# Patient Record
Sex: Male | Born: 1980
Health system: Southern US, Community
[De-identification: ages and names within clinical notes are randomized; demographics above are authoritative.]

## PROBLEM LIST (undated history)

## (undated) ENCOUNTER — Ambulatory Visit (HOSPITAL_COMMUNITY): Admission: EM | Disposition: A | Discharge status: Home / Self Care | Payer: Commercial Managed Care - PPO

## (undated) DIAGNOSIS — Z973 Presence of spectacles and contact lenses: Secondary | ICD-10-CM

## (undated) DIAGNOSIS — K8020 Calculus of gallbladder without cholecystitis without obstruction: Secondary | ICD-10-CM

## (undated) DIAGNOSIS — E079 Disorder of thyroid, unspecified: Secondary | ICD-10-CM

## (undated) DIAGNOSIS — I10 Essential (primary) hypertension: Secondary | ICD-10-CM

## (undated) DIAGNOSIS — G4733 Obstructive sleep apnea (adult) (pediatric): Secondary | ICD-10-CM

## (undated) DIAGNOSIS — T7840XA Allergy, unspecified, initial encounter: Secondary | ICD-10-CM

## (undated) DIAGNOSIS — L853 Xerosis cutis: Secondary | ICD-10-CM

## (undated) DIAGNOSIS — I509 Heart failure, unspecified: Secondary | ICD-10-CM

## (undated) HISTORY — PX: APPENDECTOMY: SHX54

## (undated) HISTORY — DX: Allergy, unspecified, initial encounter: T78.40XA

## (undated) HISTORY — DX: Disorder of thyroid, unspecified: E07.9

## (undated) HISTORY — DX: Presence of spectacles and contact lenses: Z97.3

## (undated) HISTORY — DX: Xerosis cutis: L85.3

## (undated) HISTORY — DX: Obstructive sleep apnea (adult) (pediatric): G47.33

---

## 2005-10-03 ENCOUNTER — Emergency Department (HOSPITAL_COMMUNITY): Admission: EM | Admit: 2005-10-03 | Discharge: 2005-10-03 | Payer: Self-pay | Admitting: *Deleted

## 2012-11-12 ENCOUNTER — Ambulatory Visit: Payer: Self-pay | Admitting: Family Medicine

## 2012-11-12 ENCOUNTER — Ambulatory Visit: Payer: Self-pay

## 2012-11-12 VITALS — BP 120/80 | HR 62 | Temp 98.2°F | Resp 16 | Ht 65.0 in | Wt 192.2 lb

## 2012-11-12 DIAGNOSIS — R110 Nausea: Secondary | ICD-10-CM

## 2012-11-12 DIAGNOSIS — R109 Unspecified abdominal pain: Secondary | ICD-10-CM

## 2012-11-12 DIAGNOSIS — R1011 Right upper quadrant pain: Secondary | ICD-10-CM

## 2012-11-12 DIAGNOSIS — K5900 Constipation, unspecified: Secondary | ICD-10-CM

## 2012-11-12 LAB — POCT CBC
Granulocyte percent: 86.8 %G — AB (ref 37–80)
HCT, POC: 48.8 % (ref 43.5–53.7)
Hemoglobin: 15.9 g/dL (ref 14.1–18.1)
MCHC: 32.6 g/dL (ref 31.8–35.4)
MCV: 95.2 fL (ref 80–97)
POC MID %: 4 %M (ref 0–12)
Platelet Count, POC: 262 10*3/uL (ref 142–424)
RBC: 5.13 M/uL (ref 4.69–6.13)

## 2012-11-12 LAB — POCT URINALYSIS DIPSTICK
Bilirubin, UA: NEGATIVE
Glucose, UA: NEGATIVE
Ketones, UA: NEGATIVE
Spec Grav, UA: 1.025
Urobilinogen, UA: 0.2
pH, UA: 8.5

## 2012-11-12 LAB — COMPREHENSIVE METABOLIC PANEL
AST: 74 U/L — ABNORMAL HIGH (ref 0–37)
Albumin: 5.1 g/dL (ref 3.5–5.2)
CO2: 30 mEq/L (ref 19–32)
Glucose, Bld: 140 mg/dL — ABNORMAL HIGH (ref 70–99)
Sodium: 141 mEq/L (ref 135–145)
Total Bilirubin: 0.5 mg/dL (ref 0.3–1.2)
Total Protein: 8.3 g/dL (ref 6.0–8.3)

## 2012-11-12 LAB — POCT UA - MICROSCOPIC ONLY
Crystals, Ur, HPF, POC: NEGATIVE
WBC, Ur, HPF, POC: NEGATIVE

## 2012-11-12 LAB — LIPASE: Lipase: 10 U/L (ref 0–75)

## 2012-11-12 MED ORDER — HYDROCODONE-ACETAMINOPHEN 5-325 MG PO TABS: 1.0000 | ORAL_TABLET | ORAL | Status: DC | PRN

## 2012-11-12 MED ORDER — PROMETHAZINE HCL 25 MG PO TABS: 25.0000 mg | ORAL_TABLET | Freq: Four times a day (QID) | ORAL | Status: DC | PRN

## 2012-11-12 NOTE — Patient Instructions (Addendum)
Take MiraLax one dose daily until stools are consistently loose, then take it on an as-needed basis  Drink plenty of fluids. Avoid alcohol, fried and fatty foods, and heavy meals for the next few days.  For the long term for your bowels try to add fruits and vegetables and especially leafy green vegetables to your diet. Cheese and bananas tend to constipate  Take the hydrocodone one every 4-6 hours if needed for bad pain. Remember that it can constipate you also, so use only when needed.  Take promethazine 25 mg one every 6 hours when needed for nausea and vomiting. Please be aware that it will make you drowsy.  In the event of more severe pain, fever, persistent vomiting, passing of blood in the stool or urine, or other major concerns return for recheck

## 2012-11-12 NOTE — Progress Notes (Signed)
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 He woke up in the night at about 4 AM with acute right flank pain. There is also some pain around in the right upper abdomen with nausea and vomiting. He took a laxative. He awakened with the pain couple times. He continues to hurt to the degree that he cried with it. He ran on Saturday. He did have about 3 drinks of wine last night. He said he has had a milder amount of this pain in the past, wonders whether it was associated with his drinking. His only abdominal surgery was a appendectomy.  Objective: Healthy-appearing young man in is some discomfort. He's got mild to moderate right CVA tenderness. Abdomen has occasional bowel sound present. It is soft, with mild to moderate tenderness in the right upper quadrant. Chest clear. Heart regular.  Assessment: Right flank and right epigastric and right upper quadrant pain with nausea and vomiting  Plan: Check CBC, urinalysis, and two-view abdomen. If not clear answer as to his problem we will also send off a C. met and amylase  Results for orders placed in visit on 11/12/12  POCT CBC      Result Value Range   WBC 10.4 (*) 4.6 - 10.2 K/uL   Lymph, poc 1.0  0.6 - 3.4   POC LYMPH PERCENT 9.2 (*) 10 - 50 %L   MID (cbc) 0.4  0 - 0.9   POC MID % 4.0  0 - 12 %M   POC Granulocyte 9.0 (*) 2 - 6.9   Granulocyte percent 86.8 (*) 37 - 80 %G   RBC 5.13  4.69 - 6.13 M/uL   Hemoglobin 15.9  14.1 - 18.1 g/dL   HCT, POC 65.7  84.6 - 53.7 %   MCV 95.2  80 - 97 fL   MCH, POC 31.0  27 - 31.2 pg   MCHC 32.6  31.8 - 35.4 g/dL   RDW, POC 96.2     Platelet Count, POC 262  142 - 424 K/uL   MPV 9.3  0 - 99.8 fL  POCT URINALYSIS DIPSTICK      Result Value Range   Color, UA yellow     Clarity, UA slightly yellow     Glucose, UA neg     Bilirubin, UA neg     Ketones, UA neg     Spec Grav, UA 1.025     Blood, UA trace     pH, UA 8.5     Protein, UA 100     Urobilinogen, UA 0.2     Nitrite,  UA neg     Leukocytes, UA Negative    POCT UA - MICROSCOPIC ONLY      Result Value Range   WBC, Ur, HPF, POC neg     RBC, urine, microscopic 0-3     Bacteria, U Microscopic neg     Mucus, UA positive     Epithelial cells, urine per micros 0-1     Crystals, Ur, HPF, POC neg     Casts, Ur, LPF, POC neg     Yeast, UA neg     [facility] reading (PRIMARY) by  Dr. Alwyn Ren Nonspecific ruq bowel gas.  Possible constipation

## 2014-12-09 ENCOUNTER — Encounter (HOSPITAL_COMMUNITY): Payer: Self-pay | Admitting: Emergency Medicine

## 2014-12-09 ENCOUNTER — Emergency Department (HOSPITAL_COMMUNITY): Payer: No Typology Code available for payment source

## 2014-12-09 ENCOUNTER — Emergency Department (HOSPITAL_COMMUNITY)
Admission: EM | Admit: 2014-12-09 | Discharge: 2014-12-09 | Disposition: A | Discharge status: Home / Self Care | Payer: No Typology Code available for payment source | Attending: Emergency Medicine | Admitting: Emergency Medicine

## 2014-12-09 DIAGNOSIS — R0981 Nasal congestion: Secondary | ICD-10-CM | POA: Insufficient documentation

## 2014-12-09 DIAGNOSIS — R0789 Other chest pain: Secondary | ICD-10-CM | POA: Insufficient documentation

## 2014-12-09 DIAGNOSIS — R61 Generalized hyperhidrosis: Secondary | ICD-10-CM | POA: Insufficient documentation

## 2014-12-09 DIAGNOSIS — R200 Anesthesia of skin: Secondary | ICD-10-CM | POA: Insufficient documentation

## 2014-12-09 DIAGNOSIS — R110 Nausea: Secondary | ICD-10-CM | POA: Insufficient documentation

## 2014-12-09 DIAGNOSIS — R0602 Shortness of breath: Secondary | ICD-10-CM | POA: Insufficient documentation

## 2014-12-09 DIAGNOSIS — R079 Chest pain, unspecified: Secondary | ICD-10-CM

## 2014-12-09 DIAGNOSIS — R42 Dizziness and giddiness: Secondary | ICD-10-CM | POA: Diagnosis not present

## 2014-12-09 DIAGNOSIS — H6691 Otitis media, unspecified, right ear: Secondary | ICD-10-CM | POA: Insufficient documentation

## 2014-12-09 LAB — I-STAT TROPONIN, ED: Troponin i, poc: 0.01 ng/mL (ref 0.00–0.08)

## 2014-12-09 MED ORDER — AMOXICILLIN 500 MG PO CAPS: 1000.0000 mg | ORAL_CAPSULE | Freq: Two times a day (BID) | ORAL | Status: DC

## 2014-12-09 MED ORDER — IBUPROFEN 800 MG PO TABS
800.0000 mg | ORAL_TABLET | Freq: Once | ORAL | Status: AC
  Administered 2014-12-09: 800 mg via ORAL
  Filled 2014-12-09: qty 1

## 2014-12-09 MED ORDER — MECLIZINE HCL 50 MG PO TABS: 50.0000 mg | ORAL_TABLET | Freq: Three times a day (TID) | ORAL | Status: DC | PRN

## 2014-12-09 MED ORDER — DIPHENHYDRAMINE HCL 25 MG PO CAPS
25.0000 mg | ORAL_CAPSULE | Freq: Once | ORAL | Status: AC
  Administered 2014-12-09: 25 mg via ORAL
  Filled 2014-12-09: qty 1

## 2014-12-09 MED ORDER — METOCLOPRAMIDE HCL 10 MG PO TABS
5.0000 mg | ORAL_TABLET | Freq: Once | ORAL | Status: AC
  Administered 2014-12-09: 5 mg via ORAL
  Filled 2014-12-09: qty 1

## 2014-12-09 MED ORDER — MECLIZINE HCL 25 MG PO TABS
25.0000 mg | ORAL_TABLET | Freq: Once | ORAL | Status: AC
  Administered 2014-12-09: 25 mg via ORAL
  Filled 2014-12-09: qty 1

## 2014-12-09 MED ORDER — ACETAMINOPHEN 500 MG PO TABS
1000.0000 mg | ORAL_TABLET | Freq: Once | ORAL | Status: AC
  Administered 2014-12-09: 1000 mg via ORAL
  Filled 2014-12-09: qty 2

## 2014-12-09 NOTE — ED Provider Notes (Signed)
CSN: [id number]   Arrival date & time 12/09/14 0243  History  By signing my name below, I, Bethel Born, attest that this documentation has been prepared under the direction and in the presence of Melene Plan, DO. Electronically Signed: Bethel Born, ED Scribe. 12/09/2014. 3:47 AM.  Chief Complaint  Patient presents with  . Chest Pain    HPI The history is provided by the patient. No language interpreter was used.   Bobby Kelly is a 34 y.o. male who presents to the Emergency Department complaining of intermittent left-sided chest pain with onset 24 hours ago. The pain is described as "like something is right there" and the episodes last for up to 1 minute before resolving spontaneously. The pain occurs with no known trigger or modifying factors. Associated symptoms include numbness in the first 2 fingers of the left hand, nausea, dizziness, and sweating. The pt looked up his symptoms online and has been unable to sleep since. He states that he is unsure if his SOB is real or occuring because he read that it was a symptom of a heart attack.  Pt denies cough, chest congestion, ear pain, fever, chills, neck pain, back pain, vomiting, and LE swelling. No history of tobacco use. He has no PCP or known medical history. No family history of MI. No recent long travel by car or plane, surgery or hospitalization, or bone fracture. No recent trauma or injury.     History reviewed. No pertinent past medical history.  Past Surgical History  Procedure Laterality Date  . Appendectomy      No family history on file.  Social History  Substance Use Topics  . Smoking status: Never Smoker   . Smokeless tobacco: None  . Alcohol Use: Yes     Review of Systems  Constitutional: Positive for diaphoresis. Negative for fever and chills.  HENT: Positive for congestion (nasal). Negative for facial swelling.   Eyes: Negative for discharge and visual disturbance.  Respiratory: Positive for shortness of  breath. Negative for cough.   Cardiovascular: Positive for chest pain. Negative for palpitations and leg swelling.  Gastrointestinal: Positive for nausea. Negative for vomiting, abdominal pain and diarrhea.  Musculoskeletal: Negative for myalgias and arthralgias.  Skin: Negative for color change and rash.  Neurological: Positive for dizziness and numbness. Negative for tremors, syncope and headaches.  Psychiatric/Behavioral: Negative for confusion and dysphoric mood.   Home Medications   Prior to Admission medications   Medication Sig Start Date End Date Taking? Authorizing Provider  amoxicillin (AMOXIL) 500 MG capsule Take 2 capsules (1,000 mg total) by mouth 2 (two) times daily. 12/09/14   Melene Plan, DO  meclizine (ANTIVERT) 50 MG tablet Take 1 tablet (50 mg total) by mouth 3 (three) times daily as needed. 12/09/14   Melene Plan, DO    Allergies  Review of patient's allergies indicates no known allergies.  Triage Vitals: BP 165/109 mmHg  Pulse 96  Temp(Src) 97.7 F (36.5 C) (Oral)  Resp 16  Ht 5\' 5"  (1.651 m)  Wt 217 lb (98.431 kg)  BMI 36.11 kg/m2  SpO2 97%  Physical Exam  Constitutional: He is oriented to person, place, and time. He appears well-developed and well-nourished.  HENT:  Head: Normocephalic and atraumatic.  Purulent effusion to lower and post aspect of the right TM. No oropharyngeal erythema  Swollen turbinates  Eyes: EOM are normal. Pupils are equal, round, and reactive to light.  Neck: Normal range of motion. Neck supple. No JVD present.  Cardiovascular: Normal  rate and regular rhythm.  Exam reveals no gallop and no friction rub.   No murmur heard. Pulmonary/Chest: No respiratory distress. He has no wheezes.  Reproducible tenderness at the left chest wall just under the left pectoral muscle CTAB  Abdominal: Soft. He exhibits no distension. There is no tenderness. There is no rebound and no guarding.  Musculoskeletal: Normal range of motion.  Neurological: He  is alert and oriented to person, place, and time.  Skin: No rash noted. No pallor.  Psychiatric: He has a normal mood and affect. His behavior is normal.  Nursing note and vitals reviewed.   ED Course  Procedures   DIAGNOSTIC STUDIES: Oxygen Saturation is 97% on RA, normal by my interpretation.    COORDINATION OF CARE: 3:05 AM Discussed treatment plan which includes CXR, EKG, Tylenol, and ibuprofen with pt at bedside and pt agreed to plan.  Labs Reviewed  Rosezena Sensor, ED    Imaging Review Dg Chest 2 View  12/09/2014  CLINICAL DATA:  Left-sided chest pain and dyspnea, onset tonight EXAM: CHEST  2 VIEW COMPARISON:  None. FINDINGS: There is a shallow inspiration with mild crowding of the basilar markings. No confluent airspace opacity. No effusions. Upper normal heart size. Hilar and mediastinal contours are unremarkable. IMPRESSION: Shallow inspiration.  No acute cardiopulmonary abnormality. Electronically Signed   By: Ellery Plunk M.D.   On: 12/09/2014 04:14    I personally reviewed and evaluated these images and lab results as a part of my medical decision-making.   EKG Interpretation  Date/Time:  Tuesday December 09 2014 02:49:07 EDT Ventricular Rate:  99 PR Interval:  178 QRS Duration: 80 QT Interval:  386 QTC Calculation: 495 R Axis:   80 Text Interpretation:  Normal sinus rhythm Prolonged QT Abnormal ECG No old tracing to compare Confirmed by Shalin Linders MD, DANIEL [phone]) on 12/09/2014 2:52:42 AM    MDM   Final diagnoses:  Chest pain, unspecified chest pain type  Acute right otitis media, recurrence not specified, unspecified otitis media type  Vertigo    Patient is a 34 y.o. male who presents with chest pain, vertigo congestion.  This started a couple days ago and worsening today. Chest pain is intermittent is sharp left-sided last about a minute and time. Patient is unsure what makes it better or worse. Also having some left hand tingling from time to time  unrelated to his chest pain. Also having vertigo worse when he lies back flat.   On exam patient with right otitis. Chest x-ray and EKG unremarkable. Patient with no risk factors for cardiac disease. Hypertensive on the ED suggested that he follow-up with PCP in one week for recheck. Will give prescription for amoxicillin and have him fill if he becomes febrile or has worsening symptoms. Symptoms completely resolved in the ED with Reglan and meclizine.  5:14 AM:  I have discussed the diagnosis/risks/treatment options with the patient and family and believe the pt to be eligible for discharge home to follow-up with PCP. We also discussed returning to the ED immediately if new or worsening sx occur. We discussed the sx which are most concerning (e.g., sudden worsening pain, fever, inability to tolerate by mouth) that necessitate immediate return. Medications administered to the patient during their visit and any new prescriptions provided to the patient are listed below.  Medications given during this visit Medications  ibuprofen (ADVIL,MOTRIN) tablet 800 mg (800 mg Oral Given 12/09/14 0317)  acetaminophen (TYLENOL) tablet 1,000 mg (1,000 mg Oral Given 12/09/14 0316)  meclizine (ANTIVERT) tablet 25 mg (25 mg Oral Given 12/09/14 0317)  metoCLOPramide (REGLAN) tablet 5 mg (5 mg Oral Given 12/09/14 0353)  diphenhydrAMINE (BENADRYL) capsule 25 mg (25 mg Oral Given 12/09/14 0353)    Discharge Medication List as of 12/09/2014  4:20 AM    START taking these medications   Details  amoxicillin (AMOXIL) 500 MG capsule Take 2 capsules (1,000 mg total) by mouth 2 (two) times daily., Starting 12/09/2014, Until Discontinued, Print    meclizine (ANTIVERT) 50 MG tablet Take 1 tablet (50 mg total) by mouth 3 (three) times daily as needed., Starting 12/09/2014, Until Discontinued, Print        The patient appears reasonably screen and/or stabilized for discharge and I doubt any other medical condition or other [facility]  requiring further screening, evaluation, or treatment in the ED at this time prior to discharge.      I personally performed the services described in this documentation, which was scribed in my presence. The recorded information has been reviewed and is accurate.    Melene Plan, DO 12/09/14 [phone]

## 2014-12-09 NOTE — Discharge Instructions (Signed)
Follow up with a Family doctor.  Take the antibiotic in two days if continued symptoms or if you get a fever or ear pain.  Dizziness Dizziness is a common problem. It makes you feel unsteady or lightheaded. You may feel like you are about to pass out (faint). Dizziness can lead to injury if you stumble or fall. Anyone can get dizzy, but dizziness is more common in older adults. This condition can be caused by a number of things, including:  Medicines.  Dehydration.  Illness. HOME CARE Following these instructions may help with your condition: Eating and Drinking  Drink enough fluid to keep your pee (urine) clear or pale yellow. This helps to keep you from getting dehydrated. Try to drink more clear fluids, such as water.  Do not drink alcohol.  Limit how much caffeine you drink or eat if told by your doctor.  Limit how much salt you drink or eat if told by your doctor. Activity  Avoid making quick movements.  When you stand up from sitting in a chair, steady yourself until you feel okay.  In the morning, first sit up on the side of the bed. When you feel okay, stand slowly while you hold onto something. Do this until you know that your balance is fine.  Move your legs often if you need to stand in one place for a long time. Tighten and relax your muscles in your legs while you are standing.  Do not drive or use heavy machinery if you feel dizzy.  Avoid bending down if you feel dizzy. Place items in your home so that they are easy for you to reach without leaning over. Lifestyle  Do not use any tobacco products, including cigarettes, chewing tobacco, or electronic cigarettes. If you need help quitting, ask your doctor.  Try to lower your stress level, such as with yoga or meditation. Talk with your doctor if you need help. General Instructions  Watch your dizziness for any changes.  Take medicines only as told by your doctor. Talk with your doctor if you think that your  dizziness is caused by a medicine that you are taking.  Tell a friend or a family member that you are feeling dizzy. If he or she notices any changes in your behavior, have this person call your doctor.  Keep all follow-up visits as told by your doctor. This is important. GET HELP IF:  Your dizziness does not go away.  Your dizziness or light-headedness gets worse.  You feel sick to your stomach (nauseous).  You have trouble hearing.  You have new symptoms.  You are unsteady on your feet or you feel like the room is spinning. GET HELP RIGHT AWAY IF:  You throw up (vomit) or have diarrhea and are unable to eat or drink anything.  You have trouble:  Talking.  Walking.  Swallowing.  Using your arms, hands, or legs.  You feel generally weak.  You are not thinking clearly or you have trouble forming sentences. It may take a friend or family member to notice this.  You have:  Chest pain.  Pain in your belly (abdomen).  Shortness of breath.  Sweating.  Your vision changes.  You are bleeding.  You have a headache.  You have neck pain or a stiff neck.  You have a fever.   This information is not intended to replace advice given to you by your health care provider. Make sure you discuss any questions you have with your  health care provider.   Document Released: 01/13/2011 Document Revised: 06/10/2014 Document Reviewed: 01/20/2014 Elsevier Interactive Patient Education [employer]2016 Elsevier Inc.

## 2014-12-09 NOTE — ED Notes (Signed)
Pt reports intermittent cp all day today lasting just a couple mins each time. Pt also reports tingling L hand, diaphoresis and dizziness.

## 2014-12-09 NOTE — ED Notes (Signed)
Reviewed discharge instructions and prescription medications with patient. Patient verbalized understanding, VSS.

## 2016-02-08 DIAGNOSIS — K8020 Calculus of gallbladder without cholecystitis without obstruction: Secondary | ICD-10-CM

## 2016-02-08 DIAGNOSIS — I509 Heart failure, unspecified: Secondary | ICD-10-CM

## 2016-02-08 HISTORY — DX: Heart failure, unspecified: I50.9

## 2016-02-08 HISTORY — DX: Calculus of gallbladder without cholecystitis without obstruction: K80.20

## 2016-04-28 ENCOUNTER — Ambulatory Visit (INDEPENDENT_AMBULATORY_CARE_PROVIDER_SITE_OTHER): Payer: No Typology Code available for payment source | Admitting: Medical

## 2016-04-28 ENCOUNTER — Encounter: Payer: Self-pay | Admitting: Medical

## 2016-04-28 VITALS — BP 138/82 | HR 82 | Ht 64.0 in | Wt 224.4 lb

## 2016-04-28 DIAGNOSIS — R109 Unspecified abdominal pain: Secondary | ICD-10-CM

## 2016-04-28 DIAGNOSIS — K5900 Constipation, unspecified: Secondary | ICD-10-CM | POA: Diagnosis not present

## 2016-04-28 DIAGNOSIS — R142 Eructation: Secondary | ICD-10-CM

## 2016-04-28 DIAGNOSIS — Z8639 Personal history of other endocrine, nutritional and metabolic disease: Secondary | ICD-10-CM | POA: Diagnosis not present

## 2016-04-28 DIAGNOSIS — R202 Paresthesia of skin: Secondary | ICD-10-CM

## 2016-04-28 DIAGNOSIS — R140 Abdominal distension (gaseous): Secondary | ICD-10-CM | POA: Diagnosis not present

## 2016-04-28 LAB — POCT URINALYSIS DIPSTICK
Bilirubin, UA: NEGATIVE
Blood, UA: NEGATIVE
Glucose, UA: NEGATIVE
Ketones, UA: NEGATIVE
LEUKOCYTES UA: NEGATIVE
NITRITE UA: NEGATIVE
PH UA: 7 (ref 5.0–8.0)
PROTEIN UA: POSITIVE
Spec Grav, UA: 1.02 (ref 1.030–1.035)
UROBILINOGEN UA: NEGATIVE (ref ?–2.0)

## 2016-04-28 LAB — CBC
HEMATOCRIT: 50.3 % — AB (ref 38.5–50.0)
Hemoglobin: 17.5 g/dL — ABNORMAL HIGH (ref 13.2–17.1)
MCH: 31.5 pg (ref 27.0–33.0)
MCHC: 34.8 g/dL (ref 32.0–36.0)
MCV: 90.5 fL (ref 80.0–100.0)
MPV: 9.8 fL (ref 7.5–12.5)
PLATELETS: 277 10*3/uL (ref 140–400)
RBC: 5.56 MIL/uL (ref 4.20–5.80)
RDW: 14.1 % (ref 11.0–15.0)
WBC: 10.3 10*3/uL (ref 4.0–10.5)

## 2016-04-28 LAB — COMPREHENSIVE METABOLIC PANEL
ALK PHOS: 83 U/L (ref 40–115)
ALT: 38 U/L (ref 9–46)
AST: 28 U/L (ref 10–40)
Albumin: 4.5 g/dL (ref 3.6–5.1)
BILIRUBIN TOTAL: 0.5 mg/dL (ref 0.2–1.2)
BUN: 14 mg/dL (ref 7–25)
CO2: 26 mmol/L (ref 20–31)
Calcium: 9.1 mg/dL (ref 8.6–10.3)
Chloride: 102 mmol/L (ref 98–110)
Creat: 0.83 mg/dL (ref 0.60–1.35)
GLUCOSE: 92 mg/dL (ref 65–99)
Potassium: 3.7 mmol/L (ref 3.5–5.3)
SODIUM: 138 mmol/L (ref 135–146)
Total Protein: 7.6 g/dL (ref 6.1–8.1)

## 2016-04-28 LAB — TSH: TSH: 1.58 mIU/L (ref 0.40–4.50)

## 2016-04-28 LAB — T4, FREE: Free T4: 1.4 ng/dL (ref 0.8–1.8)

## 2016-04-28 NOTE — Progress Notes (Addendum)
Subjective: Chief Complaint  Patient presents with  . New Patient (Initial Visit)    stomach pain, numbness in  in index finger   Here to establish care.   Was seeing urgent care prior.     He notes several concerns.  Since last Wednesday has been constipation, having pains and cramps in stomach, back pain attributed to the abdominal pain.  Has tried some miralax OTC.  Prior to last week would have several BMs daily that he would describe as normal.   Drinks 1-2 water bottles daily, drinks some soda, maybe 1 daily, drinks sweet tea some.  Doesn't get a lot of fiber in diet, but typically bread with burger or roll with lunch.  No blood in stool.   No stool urgency prior this past week.  Tried miralax daily the last few days.   This helped some.  Last BM was this morning.  Has felt gassy, bloated, burping a lot.   Eats a lot of spicy foods.    Last few days has had some numbness in index and thumbs and forearms, intermittent.  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  No neck pain.   No prior similar.  Is a draftsman, on the computer all day.  No recent injury, trauma.  No joint swelling or joint pain.   No other aggravating or relieving factors. No other complaint.   Past Medical History:  Diagnosis Date  . Allergy   . Dry skin    nasal folds  . Thyroid disease    hyperactive in high school, had oral therapy.    . Wears contact lenses     Past Surgical History:  Procedure Laterality Date  . APPENDECTOMY      Social History   Social History  . Marital status: Married    Spouse name: N/A  . Number of children: N/A  . Years of education: N/A   Occupational History  . Not on file.   Social History Main Topics  . Smoking status: Never Smoker  . Smokeless tobacco: Never Used  . Alcohol use 3.6 oz/week    2 Glasses of wine, 2 Cans of beer, 2 Shots of liquor per week     Comment: occ  . Drug use: No  . Sexual activity: Not on file   Other Topics Concern  . Not on file    Social History Narrative  . No narrative on file    History reviewed. No pertinent family history.  No current outpatient prescriptions on file.  No Known Allergies  ROS as in subjective  Objective: BP 138/82   Pulse 82   Ht 5\' 4"  (1.626 m)   Wt 224 lb 6.4 oz (101.8 kg)   SpO2 98%   BMI 38.52 kg/m   General appearance: alert, no distress, WD/WN HEENT: normocephalic, sclerae anicteric, TMs pearly, nares patent, no discharge or erythema, pharynx normal Oral cavity: MMM, no lesions Neck: supple, no lymphadenopathy, no thyromegaly, no masses Heart: RRR, normal S1, S2, no murmurs Lungs: CTA bilaterally, no wheezes, rhonchi, or rales Abdomen: +bs, soft, RLQ surgical scar, non tender, non distended, no masses, no hepatomegaly, no splenomegaly Pulses: 2+ symmetric, upper and lower extremities, normal cap refill MSK: arms, wrists, fingers nontender, normal ROM, no swelling or deformity Neuro: +phalens on left, but otherwise DTRs, strength, sensation intact UE and LE    Assessment: Encounter Diagnoses  Name Primary?  . Stomach pain Yes  . Constipation, unspecified constipation type   .  History of thyroid disorder   . Bloating   . Belching     Plan: Given constipation symptoms, could be transient but he does have hx/o thyroid disease, not checked in years.  Labs today.  Begin the following recommendations  Recommendations  We are checking labs today and will call with results within a few days  For constipation, use Miralax, 1 cap full powder in a glass of water or other beverage HOURLY until you have relief or moderate bowel movement  Once you get some relief, switch to 1 cap full daily for a week or two  If you want to try a different regimen, try Milk of Magnesia over the counter  Try and get 25g or more of fiber daily in the diet  Increase your water intake  Avoid lots of meat and cheese  paresthesias, possible mild early CTS:  Regarding the hands, try  using night time carpal tunnel splints from the drug store.  Use this nightly for a few weeks and see if it calms down the symptoms  Nareg was seen today for new patient (initial visit).  Diagnoses and all orders for this visit:  Stomach pain -     Urinalysis Dipstick -     TSH -     Comprehensive metabolic panel -     T4, free -     CBC  Constipation, unspecified constipation type -     TSH -     Comprehensive metabolic panel -     T4, free -     CBC  History of thyroid disorder -     TSH -     Comprehensive metabolic panel -     T4, free -     CBC  Bloating -     TSH -     Comprehensive metabolic panel -     T4, free -     CBC  Belching -     TSH -     Comprehensive metabolic panel -     T4, free -     CBC  Paresthesia  f/u pending labs

## 2016-04-28 NOTE — Patient Instructions (Addendum)
Recommendations  We are checking labs today and will call with results within a few days  For constipation, use Miralax, 1 cap full powder in a glass of water or other beverage HOURLY until you have relief or moderate bowel movement  Once you get some relief, switch to 1 cap full daily for a week or two  If you want to try a different regimen, try Milk of Magnesia over the counter  Try and get 25g or more of fiber daily in the diet  Increase your water intake  Avoid lots of meat and cheese  Regarding the hands, try using night time carpal tunnel splints from the drug store.  Use this nightly for a few weeks and see if it calms down the symptoms   Constipation, Adult Constipation is when a person has fewer bowel movements in a week than normal, has difficulty having a bowel movement, or has stools that are dry, hard, or larger than normal. Constipation may be caused by an underlying condition. It may become worse with age if a person takes certain medicines and does not take in enough fluids. Follow these instructions at home: Eating and drinking    Eat foods that have a lot of fiber, such as fresh fruits and vegetables, whole grains, and beans.  Limit foods that are high in fat, low in fiber, or overly processed, such as french fries, hamburgers, cookies, candies, and soda.  Drink enough fluid to keep your urine clear or pale yellow. General instructions   Exercise regularly or as told by your health care provider.  Go to the restroom when you have the urge to go. Do not hold it in.  Take over-the-counter and prescription medicines only as told by your health care provider. These include any fiber supplements.  Practice pelvic floor retraining exercises, such as deep breathing while relaxing the lower abdomen and pelvic floor relaxation during bowel movements.  Watch your condition for any changes.  Keep all follow-up visits as told by your health care provider. This is  important. Contact a health care provider if:  You have pain that gets worse.  You have a fever.  You do not have a bowel movement after 4 days.  You vomit.  You are not hungry.  You lose weight.  You are bleeding from the anus.  You have thin, pencil-like stools. Get help right away if:  You have a fever and your symptoms suddenly get worse.  You leak stool or have blood in your stool.  Your abdomen is bloated.  You have severe pain in your abdomen.  You feel dizzy or you faint. This information is not intended to replace advice given to you by your health care provider. Make sure you discuss any questions you have with your health care provider. Document Released: 10/23/2003 Document Revised: 08/14/2015 Document Reviewed: 07/15/2015 Elsevier Interactive Patient Education  2017 Elsevier Inc.   Carpal Tunnel Syndrome Carpal tunnel syndrome is a condition that causes pain in your hand and arm. The carpal tunnel is a narrow area that is on the palm side of your wrist. Repeated wrist motion or certain diseases may cause swelling in the tunnel. This swelling can pinch the main nerve in the wrist (median nerve). Follow these instructions at home: If you have a splint:  Wear it as told by your doctor. Remove it only as told by your doctor. Loosen the splint if your fingers: Become numb and tingle. Turn blue and cold. Keep the splint  clean and dry. General instructions  Take over-the-counter and prescription medicines only as told by your doctor. Rest your wrist from any activity that may be causing your pain. If needed, talk to your employer about changes that can be made in your work, such as getting a wrist pad to use while typing. If directed, apply ice to the painful area: Put ice in a plastic bag. Place a towel between your skin and the bag. Leave the ice on for 20 minutes, 2-3 times per day. Keep all follow-up visits as told by your doctor. This is important. Do  any exercises as told by your doctor, physical therapist, or occupational therapist. Contact a doctor if: You have new symptoms. Medicine does not help your pain. Your symptoms get worse. This information is not intended to replace advice given to you by your health care provider. Make sure you discuss any questions you have with your health care provider. Document Released: 01/13/2011 Document Revised: 07/02/2015 Document Reviewed: 06/11/2014 Elsevier Interactive Patient Education  2017 [employer].

## 2016-08-16 ENCOUNTER — Encounter: Payer: Self-pay | Admitting: Family Medicine

## 2016-08-16 ENCOUNTER — Ambulatory Visit (INDEPENDENT_AMBULATORY_CARE_PROVIDER_SITE_OTHER): Payer: No Typology Code available for payment source | Admitting: Family Medicine

## 2016-08-16 VITALS — BP 160/100 | HR 99 | Wt 229.0 lb

## 2016-08-16 DIAGNOSIS — R000 Tachycardia, unspecified: Secondary | ICD-10-CM

## 2016-08-16 NOTE — Progress Notes (Signed)
   Subjective:    Patient ID: Bobby Kelly, male    DOB: August 07, 1980, 36 y.o.   MRN: [id number]  HPI He complains of having the onset of rapid heart rate on Sunday when he woke up. He did check this with his fit bit and it was in the 100-108 range. He also complained of chest pain that lasted the whole day. No associated shortness of breath, diaphoresis, change with motion or with breathing. Earlier today he noted a heart rate of the 130 with minimal physical activity. He again does complains of some chest discomfort and occasional dizziness. He is not on a decongestant and does drink only 3 cups of coffee usually in the morning. Otherwise he is on no medications.   Review of Systems     Objective:   Physical Exam Alert and in no distress. Cardiac exam does show a rate resting of around 100. Lungs are clear to auscultation. Reflexes are normal. EKG does show a rate of around 95 and prolonged QT. Recent blood work was reviewed.      Assessment & Plan:  Tachycardia - Plan: EKG 12-Lead, Ambulatory referral to Cardiology No good reason why he should have a resting heart rate in the 100 range and because of this, I will refer him to cardiology.

## 2016-08-17 ENCOUNTER — Telehealth: Payer: Self-pay | Admitting: Family Medicine

## 2016-08-17 NOTE — Telephone Encounter (Signed)
Pt states it comes and go and next time is comes on, he will go to ER.

## 2016-08-17 NOTE — Telephone Encounter (Signed)
The only thing we can offer his for him to go to the ER and get checked out

## 2016-08-17 NOTE — Telephone Encounter (Signed)
Pt called and stated he is not feeling any better. Pt saw JCL yesterday. Pt states that he feels shaky, lightheaded and feels like he is going to faint. Please advise pt. He can be reached at [phone].

## 2016-08-28 NOTE — Progress Notes (Signed)
Electrophysiology Office Note   Date:  08/29/2016   ID:  Bobby Kelly, DOB Aug 05, 1980, MRN [id number]  PCP:  Bobby Canavan, PA-C  Cardiologist:  none Primary Electrophysiologist:  Bobby Kwasny Bobby Loa, MD    Chief Complaint  Patient presents with  . New Patient (Initial Visit)    Tachycardia  . Shortness of Breath  . Chest Pain     History of Present Illness: Bobby Kelly is a 36 y.o. male who is being seen today for the evaluation of palpitations at the request of Bobby Nian, MD. Presenting today for electrophysiology evaluation. Had rapid heart rate when she awoke eariler this month. HR on fit bit 100-108. Complained of chset pain that lasted the whole day. HR was in the 130s that day with minimal activity. His heart rate is remained elevated since that time. He is recently put on hydrochlorothiazide for blood pressure at his primary physician's office. He also endorses chest pain. The pain is in the center of his chest. The pain occurs at times when he exerts himself and is at times improved with rest. The pain also occurs when he is simply resting, such as waking up from sleep. He did say that he was treated for hyperthyroidism in the past. This has not been checked since he was in high school.  Today, he denies symptoms of palpitations, chest pain, shortness of breath, orthopnea, PND, lower extremity edema, claudication, dizziness, presyncope, syncope, bleeding, or neurologic sequela. The patient is tolerating medications without difficulties.    Past Medical History:  Diagnosis Date  . Allergy   . Dry skin    nasal folds  . Thyroid disease    hyperactive in high school, had oral therapy.    . Wears contact lenses    Past Surgical History:  Procedure Laterality Date  . APPENDECTOMY       Current Outpatient Prescriptions  Medication Sig Dispense Refill  . amLODipine (NORVASC) 5 MG tablet Take 1 tablet (5 mg total) by mouth daily. 90 tablet 3  .  hydrochlorothiazide (HYDRODIURIL) 25 MG tablet Take 1 tablet (25 mg total) by mouth daily. 90 tablet 3   No current facility-administered medications for this visit.     Allergies:   Patient has no known allergies.   Social History:  The patient  reports that he has never smoked. He has never used smokeless tobacco. He reports that he drinks about 3.6 oz of alcohol per week . He reports that he does not use drugs.   Family History:  The patient's family history includes Hyperlipidemia in his father; Hypertension in his mother; Pancreatic cancer in his maternal grandmother.    ROS:  Please see the history of present illness.   Otherwise, review of systems is positive for Sweats, chest pain, shortness of breath, palpitations, anxiety, dizziness.   All other systems are reviewed and negative.    PHYSICAL EXAM: VS:  BP (!) 144/120 (BP Location: Right Arm)   Pulse 97   Ht 5\' 5"  (1.651 m)   Wt 221 lb 6.4 oz (100.4 kg)   SpO2 96%   BMI 36.84 kg/m  , BMI Body mass index is 36.84 kg/m. GEN: Well nourished, well developed, in no acute distress  HEENT: normal  Neck: no JVD, carotid bruits, or masses Cardiac: RRR; no murmurs, rubs, or gallops,no edema  Respiratory:  clear to auscultation bilaterally, normal work of breathing GI: soft, nontender, nondistended, + BS MS: no deformity or atrophy  Skin: warm and  dry Neuro:  Strength and sensation are intact Psych: euthymic mood, full affect  EKG:  EKG is not ordered today. Personal review of the ekg ordered shows sinus rhythm, rate 95, QTc 517  Recent Labs: 04/28/2016: ALT 38; BUN 14; Creat 0.83; Hemoglobin 17.5; Platelets 277; Potassium 3.7; Sodium 138; TSH 1.58    Lipid Panel  No results found for: CHOL, TRIG, HDL, CHOLHDL, VLDL, LDLCALC, LDLDIRECT   Wt Readings from Last 3 Encounters:  08/29/16 221 lb 6.4 oz (100.4 kg)  08/16/16 229 lb (103.9 kg)  04/28/16 224 lb 6.4 oz (101.8 kg)      Other studies Reviewed: Additional  studies/ records that were reviewed today include: PCP notes   ASSESSMENT AND PLAN:  1.  Palpitations: He is in sinus tachycardia at this point. It is unclear as to the cause of his sinus tachycardia, or if he has any further rhythm abnormalities. We'll plan to fit him with a 48 hour monitor. He also endorses being treated for hyperthyroidism in the past, but has not had this checked since he was in high school. We Bobby Kelly recheck a TSH today.  2. Chest pain: Pain has both typical and atypical features. He does say that his pain is improved with rest and worsened with exertion. We'll order a rest stress Myoview.  3. Hypertension: Diastolic blood pressure is quite elevated today, and has had been elevated in the past. He does have headaches at times. Checking a TSH today, as hyperthyroid can contribute. We'll increase his HCTZ to 25 mg, and had 5 mg of Norvasc.  Current medicines are reviewed at length with the patient today.   The patient does not have concerns regarding his medicines.  The following changes were made today:  Start Norvasc 5 mg, increase HCTZ to 25 mg  Labs/ tests ordered today include:  Orders Placed This Encounter  Procedures  . TSH  . Myocardial Perfusion Imaging  . Holter monitor - 48 hour     Disposition:   FU with Bobby Kelly pending myoview, holter  Signed, Bobby Kelly Bobby Loa, MD  08/29/2016 9:38 AM     [facility] [address] [place] [address] [phone] (office) [phone] (fax)

## 2016-08-29 ENCOUNTER — Encounter: Payer: Self-pay | Admitting: Cardiology

## 2016-08-29 ENCOUNTER — Ambulatory Visit (INDEPENDENT_AMBULATORY_CARE_PROVIDER_SITE_OTHER): Payer: No Typology Code available for payment source | Admitting: Cardiology

## 2016-08-29 ENCOUNTER — Encounter (INDEPENDENT_AMBULATORY_CARE_PROVIDER_SITE_OTHER): Payer: Self-pay

## 2016-08-29 VITALS — BP 144/120 | HR 97 | Ht 65.0 in | Wt 221.4 lb

## 2016-08-29 DIAGNOSIS — I10 Essential (primary) hypertension: Secondary | ICD-10-CM

## 2016-08-29 DIAGNOSIS — R079 Chest pain, unspecified: Secondary | ICD-10-CM

## 2016-08-29 DIAGNOSIS — R000 Tachycardia, unspecified: Secondary | ICD-10-CM

## 2016-08-29 LAB — TSH: TSH: 1.13 u[IU]/mL (ref 0.450–4.500)

## 2016-08-29 MED ORDER — HYDROCHLOROTHIAZIDE 25 MG PO TABS: 25.0000 mg | ORAL_TABLET | Freq: Every day | ORAL | 3 refills | Status: DC

## 2016-08-29 MED ORDER — AMLODIPINE BESYLATE 5 MG PO TABS: 5.0000 mg | ORAL_TABLET | Freq: Every day | ORAL | 3 refills | Status: DC

## 2016-08-29 NOTE — Patient Instructions (Addendum)
Medication Instructions:   Your physician has recommended you make the following change in your medication:  1. INCREASE Hydrochlorothiazide to 25 mg daily 2. START Amlodipine 5 mg daily  - If you need a refill on your cardiac medications before your next appointment, please call your pharmacy.   Labwork:  TSH today  Testing/Procedures: Your physician has requested that you have a lexiscan myoview. For further information please visit [email or web address] Please follow instruction sheet, as given.  Your physician has recommended that you wear a 48 hour holter monitor. Holter monitors are medical devices that record the heart's electrical activity. Doctors most often use these monitors to diagnose arrhythmias. Arrhythmias are problems with the speed or rhythm of the heartbeat. The monitor is a small, portable device. You can wear one while you do your normal daily activities. This is usually used to diagnose what is causing palpitations/syncope (passing out).  Follow-Up:  Follow up to be determined based on the above testing.  The office will call you with results.  Thank you for choosing [facility]!!   Dory Horn, RN [phone]  Any Other Special Instructions Will Be Listed Below (If Applicable).  Please monitor your blood pressure at home. Please call in a week if it still remains elevated.   Amlodipine tablets What is this medicine? AMLODIPINE (am LOE di peen) is a calcium-channel blocker. It affects the amount of calcium found in your heart and muscle cells. This relaxes your blood vessels, which can reduce the amount of work the heart has to do. This medicine is used to lower high blood pressure. It is also used to prevent chest pain. This medicine may be used for other purposes; ask your health care provider or pharmacist if you have questions. COMMON BRAND NAME(S): Norvasc What should I tell my health care provider before I take this medicine? They need to know  if you have any of these conditions: -heart problems like heart failure or aortic stenosis -liver disease -an unusual or allergic reaction to amlodipine, other medicines, foods, dyes, or preservatives -pregnant or trying to get pregnant -breast-feeding How should I use this medicine? Take this medicine by mouth with a glass of water. Follow the directions on the prescription label. Take your medicine at regular intervals. Do not take more medicine than directed. Talk to your pediatrician regarding the use of this medicine in children. Special care may be needed. This medicine has been used in children as young as 6. Persons over 29 years old may have a stronger reaction to this medicine and need smaller doses. Overdosage: If you think you have taken too much of this medicine contact a poison control center or emergency room at once. NOTE: This medicine is only for you. Do not share this medicine with others. What if I miss a dose? If you miss a dose, take it as soon as you can. If it is almost time for your next dose, take only that dose. Do not take double or extra doses. What may interact with this medicine? -herbal or dietary supplements -local or general anesthetics -medicines for high blood pressure -medicines for prostate problems -rifampin This list may not describe all possible interactions. Give your health care provider a list of all the medicines, herbs, non-prescription drugs, or dietary supplements you use. Also tell them if you smoke, drink alcohol, or use illegal drugs. Some items may interact with your medicine. What should I watch for while using this medicine? Visit your doctor or health care professional  for regular check ups. Check your blood pressure and pulse rate regularly. Ask your health care professional what your blood pressure and pulse rate should be, and when you should contact him or her. This medicine may make you feel confused, dizzy or lightheaded. Do not  drive, use machinery, or do anything that needs mental alertness until you know how this medicine affects you. To reduce the risk of dizzy or fainting spells, do not sit or stand up quickly, especially if you are an older patient. Avoid alcoholic drinks; they can make you more dizzy. Do not suddenly stop taking amlodipine. Ask your doctor or health care professional how you can gradually reduce the dose. What side effects may I notice from receiving this medicine? Side effects that you should report to your doctor or health care professional as soon as possible: -allergic reactions like skin rash, itching or hives, swelling of the face, lips, or tongue -breathing problems -changes in vision or hearing -chest pain -fast, irregular heartbeat -swelling of legs or ankles Side effects that usually do not require medical attention (report to your doctor or health care professional if they continue or are bothersome): -dry mouth -facial flushing -nausea, vomiting -stomach gas, pain -tired, weak -trouble sleeping This list may not describe all possible side effects. Call your doctor for medical advice about side effects. You may report side effects to FDA at 1-800-FDA-1088. Where should I keep my medicine? Keep out of the reach of children. Store at room temperature between 59 and 86 degrees F (15 and 30 degrees C). Protect from light. Keep container tightly closed. Throw away any unused medicine after the expiration date. NOTE: This sheet is a summary. It may not cover all possible information. If you have questions about this medicine, talk to your doctor, pharmacist, or health care provider.  2018 Elsevier/Gold Standard (2011-12-23 11:40:58)

## 2016-09-07 ENCOUNTER — Telehealth (HOSPITAL_COMMUNITY): Payer: Self-pay | Admitting: *Deleted

## 2016-09-07 NOTE — Telephone Encounter (Signed)
Left message on voicemail per DPR in reference to upcoming appointment scheduled on 09/12/16 with detailed instructions given per Myocardial Perfusion Study Information Sheet for the test. LM to arrive 15 minutes early, and that it is imperative to arrive on time for appointment to keep from having the test rescheduled. If you need to cancel or reschedule your appointment, please call the office within 24 hours of your appointment. Failure to do so may result in a cancellation of your appointment, and a $50 no show fee. Phone number given for call back for any questions. Kalis Friese Jacqueline    

## 2016-09-12 ENCOUNTER — Ambulatory Visit (INDEPENDENT_AMBULATORY_CARE_PROVIDER_SITE_OTHER): Payer: No Typology Code available for payment source

## 2016-09-12 ENCOUNTER — Ambulatory Visit (HOSPITAL_COMMUNITY): Payer: No Typology Code available for payment source | Attending: Internal Medicine

## 2016-09-12 DIAGNOSIS — R079 Chest pain, unspecified: Secondary | ICD-10-CM

## 2016-09-12 DIAGNOSIS — I2510 Atherosclerotic heart disease of native coronary artery without angina pectoris: Secondary | ICD-10-CM | POA: Diagnosis not present

## 2016-09-12 DIAGNOSIS — R000 Tachycardia, unspecified: Secondary | ICD-10-CM | POA: Diagnosis not present

## 2016-09-12 DIAGNOSIS — R9439 Abnormal result of other cardiovascular function study: Secondary | ICD-10-CM | POA: Insufficient documentation

## 2016-09-12 DIAGNOSIS — R002 Palpitations: Secondary | ICD-10-CM | POA: Insufficient documentation

## 2016-09-12 DIAGNOSIS — I119 Hypertensive heart disease without heart failure: Secondary | ICD-10-CM | POA: Diagnosis not present

## 2016-09-12 LAB — MYOCARDIAL PERFUSION IMAGING
CHL CUP NUCLEAR SDS: 0
CHL CUP NUCLEAR SRS: 4
CHL CUP NUCLEAR SSS: 4
LHR: 0.27
LV dias vol: 143 mL (ref 62–150)
LVSYSVOL: 80 mL
Peak HR: 107 {beats}/min
Rest HR: 80 {beats}/min
TID: 0.97

## 2016-09-12 MED ORDER — TECHNETIUM TC 99M TETROFOSMIN IV KIT
33.0000 | PACK | Freq: Once | INTRAVENOUS | Status: AC | PRN
  Administered 2016-09-12: 33 via INTRAVENOUS
  Filled 2016-09-12: qty 33

## 2016-09-12 MED ORDER — REGADENOSON 0.4 MG/5ML IV SOLN
0.4000 mg | Freq: Once | INTRAVENOUS | Status: AC
  Administered 2016-09-12: 0.4 mg via INTRAVENOUS

## 2016-09-12 MED ORDER — TECHNETIUM TC 99M TETROFOSMIN IV KIT
10.7000 | PACK | Freq: Once | INTRAVENOUS | Status: AC | PRN
Start: 2016-09-12 — End: 2016-09-12
  Administered 2016-09-12: 10.7 via INTRAVENOUS
  Filled 2016-09-12: qty 11

## 2016-09-15 ENCOUNTER — Other Ambulatory Visit: Payer: Self-pay | Admitting: *Deleted

## 2016-09-15 DIAGNOSIS — R931 Abnormal findings on diagnostic imaging of heart and coronary circulation: Secondary | ICD-10-CM

## 2016-09-19 ENCOUNTER — Other Ambulatory Visit: Payer: Self-pay

## 2016-09-19 ENCOUNTER — Ambulatory Visit (HOSPITAL_COMMUNITY): Payer: No Typology Code available for payment source | Attending: Cardiovascular Disease

## 2016-09-19 ENCOUNTER — Encounter (INDEPENDENT_AMBULATORY_CARE_PROVIDER_SITE_OTHER): Payer: Self-pay

## 2016-09-19 ENCOUNTER — Encounter (HOSPITAL_COMMUNITY): Payer: Self-pay | Admitting: *Deleted

## 2016-09-19 DIAGNOSIS — I5030 Unspecified diastolic (congestive) heart failure: Secondary | ICD-10-CM | POA: Diagnosis not present

## 2016-09-19 DIAGNOSIS — R931 Abnormal findings on diagnostic imaging of heart and coronary circulation: Secondary | ICD-10-CM

## 2016-09-19 MED ORDER — PERFLUTREN LIPID MICROSPHERE
1.0000 mL | INTRAVENOUS | Status: AC | PRN
  Administered 2016-09-19: 2 mL via INTRAVENOUS
  Administered 2016-09-19: 1 mL via INTRAVENOUS

## 2016-09-19 NOTE — Progress Notes (Unsigned)
Definity was administered during the Echocardiogram to enhance images.  Upon first injection, the patient complained of the IV site (Right Anticubital) hurting and burning.  Administration was stopped immediately and the IV was removed.  There is no evidence of infiltration at site, it is clean, dry and without bruising.  Pressure was held and a bandage was placed.  A second IV was placed in the Right Hand (22 G, by Aida Raider) which functioned well for administering the definity for imaging.    Farrel Conners, RDCS

## 2016-09-23 ENCOUNTER — Telehealth: Payer: Self-pay | Admitting: Cardiology

## 2016-09-23 NOTE — Telephone Encounter (Signed)
New message ° ° ° °Pt is calling about results. Please call.  °

## 2016-09-23 NOTE — Telephone Encounter (Signed)
Left detailed message (DPR on file) informing pt that echo result had not been read by [name] yet, as he is out on vacation this week. Explained that I would call him next week with result once read by physician. Advised to call back with further questions/concerns.

## 2016-09-30 ENCOUNTER — Telehealth: Payer: Self-pay | Admitting: Cardiology

## 2016-09-30 NOTE — Telephone Encounter (Signed)
New Message ° °Pt call requesting to speak with RN about test results. Please call back to discuss  °

## 2016-09-30 NOTE — Telephone Encounter (Signed)
Informed of results.  

## 2016-10-04 ENCOUNTER — Encounter: Payer: No Typology Code available for payment source | Admitting: Medical

## 2016-10-04 ENCOUNTER — Ambulatory Visit: Payer: No Typology Code available for payment source | Admitting: Cardiology

## 2016-10-17 ENCOUNTER — Encounter: Payer: Self-pay | Admitting: Cardiology

## 2016-10-17 ENCOUNTER — Ambulatory Visit (INDEPENDENT_AMBULATORY_CARE_PROVIDER_SITE_OTHER): Payer: No Typology Code available for payment source | Admitting: Cardiology

## 2016-10-17 VITALS — BP 126/98 | HR 78 | Ht 65.0 in | Wt 213.4 lb

## 2016-10-17 DIAGNOSIS — I10 Essential (primary) hypertension: Secondary | ICD-10-CM

## 2016-10-17 DIAGNOSIS — I5020 Unspecified systolic (congestive) heart failure: Secondary | ICD-10-CM

## 2016-10-17 MED ORDER — METOPROLOL SUCCINATE ER 50 MG PO TB24: 50.0000 mg | ORAL_TABLET | Freq: Every day | ORAL | 3 refills | Status: DC

## 2016-10-17 MED ORDER — LISINOPRIL 20 MG PO TABS: 20.0000 mg | ORAL_TABLET | Freq: Every day | ORAL | 3 refills | Status: DC

## 2016-10-17 NOTE — Addendum Note (Signed)
Addended by: Lendon Ka on: 10/17/2016 02:12 PM   Modules accepted: Orders

## 2016-10-17 NOTE — Patient Instructions (Addendum)
Your physician has recommended you make the following change in your medication:  1.) stop hctz 2.) stop amlodipine 3.) start Toprol XL (metoprolol succinate) 25 mg once a day 4.) start lisinopril 20 mg once a day  Your physician recommends that you return for lab work in: 2 weeks (BMET)   Your physician has requested that you have an echocardiogram. Echocardiography is a painless test that uses sound waves to create images of your heart. It provides your doctor with information about the size and shape of your heart and how well your heart's chambers and valves are working. This procedure takes approximately one hour. There are no restrictions for this procedure.  PLEASE SCHEDULE THIS JUST PRIOR TO YOUR NEXT APPOINTMENT (IN 6 MONTHS) WITH [name].  Your physician wants you to follow-up in: 6 MONTHS WITH [name]. You will receive a reminder letter in the mail two months in advance. If you don't receive a letter, please call our office to schedule the follow-up appointment.

## 2016-10-17 NOTE — Progress Notes (Signed)
Electrophysiology Office Note   Date:  10/17/2016   ID:  Bobby Kelly, DOB 27-Aug-1980, MRN [id number]  PCP:  Bobby Canavan, PA-C  Cardiologist:  none Primary Electrophysiologist:  Bobby Kelly Bobby Loa, MD    Chief Complaint  Patient presents with  . Follow-up    Palpitations/Echo results     History of Present Illness: Bobby Kelly is a 36 y.o. male who is being seen today for the evaluation of palpitations at the request of [name]. Presenting today for electrophysiology evaluation. Had rapid heart rate when she awoke eariler this month. HR on fit bit 100-108. Complained of chset pain that lasted the whole day. HR was in the 130s that day with minimal activity. His heart rate is remained elevated since that time. He is recently put on hydrochlorothiazide for blood pressure at his primary physician's office. He also endorses chest pain. The pain is in the center of his chest. The pain occurs at times when he exerts himself and is at times improved with rest. The pain also occurs when he is simply resting, such as waking up from sleep. He did say that he was treated for hyperthyroidism in the past. This has not been checked since he was in high school.  Today, he denies symptoms of palpitations, chest pain, shortness of breath, PND, lower extremity edema, claudication, dizziness, presyncope, syncope, bleeding, or neurologic sequela. The patient is tolerating medications without difficulties. He has been feeling better since he was last seen. He has had no further episodes of chest pain, or palpitations. He does have minor episodes of shortness of breath that mainly occur when he is lying down. He had an echocardiogram that showed an EF of 35-40%.   Past Medical History:  Diagnosis Date  . Allergy   . Dry skin    nasal folds  . Thyroid disease    hyperactive in high school, had oral therapy.    . Wears contact lenses    Past Surgical History:  Procedure Laterality  Date  . APPENDECTOMY       Current Outpatient Prescriptions  Medication Sig Dispense Refill  . lisinopril (PRINIVIL,ZESTRIL) 20 MG tablet Take 1 tablet (20 mg total) by mouth daily. 90 tablet 3  . metoprolol succinate (TOPROL-XL) 50 MG 24 hr tablet Take 1 tablet (50 mg total) by mouth daily. Take with or immediately following a meal. 90 tablet 3   No current facility-administered medications for this visit.     Allergies:   Patient has no known allergies.   Social History:  The patient  reports that he has never smoked. He has never used smokeless tobacco. He reports that he drinks about 3.6 oz of alcohol per week . He reports that he does not use drugs.   Family History:  The patient's family history includes Hyperlipidemia in his father; Hypertension in his mother; Pancreatic cancer in his maternal grandmother.    ROS:  Please see the history of present illness.   Otherwise, review of systems is positive for Shortness of breath at night.   All other systems are reviewed and negative.   PHYSICAL EXAM: VS:  BP (!) 126/98   Pulse 78   Ht 5\' 5"  (1.651 m)   Wt 213 lb 6.4 oz (96.8 kg)   BMI 35.51 kg/m  , BMI Body mass index is 35.51 kg/m. GEN: Well nourished, well developed, in no acute distress  HEENT: normal  Neck: no JVD, carotid bruits, or masses Cardiac: RRR;  no murmurs, rubs, or gallops,no edema  Respiratory:  clear to auscultation bilaterally, normal work of breathing GI: soft, nontender, nondistended, + BS MS: no deformity or atrophy  Skin: warm and dry Neuro:  Strength and sensation are intact Psych: euthymic mood, full affect  EKG:  EKG is not ordered today. Personal review of the ekg ordered 08/17/06 shows sinus rhythm, rate 95, prolonged QTc   Recent Labs: 04/28/2016: ALT 38; BUN 14; Creat 0.83; Hemoglobin 17.5; Platelets 277; Potassium 3.7; Sodium 138 08/29/2016: TSH 1.130    Lipid Panel  No results found for: CHOL, TRIG, HDL, CHOLHDL, VLDL, LDLCALC,  LDLDIRECT   Wt Readings from Last 3 Encounters:  10/17/16 213 lb 6.4 oz (96.8 kg)  08/29/16 221 lb 6.4 oz (100.4 kg)  08/16/16 229 lb (103.9 kg)      Other studies Reviewed: Additional studies/ records that were reviewed today include:  Myoview 09/12/16  Nuclear stress EF: 44%.  No significant change in nonspecific ST abnormality during infusion.  There is a small defect of mild severity present in the basal inferior and mid inferior location. The defect is partially reversible and consistent with infarct with peri infarct ischemia vs. Variations in diaphragmatic attenuation artifact.  The left ventricular ejection fraction is moderately decreased (30-44%).  This is an intermediate risk study.  Recommend 2D echo for further assessment of LVF and inferior wall motion.  Holter 09/15/16 - personally reviewed Minimum HR: 55 BPM at 12:43:14 AM(2) Maximum HR: 136 BPM at 8:23:05 PM Average HR: 91 BPM Sinus rhythm with sinus tachycardia No arrhythmia seen. <1% APCs  TTE 09/19/16 - Left ventricle: The cavity size was normal. Wall thickness was   normal. Systolic function was moderately reduced. The estimated   ejection fraction was in the range of 35% to 40%. Features are   consistent with a pseudonormal left ventricular filling pattern,   with concomitant abnormal relaxation and increased filling   pressure (grade 2 diastolic dysfunction). - Right atrium: The atrium was mildly dilated.   ASSESSMENT AND PLAN:  1.  Palpitations: No longer having palpitations. Starting on Toprol-XL 50 mg for systolic heart failure.  2. Systolic heart failure, unknown chronicity: Currently feeling well without signs of volume overload. We'll plan to stop hydrochlorothiazide and amlodipine and start lisinopril and Toprol-XL. It is unclear as to the cause of his cardiomyopathy, though unlikely due to ischemia as per recent stress test. We'll see him back in 6 months with an echo at that time to  reevaluate.  3. Hypertension: Blood pressure better controlled on current medications. Due to systolic heart failure, we'll stop amlodipine and HCTZ and start lisinopril and Toprol-XL.  Current medicines are reviewed at length with the patient today.   The patient does not have concerns regarding his medicines.  The following changes were made today:  Stop amlodipine, HCTZ, start Toprol-XL, lisinopril  Labs/ tests ordered today include:  Orders Placed This Encounter  Procedures  . ECHOCARDIOGRAM COMPLETE     Disposition:   FU with Marie Chow 6 months  Signed, Adele Milson Bobby Loa, MD  10/17/2016 11:32 AM     [facility] [address] [place] [address] [phone] (office) [phone] (fax)

## 2016-11-01 ENCOUNTER — Other Ambulatory Visit: Payer: No Typology Code available for payment source

## 2016-11-01 DIAGNOSIS — I10 Essential (primary) hypertension: Secondary | ICD-10-CM

## 2016-11-01 DIAGNOSIS — I5020 Unspecified systolic (congestive) heart failure: Secondary | ICD-10-CM

## 2016-11-01 LAB — BASIC METABOLIC PANEL
BUN / CREAT RATIO: 17 (ref 9–20)
BUN: 17 mg/dL (ref 6–20)
CO2: 23 mmol/L (ref 20–29)
CREATININE: 0.98 mg/dL (ref 0.76–1.27)
Calcium: 9.3 mg/dL (ref 8.7–10.2)
Chloride: 100 mmol/L (ref 96–106)
GFR calc non Af Amer: 100 mL/min/{1.73_m2} (ref >59)
GFR, EST AFRICAN AMERICAN: 116 mL/min/{1.73_m2} (ref >59)
Glucose: 91 mg/dL (ref 65–99)
Potassium: 4.7 mmol/L (ref 3.5–5.2)
Sodium: 137 mmol/L (ref 134–144)

## 2016-11-03 ENCOUNTER — Ambulatory Visit (INDEPENDENT_AMBULATORY_CARE_PROVIDER_SITE_OTHER): Payer: No Typology Code available for payment source | Admitting: Medical

## 2016-11-03 ENCOUNTER — Encounter: Payer: Self-pay | Admitting: Medical

## 2016-11-03 VITALS — BP 128/78 | HR 71 | Ht 64.5 in | Wt 213.2 lb

## 2016-11-03 DIAGNOSIS — I519 Heart disease, unspecified: Secondary | ICD-10-CM | POA: Insufficient documentation

## 2016-11-03 DIAGNOSIS — Z7189 Other specified counseling: Secondary | ICD-10-CM | POA: Diagnosis not present

## 2016-11-03 DIAGNOSIS — Z113 Encounter for screening for infections with a predominantly sexual mode of transmission: Secondary | ICD-10-CM

## 2016-11-03 DIAGNOSIS — Z23 Encounter for immunization: Secondary | ICD-10-CM | POA: Insufficient documentation

## 2016-11-03 DIAGNOSIS — E669 Obesity, unspecified: Secondary | ICD-10-CM | POA: Insufficient documentation

## 2016-11-03 DIAGNOSIS — IMO0001 Reserved for inherently not codable concepts without codable children: Secondary | ICD-10-CM

## 2016-11-03 DIAGNOSIS — R002 Palpitations: Secondary | ICD-10-CM | POA: Diagnosis not present

## 2016-11-03 DIAGNOSIS — Z6836 Body mass index (BMI) 36.0-36.9, adult: Secondary | ICD-10-CM | POA: Diagnosis not present

## 2016-11-03 DIAGNOSIS — Z7185 Encounter for immunization safety counseling: Secondary | ICD-10-CM | POA: Insufficient documentation

## 2016-11-03 DIAGNOSIS — Z0000 Encounter for general adult medical examination without abnormal findings: Secondary | ICD-10-CM | POA: Diagnosis not present

## 2016-11-03 LAB — POCT URINALYSIS DIP (PROADVANTAGE DEVICE)
BILIRUBIN UA: NEGATIVE mg/dL
Bilirubin, UA: NEGATIVE
Blood, UA: NEGATIVE
Glucose, UA: NEGATIVE mg/dL
LEUKOCYTES UA: NEGATIVE
NITRITE UA: NEGATIVE
PH UA: 6 (ref 5.0–8.0)
PROTEIN UA: NEGATIVE mg/dL
Specific Gravity, Urine: 1.02
UUROB: NEGATIVE

## 2016-11-03 NOTE — Progress Notes (Signed)
Subjective:   HPI  Bobby Kelly is a 36 y.o. male who presents for physical Chief Complaint  Patient presents with  . Annual Exam    physical   Medical care team includes: Tysinger, Kermit Balo, PA-C here for primary care Dentist Eye doctor Cardiology, Dr. Loman Brooklyn  Concerns:  Recent f/u with cardiology. Diagnosed with systolic heart failure recently.  Has f/u and repeat echo in 6 months.  Compliant with heart medications  He is jogging 3 days per week, and planning to join gym class and do other things to lose weight.  Reviewed their medical, surgical, family, social, medication, and allergy history and updated chart as appropriate.  Past Medical History:  Diagnosis Date  . Allergy   . Dry skin    nasal folds  . Thyroid disease    hyperactive in high school, had oral therapy.    . Wears contact lenses     Past Surgical History:  Procedure Laterality Date  . APPENDECTOMY      Social History   Social History  . Marital status: Married    Spouse name: N/A  . Number of children: N/A  . Years of education: N/A   Occupational History  . Not on file.   Social History Main Topics  . Smoking status: Never Smoker  . Smokeless tobacco: Never Used  . Alcohol use 2.4 oz/week    3 Cans of beer, 1 Shots of liquor per week     Comment: occ, history of 2-3 years of heavier alcohol  . Drug use: No  . Sexual activity: Not on file   Other Topics Concern  . Not on file   Social History Narrative   Lives at home with wife and 1yo son. Son's birthday end of September.   Exercise - some jogging 2-3 times per week.    Considering joining gym.   10/2016    Family History  Problem Relation Age of Onset  . Hypertension Mother   . Cataracts Mother   . Hyperlipidemia Father   . Pancreatic cancer Maternal Grandmother   . Cancer Maternal Grandmother   . Diabetes Other   . Heart disease Neg Hx   . Stroke Neg Hx      Current Outpatient Prescriptions:  .  lisinopril  (PRINIVIL,ZESTRIL) 20 MG tablet, Take 1 tablet (20 mg total) by mouth daily., Disp: 90 tablet, Rfl: 3 .  metoprolol succinate (TOPROL-XL) 50 MG 24 hr tablet, Take 1 tablet (50 mg total) by mouth daily. Take with or immediately following a meal., Disp: 90 tablet, Rfl: 3  No Known Allergies   Review of Systems Constitutional: -fever, -chills, -sweats, -unexpected weight change, -decreased appetite, -fatigue Allergy: -sneezing, -itching, -congestion Dermatology: -changing moles, --rash, -lumps ENT: -runny nose, -ear pain, -sore throat, -hoarseness, -sinus pain, -teeth pain, - ringing in ears, -hearing loss, -nosebleeds Cardiology: -chest pain, -palpitations, -swelling, -difficulty breathing when lying flat, -waking up short of breath Respiratory: -cough, -shortness of breath, -difficulty breathing with exercise or exertion, -wheezing, -coughing up blood Gastroenterology: -abdominal pain, -nausea, -vomiting, -diarrhea, -constipation, -blood in stool, -changes in bowel movement, -difficulty swallowing or eating Hematology: -bleeding, -bruising  Musculoskeletal: -joint aches, -muscle aches, -joint swelling, -back pain, -neck pain, -cramping, -changes in gait Ophthalmology: denies vision changes, eye redness, itching, discharge Urology: -burning with urination, -difficulty urinating, -blood in urine, -urinary frequency, -urgency, -incontinence Neurology: -headache, -weakness, -tingling, -numbness, -memory loss, -falls, -dizziness Psychology: -depressed mood, -agitation, -sleep problems     Objective:   BP  128/78   Pulse 71   Ht 5' 4.5" (1.638 m)   Wt 213 lb 3.2 oz (96.7 kg)   SpO2 98%   BMI 36.03 kg/m   General appearance: alert, no distress, WD/WN, Hispanic male Skin: no worrisome lesions HEENT: normocephalic, conjunctiva/corneas normal, sclerae anicteric, PERRLA, EOMi, nares patent, no discharge or erythema, pharynx normal Oral cavity: MMM, tongue normal, teeth in good repair Neck:  supple, no lymphadenopathy, no thyromegaly, no masses, normal ROM, no bruits Chest: non tender, normal shape and expansion Heart: RRR, normal S1, S2, no murmurs Lungs: CTA bilaterally, no wheezes, rhonchi, or rales Abdomen: +bs, soft, non tender, non distended, no masses, no hepatomegaly, no splenomegaly, no bruits Back: non tender, normal ROM, no scoliosis Musculoskeletal: upper extremities non tender, no obvious deformity, normal ROM throughout, lower extremities non tender, no obvious deformity, normal ROM throughout Extremities: no edema, no cyanosis, no clubbing Pulses: 2+ symmetric, upper and lower extremities, normal cap refill Neurological: alert, oriented x 3, CN2-12 intact, strength normal upper extremities and lower extremities, sensation normal throughout, DTRs 2+ throughout, no cerebellar signs, gait normal Psychiatric: normal affect, behavior normal, pleasant  GU: normal male external genitalia,uncircumcised, nontender, no masses, no hernia, no lymphadenopathy Rectal: deferred   Assessment and Plan :    Encounter Diagnoses  Name Primary?  . Routine general medical examination at a health care facility Yes  . Need for influenza vaccination   . Palpitation   . Heart disease   . Vaccine counseling   . Screen for STD (sexually transmitted disease)   . Class 2 obesity with serious comorbidity and body mass index (BMI) of 36.0 to 36.9 in adult, unspecified obesity type     Physical exam - discussed and counseled on healthy lifestyle, diet, exercise, preventative care, vaccinations, sick and well care, proper use of emergency dept and after hours care, and addressed their concerns.    Health screening: See your eye doctor yearly for routine vision care. See your dentist yearly for routine dental care including hygiene visits twice yearly.  Cancer screening Discussed self testicular exam  Vaccinations: Counseled on the following vaccines:  Counseled on the influenza virus  vaccine.  Vaccine information sheet given.  Influenza vaccine given after consent obtained. Advsied he do pneumococcal vaccine.  He will consider.  Will get copy of Tdap from college from 4 years ago  Acute issues discussed: none  Separate significant chronic issues discussed: F/u with cardiology as planned, c/t same medications, work on diet and exercise as we discussed including gradual increase in intensity of exercise over time.  Avoid heavy weight lifting. counseled on obesity and lifestyle  Bobby Kelly was seen today for annual exam.  Diagnoses and all orders for this visit:  Routine general medical examination at a health care facility -     POCT Urinalysis DIP (Proadvantage Device) -     HIV antibody -     RPR -     C. trachomatis/N. gonorrhoeae RNA -     Hemoglobin A1c -     Lipid panel -     CBC with Differential/Platelet  Need for influenza vaccination -     Flu Vaccine QUAD 36+ mos IM  Palpitation  Heart disease  Vaccine counseling  Screen for STD (sexually transmitted disease) -     HIV antibody -     RPR -     C. trachomatis/N. gonorrhoeae RNA  Class 2 obesity with serious comorbidity and body mass index (BMI) of 36.0 to 36.9  in adult, unspecified obesity type  Follow-up pending labs, yearly for physical

## 2016-11-03 NOTE — Patient Instructions (Signed)
Recommendations:  Continue to increase exercise tolerance gradually  Eat a health low fat diet  Consider pneumococcal vaccine  Get a yearly flu shot  See your eye doctor yearly for routine vision care.  See your dentist yearly for routine dental care including hygiene visits twice yearly.   Health Maintenance, Male A healthy lifestyle and preventive care is important for your health and wellness. Ask your health care provider about what schedule of regular examinations is right for you. What should I know about weight and diet? Eat a Healthy Diet  Eat plenty of vegetables, fruits, whole grains, low-fat dairy products, and lean protein.  Do not eat a lot of foods high in solid fats, added sugars, or salt.  Maintain a Healthy Weight Regular exercise can help you achieve or maintain a healthy weight. You should:  Do at least 150 minutes of exercise each week. The exercise should increase your heart rate and make you sweat (moderate-intensity exercise).  Do strength-training exercises at least twice a week.  Watch Your Levels of Cholesterol and Blood Lipids  Have your blood tested for lipids and cholesterol every 5 years starting at 35 years of age. If you are at high risk for heart disease, you should start having your blood tested when you are 35 years old. You may need to have your cholesterol levels checked more often if: ? Your lipid or cholesterol levels are high. ? You are older than 35 years of age. ? You are at high risk for heart disease.  What should I know about cancer screening? Many types of cancers can be detected early and may often be prevented. Lung Cancer  You should be screened every year for lung cancer if: ? You are a current smoker who has smoked for at least 30 years. ? You are a former smoker who has quit within the past 15 years.  Talk to your health care provider about your screening options, when you should start screening, and how often you  should be screened.  Colorectal Cancer  Routine colorectal cancer screening usually begins at 35 years of age and should be repeated every 5-10 years until you are 35 years old. You may need to be screened more often if early forms of precancerous polyps or small growths are found. Your health care provider may recommend screening at an earlier age if you have risk factors for colon cancer.  Your health care provider may recommend using home test kits to check for hidden blood in the stool.  A small camera at the end of a tube can be used to examine your colon (sigmoidoscopy or colonoscopy). This checks for the earliest forms of colorectal cancer.  Prostate and Testicular Cancer  Depending on your age and overall health, your health care provider may do certain tests to screen for prostate and testicular cancer.  Talk to your health care provider about any symptoms or concerns you have about testicular or prostate cancer.  Skin Cancer  Check your skin from head to toe regularly.  Tell your health care provider about any new moles or changes in moles, especially if: ? There is a change in a mole's size, shape, or color. ? You have a mole that is larger than a pencil eraser.  Always use sunscreen. Apply sunscreen liberally and repeat throughout the day.  Protect yourself by wearing long sleeves, pants, a wide-brimmed hat, and sunglasses when outside.  What should I know about heart disease, diabetes, and high blood pressure?  If you are 48-17 years of age, have your blood pressure checked every 3-5 years. If you are 2 years of age or older, have your blood pressure checked every year. You should have your blood pressure measured twice-once when you are at a hospital or clinic, and once when you are not at a hospital or clinic. Record the average of the two measurements. To check your blood pressure when you are not at a hospital or clinic, you can use: ? An automated blood pressure  machine at a pharmacy. ? A home blood pressure monitor.  Talk to your health care provider about your target blood pressure.  If you are between 17-60 years old, ask your health care provider if you should take aspirin to prevent heart disease.  Have regular diabetes screenings by checking your fasting blood sugar level. ? If you are at a normal weight and have a low risk for diabetes, have this test once every three years after the age of 67. ? If you are overweight and have a high risk for diabetes, consider being tested at a younger age or more often.  A one-time screening for abdominal aortic aneurysm (AAA) by ultrasound is recommended for men aged 65-75 years who are current or former smokers. What should I know about preventing infection? Hepatitis B If you have a higher risk for hepatitis B, you should be screened for this virus. Talk with your health care provider to find out if you are at risk for hepatitis B infection. Hepatitis C Blood testing is recommended for:  Everyone born from [birth year] through 1964.  Anyone with known risk factors for hepatitis C.  Sexually Transmitted Diseases (STDs)  You should be screened each year for STDs including gonorrhea and chlamydia if: ? You are sexually active and are younger than 35 years of age. ? You are older than 35 years of age and your health care provider tells you that you are at risk for this type of infection. ? Your sexual activity has changed since you were last screened and you are at an increased risk for chlamydia or gonorrhea. Ask your health care provider if you are at risk.  Talk with your health care provider about whether you are at high risk of being infected with HIV. Your health care provider may recommend a prescription medicine to help prevent HIV infection.  What else can I do?  Schedule regular health, dental, and eye exams.  Stay current with your vaccines (immunizations).  Do not use any tobacco products,  such as cigarettes, chewing tobacco, and e-cigarettes. If you need help quitting, ask your health care provider.  Limit alcohol intake to no more than 2 drinks per day. One drink equals 12 ounces of beer, 5 ounces of wine, or 1 ounces of hard liquor.  Do not use street drugs.  Do not share needles.  Ask your health care provider for help if you need support or information about quitting drugs.  Tell your health care provider if you often feel depressed.  Tell your health care provider if you have ever been abused or do not feel safe at home. This information is not intended to replace advice given to you by your health care provider. Make sure you discuss any questions you have with your health care provider. Document Released: 07/23/2007 Document Revised: 09/23/2015 Document Reviewed: 10/28/2014 Elsevier Interactive Patient Education  [employer].

## 2016-11-04 LAB — CBC WITH DIFFERENTIAL/PLATELET
BASOS ABS: 55 {cells}/uL (ref 0–200)
BASOS PCT: 0.5 %
Eosinophils Absolute: 220 cells/uL (ref 15–500)
Eosinophils Relative: 2 %
HEMATOCRIT: 47.9 % (ref 38.5–50.0)
Hemoglobin: 16.7 g/dL (ref 13.2–17.1)
Lymphs Abs: 2750 cells/uL (ref 850–3900)
MCH: 30.6 pg (ref 27.0–33.0)
MCHC: 34.9 g/dL (ref 32.0–36.0)
MCV: 87.7 fL (ref 80.0–100.0)
MONOS PCT: 7 %
MPV: 10.8 fL (ref 7.5–12.5)
NEUTROS ABS: 7205 {cells}/uL (ref 1500–7800)
Neutrophils Relative %: 65.5 %
Platelets: 301 10*3/uL (ref 140–400)
RBC: 5.46 10*6/uL (ref 4.20–5.80)
RDW: 12.8 % (ref 11.0–15.0)
Total Lymphocyte: 25 %
WBC mixed population: 770 cells/uL (ref 200–950)
WBC: 11 10*3/uL — ABNORMAL HIGH (ref 3.8–10.8)

## 2016-11-04 LAB — RPR: RPR Ser Ql: NONREACTIVE

## 2016-11-04 LAB — LIPID PANEL
CHOL/HDL RATIO: 3.3 (calc) (ref <5.0)
CHOLESTEROL: 168 mg/dL (ref <200)
HDL: 51 mg/dL (ref >40)
LDL CHOLESTEROL (CALC): 94 mg/dL
Non-HDL Cholesterol (Calc): 117 mg/dL (calc) (ref <130)
TRIGLYCERIDES: 126 mg/dL (ref <150)

## 2016-11-04 LAB — C. TRACHOMATIS/N. GONORRHOEAE RNA
C. TRACHOMATIS RNA, TMA: NOT DETECTED
N. gonorrhoeae RNA, TMA: NOT DETECTED

## 2016-11-04 LAB — HEMOGLOBIN A1C
EAG (MMOL/L): 5.8 (calc)
Hgb A1c MFr Bld: 5.3 % of total Hgb (ref <5.7)
Mean Plasma Glucose: 105 (calc)

## 2016-11-04 LAB — HIV ANTIBODY (ROUTINE TESTING W REFLEX): HIV: NONREACTIVE

## 2016-11-07 ENCOUNTER — Other Ambulatory Visit: Payer: Self-pay | Admitting: Medical

## 2016-11-07 MED ORDER — PRAVASTATIN SODIUM 20 MG PO TABS: 20.0000 mg | ORAL_TABLET | Freq: Every evening | ORAL | 0 refills | Status: DC

## 2016-11-25 ENCOUNTER — Encounter (HOSPITAL_COMMUNITY): Payer: Self-pay | Admitting: Emergency Medicine

## 2016-11-25 DIAGNOSIS — Z79899 Other long term (current) drug therapy: Secondary | ICD-10-CM | POA: Insufficient documentation

## 2016-11-25 DIAGNOSIS — K8020 Calculus of gallbladder without cholecystitis without obstruction: Secondary | ICD-10-CM | POA: Insufficient documentation

## 2016-11-25 DIAGNOSIS — E079 Disorder of thyroid, unspecified: Secondary | ICD-10-CM | POA: Insufficient documentation

## 2016-11-25 DIAGNOSIS — I10 Essential (primary) hypertension: Secondary | ICD-10-CM | POA: Diagnosis not present

## 2016-11-25 DIAGNOSIS — R1011 Right upper quadrant pain: Secondary | ICD-10-CM | POA: Diagnosis present

## 2016-11-25 LAB — COMPREHENSIVE METABOLIC PANEL
ALBUMIN: 4.4 g/dL (ref 3.5–5.0)
ALT: 215 U/L — ABNORMAL HIGH (ref 17–63)
ANION GAP: 13 (ref 5–15)
AST: 330 U/L — ABNORMAL HIGH (ref 15–41)
Alkaline Phosphatase: 103 U/L (ref 38–126)
BUN: 14 mg/dL (ref 6–20)
CO2: 22 mmol/L (ref 22–32)
CREATININE: 1.07 mg/dL (ref 0.61–1.24)
Calcium: 9.2 mg/dL (ref 8.9–10.3)
Chloride: 98 mmol/L — ABNORMAL LOW (ref 101–111)
GFR calc Af Amer: >60 mL/min (ref >60)
GFR calc non Af Amer: >60 mL/min (ref >60)
Glucose, Bld: 165 mg/dL — ABNORMAL HIGH (ref 65–99)
Potassium: 3.9 mmol/L (ref 3.5–5.1)
SODIUM: 133 mmol/L — AB (ref 135–145)
Total Bilirubin: 0.9 mg/dL (ref 0.3–1.2)
Total Protein: 8.3 g/dL — ABNORMAL HIGH (ref 6.5–8.1)

## 2016-11-25 LAB — URINALYSIS, ROUTINE W REFLEX MICROSCOPIC
BILIRUBIN URINE: NEGATIVE
GLUCOSE, UA: NEGATIVE mg/dL
Hgb urine dipstick: NEGATIVE
Ketones, ur: NEGATIVE mg/dL
Leukocytes, UA: NEGATIVE
NITRITE: NEGATIVE
PH: 5 (ref 5.0–8.0)
Protein, ur: 30 mg/dL — AB
SPECIFIC GRAVITY, URINE: 1.026 (ref 1.005–1.030)
Squamous Epithelial / LPF: NONE SEEN

## 2016-11-25 LAB — CBC
HCT: 49.6 % (ref 39.0–52.0)
Hemoglobin: 17.3 g/dL — ABNORMAL HIGH (ref 13.0–17.0)
MCH: 31.5 pg (ref 26.0–34.0)
MCHC: 34.9 g/dL (ref 30.0–36.0)
MCV: 90.2 fL (ref 78.0–100.0)
PLATELETS: 275 10*3/uL (ref 150–400)
RBC: 5.5 MIL/uL (ref 4.22–5.81)
RDW: 13.5 % (ref 11.5–15.5)
WBC: 17 10*3/uL — ABNORMAL HIGH (ref 4.0–10.5)

## 2016-11-25 LAB — LIPASE, BLOOD: LIPASE: 21 U/L (ref 11–51)

## 2016-11-25 NOTE — ED Triage Notes (Signed)
C/o dull RUQ pain since noon today.  Feels bloated and thinks he is constipated.  States he had BM today but not very much.  Normal BM yesterday.  Pain radiates around right side to back and feels sharp in his back.  Vomited around 2pm.  Denies nausea at present.

## 2016-11-26 ENCOUNTER — Emergency Department (HOSPITAL_COMMUNITY)
Admission: EM | Admit: 2016-11-26 | Discharge: 2016-11-26 | Disposition: A | Discharge status: Home / Self Care | Payer: No Typology Code available for payment source | Attending: Emergency Medicine | Admitting: Emergency Medicine

## 2016-11-26 ENCOUNTER — Emergency Department (HOSPITAL_COMMUNITY): Payer: No Typology Code available for payment source

## 2016-11-26 DIAGNOSIS — R1011 Right upper quadrant pain: Secondary | ICD-10-CM

## 2016-11-26 DIAGNOSIS — K8080 Other cholelithiasis without obstruction: Secondary | ICD-10-CM

## 2016-11-26 HISTORY — DX: Essential (primary) hypertension: I10

## 2016-11-26 MED ORDER — HYDROMORPHONE HCL 1 MG/ML IJ SOLN
1.0000 mg | Freq: Once | INTRAMUSCULAR | Status: AC
  Administered 2016-11-26: 1 mg via INTRAVENOUS
  Filled 2016-11-26: qty 1

## 2016-11-26 MED ORDER — HYDROMORPHONE HCL 1 MG/ML IJ SOLN: 1.0000 mg | Freq: Once | INTRAMUSCULAR | Status: DC

## 2016-11-26 MED ORDER — SODIUM CHLORIDE 0.9 % IV BOLUS (SEPSIS)
1000.0000 mL | Freq: Once | INTRAVENOUS | Status: AC
  Administered 2016-11-26: 1000 mL via INTRAVENOUS

## 2016-11-26 MED ORDER — KETOROLAC TROMETHAMINE 30 MG/ML IJ SOLN
30.0000 mg | Freq: Once | INTRAMUSCULAR | Status: AC
  Administered 2016-11-26: 30 mg via INTRAVENOUS
  Filled 2016-11-26: qty 1

## 2016-11-26 MED ORDER — MORPHINE SULFATE (PF) 4 MG/ML IV SOLN
4.0000 mg | Freq: Once | INTRAVENOUS | Status: AC
  Administered 2016-11-26: 4 mg via INTRAVENOUS
  Filled 2016-11-26: qty 1

## 2016-11-26 MED ORDER — ONDANSETRON 4 MG PO TBDP: 4.0000 mg | ORAL_TABLET | Freq: Three times a day (TID) | ORAL | 0 refills | Status: DC | PRN

## 2016-11-26 MED ORDER — OXYCODONE-ACETAMINOPHEN 5-325 MG PO TABS: 1.0000 | ORAL_TABLET | Freq: Three times a day (TID) | ORAL | 0 refills | Status: DC | PRN

## 2016-11-26 MED ORDER — ONDANSETRON HCL 4 MG/2ML IJ SOLN
4.0000 mg | Freq: Once | INTRAMUSCULAR | Status: AC
  Administered 2016-11-26: 4 mg via INTRAVENOUS
  Filled 2016-11-26: qty 2

## 2016-11-26 NOTE — Discharge Instructions (Signed)
Please read attached information regarding your condition. Take Zofran as needed for nausea. Take pain medication as needed for severe pain. Follow-up with general surgeon listed below for further evaluation. Return to ED for worsening pain, increased vomiting, fevers.

## 2016-11-26 NOTE — ED Provider Notes (Signed)
[facility] Provider Note   CSN: [id number] Arrival date & time: 11/25/16  2223     History   Chief Complaint Chief Complaint  Patient presents with  . Abdominal Pain    HPI Bobby Kelly is a 36 y.o. male with past medical history of hypertension, who presents to ED for evaluation of right upper quadrant pain for the past 16 hours. States that pain began after he ate his lunch. Located on right upper quadrant and states that it is dull. He reports normal bowel movements. He also reports several episodes of vomiting and nausea. He denies any previous history of similar symptoms. Previous abdominal surgeries include appendectomy several years ago. He has not tried any medications to help with pain. He denies any chest pain, shortness of breath, hematemesis, hematochezia, urinary symptoms.  HPI  Past Medical History:  Diagnosis Date  . Allergy   . Dry skin    nasal folds  . Hypertension   . Thyroid disease    hyperactive in high school, had oral therapy.    . Wears contact lenses     Patient Active Problem List   Diagnosis Date Noted  . Routine general medical examination at a health care facility 11/03/2016  . Need for influenza vaccination 11/03/2016  . Class 2 obesity with serious comorbidity and body mass index (BMI) of 36.0 to 36.9 in adult 11/03/2016  . Vaccine counseling 11/03/2016  . Heart disease 11/03/2016  . Palpitation 11/03/2016  . Constipation 04/28/2016    Past Surgical History:  Procedure Laterality Date  . APPENDECTOMY         Home Medications    Prior to Admission medications   Medication Sig Start Date End Date Taking? Authorizing Provider  lisinopril (PRINIVIL,ZESTRIL) 20 MG tablet Take 1 tablet (20 mg total) by mouth daily. 10/17/16 01/15/17 Yes Camnitz, Will Daphine DeutscherMartin, MD  metoprolol succinate (TOPROL-XL) 50 MG 24 hr tablet Take 1 tablet (50 mg total) by mouth daily. Take with or immediately following a meal.  10/17/16  Yes Camnitz, Will Daphine DeutscherMartin, MD  pravastatin (PRAVACHOL) 20 MG tablet Take 1 tablet (20 mg total) by mouth every evening. 11/07/16 11/07/17 Yes Tysinger, Kermit Baloavid S, PA-C  ondansetron (ZOFRAN ODT) 4 MG disintegrating tablet Take 1 tablet (4 mg total) by mouth every 8 (eight) hours as needed for nausea or vomiting. 11/26/16   Atia Haupt, PA-C  oxyCODONE-acetaminophen (PERCOCET/ROXICET) 5-325 MG tablet Take 1 tablet by mouth every 8 (eight) hours as needed for severe pain. 11/26/16   Dietrich PatesKhatri, Paisley Grajeda, PA-C    Family History Family History  Problem Relation Age of Onset  . Hypertension Mother   . Cataracts Mother   . Hyperlipidemia Father   . Pancreatic cancer Maternal Grandmother   . Cancer Maternal Grandmother   . Diabetes Other   . Heart disease Neg Hx   . Stroke Neg Hx     Social History Social History  Substance Use Topics  . Smoking status: Never Smoker  . Smokeless tobacco: Never Used  . Alcohol use 2.4 oz/week    3 Cans of beer, 1 Shots of liquor per week     Comment: occ, history of 2-3 years of heavier alcohol     Allergies   Patient has no known allergies.   Review of Systems Review of Systems  Constitutional: Negative for appetite change, chills and fever.  HENT: Negative for ear pain, rhinorrhea, sneezing and sore throat.   Eyes: Negative for photophobia and visual disturbance.  Respiratory: Negative for cough, chest tightness, shortness of breath and wheezing.   Cardiovascular: Negative for chest pain and palpitations.  Gastrointestinal: Positive for abdominal pain, constipation, nausea and vomiting. Negative for blood in stool and diarrhea.  Genitourinary: Negative for dysuria, hematuria and urgency.  Musculoskeletal: Negative for myalgias.  Skin: Negative for rash.  Neurological: Negative for dizziness, weakness and light-headedness.     Physical Exam Updated Vital Signs BP (!) 136/92   Pulse (!) 114   Temp 98 F (36.7 C) (Oral)   Resp (!) 22   SpO2  99%   Physical Exam  Constitutional: He appears well-developed and well-nourished. No distress.  HENT:  Head: Normocephalic and atraumatic.  Nose: Nose normal.  Eyes: Conjunctivae and EOM are normal. Left eye exhibits no discharge. No scleral icterus.  Neck: Normal range of motion. Neck supple.  Cardiovascular: Normal rate, regular rhythm, normal heart sounds and intact distal pulses.  Exam reveals no gallop and no friction rub.   No murmur heard. Pulmonary/Chest: Effort normal and breath sounds normal. No respiratory distress.  Abdominal: Soft. Bowel sounds are normal. He exhibits no distension. There is tenderness in the right upper quadrant. There is no guarding.    Musculoskeletal: Normal range of motion. He exhibits no edema.  Neurological: He is alert. He exhibits normal muscle tone. Coordination normal.  Skin: Skin is warm and dry. No rash noted.  Psychiatric: He has a normal mood and affect.  Nursing note and vitals reviewed.    ED Treatments / Results  Labs (all labs ordered are listed, but only abnormal results are displayed) Labs Reviewed  COMPREHENSIVE METABOLIC PANEL - Abnormal; Notable for the following:       Result Value   Sodium 133 (*)    Chloride 98 (*)    Glucose, Bld 165 (*)    Total Protein 8.3 (*)    AST 330 (*)    ALT 215 (*)    All other components within normal limits  CBC - Abnormal; Notable for the following:    WBC 17.0 (*)    Hemoglobin 17.3 (*)    All other components within normal limits  URINALYSIS, ROUTINE W REFLEX MICROSCOPIC - Abnormal; Notable for the following:    Protein, ur 30 (*)    Bacteria, UA RARE (*)    All other components within normal limits  LIPASE, BLOOD    EKG  EKG Interpretation None       Radiology US Abdomen Limited Ruq  Result Date: 11/26/2016 CLINICAL DATA:  Right upper quadrant pain since noon yesterday. EXAM: ULTRASOUND ABDOMEN LIMITED RIGHT UPPER QUADRANT COMPARISON:  None. FINDINGS: Gallbladder:  Multiple stones in the gallbladder, largest measuring about 1.7 cm diameter. No gallbladder wall thickening or edema. Murphy's sign is negative. Common bile duct: Diameter: 3.4 mm, normal Liver: Diffusely increased parenchymal echotexture suggesting fatty infiltration. No focal liver lesions identified. Portal vein is patent on color Doppler imaging with normal direction of blood flow towards the liver. IMPRESSION: Cholelithiasis.  No additional changes to suggest cholecystitis. Electronically Signed   By: Burman Nieves M.D.   On: 11/26/2016 04:44    Procedures Procedures (including critical care time)  Medications Ordered in ED Medications  morphine 4 MG/ML injection 4 mg (4 mg Intravenous Given 11/26/16 0558)  sodium chloride 0.9 % bolus 1,000 mL (0 mLs Intravenous Stopped 11/26/16 0804)  ondansetron (ZOFRAN) injection 4 mg (4 mg Intravenous Given 11/26/16 0558)  HYDROmorphone (DILAUDID) injection 1 mg (1 mg Intravenous Given 11/26/16 0726)  sodium chloride 0.9 % bolus 1,000 mL (1,000 mLs Intravenous New Bag/Given 11/26/16 0804)     Initial Impression / Assessment and Plan / ED Course  I have reviewed the triage vital signs and the nursing notes.  Pertinent labs & imaging results that were available during my care of the patient were reviewed by me and considered in my medical decision making (see chart for details).     Patient, with a past medical history of hypertension, presents to ED for evaluation of acute onset of right upper quadrant pain after eating lunch today. Associated with several episodes of vomiting. He is afebrile with no history of fever. Lab work shows leukocytosis at 17 and increase in LFTs. Lipase unremarkable. Urinalysis is no evidence of UTI. Right upper quadrant abdominal ultrasound showed cholelithiasis. I suspect that this is causing his pain. Pain controlled here in the ED with pain medication and nausea medication. Patient given fluids. We'll discharge with  surgery follow-up, nausea and pain medications to take home. I advised him to follow-up with surgery for further evaluation. Patient stable for discharge at this time. Strict return precautions given.  Final Clinical Impressions(s) / ED Diagnoses   Final diagnoses:  RUQ pain  Biliary calculus of other site without obstruction    New Prescriptions New Prescriptions   ONDANSETRON (ZOFRAN ODT) 4 MG DISINTEGRATING TABLET    Take 1 tablet (4 mg total) by mouth every 8 (eight) hours as needed for nausea or vomiting.   OXYCODONE-ACETAMINOPHEN (PERCOCET/ROXICET) 5-325 MG TABLET    Take 1 tablet by mouth every 8 (eight) hours as needed for severe pain.     Dietrich Pates, PA-C 11/26/16 1610    Zadie Rhine, MD 11/27/16 [phone]

## 2016-11-26 NOTE — ED Provider Notes (Signed)
Care assumed from  [facility], PA-C at shift change with plans for discharge after fluids.   Please see note from [facility] for full evaluation.   Patient reported some worsening abdominal pain. Additional pain medications given.    Re-eval after fluids and pain medication. Patient reports improvement in pain. He is walking around the department without any difficulty. Mild tenderness on abdominal exam. No peritoneal signs. Patient stable for discharge at this time. Strict return precautions discussed. Patient expresses understanding and agreement to plan.     1. Biliary calculus of other site without obstruction   2. RUQ pain       Rosana Hoes 11/26/16 8811    Ward, Layla Maw, DO 11/26/16 1115

## 2016-11-28 ENCOUNTER — Telehealth: Payer: Self-pay | Admitting: Medical

## 2016-11-28 NOTE — Telephone Encounter (Signed)
Pt has surgery scheduled on 12/19/16. He did not go into details about surgery and what surgery was for. Pt is requesting to speak to Vincenza Hews to get advise about getting the surgery done sooner.

## 2016-11-29 ENCOUNTER — Other Ambulatory Visit: Payer: Self-pay | Admitting: Medical

## 2016-11-29 DIAGNOSIS — K820 Obstruction of gallbladder: Secondary | ICD-10-CM

## 2016-11-29 NOTE — Telephone Encounter (Signed)
Bobby Kelly Please get him in for f/u this week, labs Go ahead and scheduled HIDA scan ASAP   I called and spoke to him after reviewing results from recent ED visit.

## 2016-11-29 NOTE — Telephone Encounter (Signed)
Pt has been set up for HIDA on  thurs. 12/01/2016 pt is aware of appt . O/v on Friday the 12/02/2016.

## 2016-11-29 NOTE — Telephone Encounter (Signed)
Pt called again to f/u since he has not received a return call yet.

## 2016-12-01 ENCOUNTER — Ambulatory Visit (HOSPITAL_COMMUNITY)
Admission: RE | Admit: 2016-12-01 | Discharge: 2016-12-01 | Disposition: A | Discharge status: Home / Self Care | Payer: No Typology Code available for payment source | Source: Ambulatory Visit | Attending: Medical | Admitting: Medical

## 2016-12-01 DIAGNOSIS — K820 Obstruction of gallbladder: Secondary | ICD-10-CM

## 2016-12-01 MED ORDER — MORPHINE SULFATE (PF) 4 MG/ML IV SOLN
INTRAVENOUS | Status: AC
  Administered 2016-12-01: 3.67 mg
  Filled 2016-12-01: qty 1

## 2016-12-01 MED ORDER — TECHNETIUM TC 99M MEBROFENIN IV KIT
5.1000 | PACK | Freq: Once | INTRAVENOUS | Status: AC | PRN
  Administered 2016-12-01: 5.1 via INTRAVENOUS

## 2016-12-01 MED ORDER — SODIUM CHLORIDE 0.9 % IV SOLN: 3.6700 mg/h | Freq: Once | INTRAVENOUS | Status: DC

## 2016-12-01 MED ORDER — MORPHINE SULFATE (PF) 4 MG/ML IV SOLN: 3.6700 mg | Freq: Once | INTRAVENOUS | Status: DC

## 2016-12-02 ENCOUNTER — Inpatient Hospital Stay (HOSPITAL_COMMUNITY)
Admission: AD | Admit: 2016-12-02 | Discharge: 2016-12-25 | DRG: 417 | Disposition: A | Discharge status: Home / Self Care | Payer: No Typology Code available for payment source | Source: Ambulatory Visit | Attending: General Surgery | Admitting: General Surgery

## 2016-12-02 ENCOUNTER — Ambulatory Visit: Payer: Self-pay | Admitting: Medical

## 2016-12-02 ENCOUNTER — Other Ambulatory Visit: Payer: Self-pay | Admitting: Surgery

## 2016-12-02 DIAGNOSIS — K9189 Other postprocedural complications and disorders of digestive system: Secondary | ICD-10-CM

## 2016-12-02 DIAGNOSIS — Y838 Other surgical procedures as the cause of abnormal reaction of the patient, or of later complication, without mention of misadventure at the time of the procedure: Secondary | ICD-10-CM | POA: Diagnosis not present

## 2016-12-02 DIAGNOSIS — J189 Pneumonia, unspecified organism: Secondary | ICD-10-CM | POA: Diagnosis present

## 2016-12-02 DIAGNOSIS — I428 Other cardiomyopathies: Secondary | ICD-10-CM | POA: Diagnosis present

## 2016-12-02 DIAGNOSIS — J392 Other diseases of pharynx: Secondary | ICD-10-CM | POA: Diagnosis not present

## 2016-12-02 DIAGNOSIS — Z539 Procedure and treatment not carried out, unspecified reason: Secondary | ICD-10-CM | POA: Diagnosis not present

## 2016-12-02 DIAGNOSIS — K82A1 Gangrene of gallbladder in cholecystitis: Secondary | ICD-10-CM | POA: Diagnosis present

## 2016-12-02 DIAGNOSIS — N990 Postprocedural (acute) (chronic) kidney failure: Secondary | ICD-10-CM | POA: Diagnosis not present

## 2016-12-02 DIAGNOSIS — E785 Hyperlipidemia, unspecified: Secondary | ICD-10-CM | POA: Diagnosis present

## 2016-12-02 DIAGNOSIS — E079 Disorder of thyroid, unspecified: Secondary | ICD-10-CM | POA: Diagnosis present

## 2016-12-02 DIAGNOSIS — I5032 Chronic diastolic (congestive) heart failure: Secondary | ICD-10-CM | POA: Diagnosis present

## 2016-12-02 DIAGNOSIS — N179 Acute kidney failure, unspecified: Secondary | ICD-10-CM | POA: Diagnosis not present

## 2016-12-02 DIAGNOSIS — R188 Other ascites: Secondary | ICD-10-CM

## 2016-12-02 DIAGNOSIS — J9811 Atelectasis: Secondary | ICD-10-CM | POA: Diagnosis not present

## 2016-12-02 DIAGNOSIS — Y95 Nosocomial condition: Secondary | ICD-10-CM | POA: Diagnosis not present

## 2016-12-02 DIAGNOSIS — K838 Other specified diseases of biliary tract: Secondary | ICD-10-CM

## 2016-12-02 DIAGNOSIS — K668 Other specified disorders of peritoneum: Secondary | ICD-10-CM

## 2016-12-02 DIAGNOSIS — J90 Pleural effusion, not elsewhere classified: Secondary | ICD-10-CM

## 2016-12-02 DIAGNOSIS — Z79899 Other long term (current) drug therapy: Secondary | ICD-10-CM

## 2016-12-02 DIAGNOSIS — D62 Acute posthemorrhagic anemia: Secondary | ICD-10-CM | POA: Diagnosis not present

## 2016-12-02 DIAGNOSIS — I429 Cardiomyopathy, unspecified: Secondary | ICD-10-CM

## 2016-12-02 DIAGNOSIS — I110 Hypertensive heart disease with heart failure: Secondary | ICD-10-CM | POA: Diagnosis present

## 2016-12-02 DIAGNOSIS — R109 Unspecified abdominal pain: Secondary | ICD-10-CM

## 2016-12-02 DIAGNOSIS — T8189XA Other complications of procedures, not elsewhere classified, initial encounter: Secondary | ICD-10-CM

## 2016-12-02 DIAGNOSIS — I509 Heart failure, unspecified: Secondary | ICD-10-CM

## 2016-12-02 DIAGNOSIS — E871 Hypo-osmolality and hyponatremia: Secondary | ICD-10-CM | POA: Diagnosis not present

## 2016-12-02 DIAGNOSIS — K8020 Calculus of gallbladder without cholecystitis without obstruction: Secondary | ICD-10-CM | POA: Diagnosis present

## 2016-12-02 DIAGNOSIS — K2970 Gastritis, unspecified, without bleeding: Secondary | ICD-10-CM | POA: Diagnosis present

## 2016-12-02 DIAGNOSIS — K8012 Calculus of gallbladder with acute and chronic cholecystitis without obstruction: Principal | ICD-10-CM | POA: Diagnosis present

## 2016-12-02 DIAGNOSIS — K8000 Calculus of gallbladder with acute cholecystitis without obstruction: Secondary | ICD-10-CM

## 2016-12-02 DIAGNOSIS — R0602 Shortness of breath: Secondary | ICD-10-CM

## 2016-12-02 HISTORY — DX: Calculus of gallbladder without cholecystitis without obstruction: K80.20

## 2016-12-02 HISTORY — DX: Heart failure, unspecified: I50.9

## 2016-12-02 LAB — COMPREHENSIVE METABOLIC PANEL
ALBUMIN: 3.3 g/dL — AB (ref 3.5–5.0)
ALT: 432 U/L — ABNORMAL HIGH (ref 17–63)
AST: 196 U/L — AB (ref 15–41)
Alkaline Phosphatase: 324 U/L — ABNORMAL HIGH (ref 38–126)
Anion gap: 7 (ref 5–15)
BILIRUBIN TOTAL: 2 mg/dL — AB (ref 0.3–1.2)
BUN: 19 mg/dL (ref 6–20)
CHLORIDE: 102 mmol/L (ref 101–111)
CO2: 28 mmol/L (ref 22–32)
Calcium: 8.8 mg/dL — ABNORMAL LOW (ref 8.9–10.3)
Creatinine, Ser: 0.97 mg/dL (ref 0.61–1.24)
GFR calc Af Amer: >60 mL/min (ref >60)
GFR calc non Af Amer: >60 mL/min (ref >60)
GLUCOSE: 92 mg/dL (ref 65–99)
POTASSIUM: 4.3 mmol/L (ref 3.5–5.1)
SODIUM: 137 mmol/L (ref 135–145)
TOTAL PROTEIN: 7.4 g/dL (ref 6.5–8.1)

## 2016-12-02 LAB — CBC
HEMATOCRIT: 45.3 % (ref 39.0–52.0)
Hemoglobin: 15.8 g/dL (ref 13.0–17.0)
MCH: 32.2 pg (ref 26.0–34.0)
MCHC: 34.9 g/dL (ref 30.0–36.0)
MCV: 92.4 fL (ref 78.0–100.0)
Platelets: 326 10*3/uL (ref 150–400)
RBC: 4.9 MIL/uL (ref 4.22–5.81)
RDW: 14.5 % (ref 11.5–15.5)
WBC: 10 10*3/uL (ref 4.0–10.5)

## 2016-12-02 LAB — SURGICAL PCR SCREEN
MRSA, PCR: NEGATIVE
Staphylococcus aureus: POSITIVE — AB

## 2016-12-02 MED ORDER — POTASSIUM CHLORIDE IN NACL 20-0.9 MEQ/L-% IV SOLN
INTRAVENOUS | Status: DC
  Administered 2016-12-02: 1 mL via INTRAVENOUS
  Administered 2016-12-03 – 2016-12-05 (×7): via INTRAVENOUS
  Filled 2016-12-02 (×8): qty 1000

## 2016-12-02 MED ORDER — DEXTROSE 5 % IV SOLN
2.0000 g | INTRAVENOUS | Status: DC
  Administered 2016-12-02 – 2016-12-06 (×5): 2 g via INTRAVENOUS
  Filled 2016-12-02 (×6): qty 2

## 2016-12-02 MED ORDER — CHLORHEXIDINE GLUCONATE CLOTH 2 % EX PADS
6.0000 | MEDICATED_PAD | Freq: Once | CUTANEOUS | Status: DC
  Administered 2016-12-02: 6 via TOPICAL

## 2016-12-02 MED ORDER — ONDANSETRON HCL 4 MG/2ML IJ SOLN
4.0000 mg | Freq: Four times a day (QID) | INTRAMUSCULAR | Status: DC | PRN
  Administered 2016-12-02 – 2016-12-19 (×8): 4 mg via INTRAVENOUS
  Filled 2016-12-02 (×7): qty 2

## 2016-12-02 MED ORDER — HYDROMORPHONE HCL 1 MG/ML IJ SOLN
1.0000 mg | INTRAMUSCULAR | Status: DC | PRN
  Administered 2016-12-02 – 2016-12-13 (×46): 1 mg via INTRAVENOUS
  Filled 2016-12-02 (×50): qty 1

## 2016-12-02 MED ORDER — ENOXAPARIN SODIUM 40 MG/0.4ML ~~LOC~~ SOLN: 40.0000 mg | SUBCUTANEOUS | Status: DC

## 2016-12-02 MED ORDER — ONDANSETRON 4 MG PO TBDP
4.0000 mg | ORAL_TABLET | Freq: Four times a day (QID) | ORAL | Status: DC | PRN
  Administered 2016-12-21 – 2016-12-23 (×3): 4 mg via ORAL
  Filled 2016-12-02 (×4): qty 1

## 2016-12-02 MED ORDER — CHLORHEXIDINE GLUCONATE CLOTH 2 % EX PADS: 6.0000 | MEDICATED_PAD | Freq: Once | CUTANEOUS | Status: DC

## 2016-12-02 NOTE — H&P (Signed)
Bobby Kelly is an 36 y.o. male.   Chief Complaint: right upper quadrant abdominal pain HPI: this is a 36 year old gentleman referred by the emergency department for symptomatic gallstones.  He was actually seen there on October 20.  At that time he had right upper quadrant tenderness with an elevated white blood count.  An ultrasound showed gallstones.  His liver function tests were slightly elevated as well.  He apparently improved slightly and was discharged from the emergency department.   Yesterday he underwent a HIDA scan showing nonvisualization of the gallbladder.  He is here today complaining of right upper quadrant abdominal pain.  He had an admission in August for chest pain and was found to have a 44% cardiac ejection fraction.  Past Medical History:  Diagnosis Date  . Allergy   . Dry skin    nasal folds  . Hypertension   . Thyroid disease    hyperactive in high school, had oral therapy.    . Wears contact lenses     Past Surgical History:  Procedure Laterality Date  . APPENDECTOMY      Family History  Problem Relation Age of Onset  . Hypertension Mother   . Cataracts Mother   . Hyperlipidemia Father   . Pancreatic cancer Maternal Grandmother   . Cancer Maternal Grandmother   . Diabetes Other   . Heart disease Neg Hx   . Stroke Neg Hx    Social History:  reports that he has never smoked. He has never used smokeless tobacco. He reports that he drinks about 2.4 oz of alcohol per week . He reports that he does not use drugs.  Allergies: No Known Allergies   (Not in a hospital admission)  No results found for this or any previous visit (from the past 48 hour(s)). Nm Hepato W/eject Fract  Result Date: 12/01/2016 CLINICAL DATA:  Nausea, vomiting, intolerance to fatty foods EXAM: NUCLEAR MEDICINE HEPATOBILIARY IMAGING TECHNIQUE: Sequential images of the abdomen were obtained out to 60 minutes following intravenous administration of radiopharmaceutical.  RADIOPHARMACEUTICALS:  5.1 mCi Tc-3067m  Choletec IV COMPARISON:  None. FINDINGS: Prompt uptake and biliary excretion of activity by the liver is seen. The gallbladder, however, is not visualized. The nuclear medicine technologist discussed the case Dr. Chestine Sporelark who advised giving 3.7 mg of morphine after 90 minutes of imaging. After administration of the morphine, no activity is seen within the gallbladder. These findings are highly suspicious for cystic duct obstruction. IMPRESSION: The gallbladder is not visualized even after administration of morphine, worrisome for cystic duct obstruction and probable developing acute cholecystitis. Electronically Signed   By: Dwyane DeePaul  Barry M.D.   On: 12/01/2016 14:31    Review of Systems  Respiratory: Negative for shortness of breath.   Cardiovascular: Negative for chest pain.  Gastrointestinal: Positive for abdominal pain and nausea.    There were no vitals taken for this visit. Physical Exam  Constitutional: He is oriented to person, place, and time. He appears well-developed and well-nourished. He appears distressed.  HENT:  Head: Normocephalic and atraumatic.  Left Ear: External ear normal.  Eyes: Pupils are equal, round, and reactive to light. Conjunctivae are normal.  Neck: Normal range of motion.  Cardiovascular: Normal rate, regular rhythm and normal heart sounds.   Respiratory: Breath sounds normal. No respiratory distress.  GI: Soft. There is tenderness. There is guarding.  There is moderate to severe tenderness with guarding in the right upper rant  Musculoskeletal: Normal range of motion. He exhibits no edema.  Lymphadenopathy:    He has no cervical adenopathy.  Neurological: He is alert and oriented to person, place, and time.  Skin: Skin is warm and dry. No erythema.  Psychiatric: His behavior is normal. Judgment normal.     Assessment/Plan Acute cholecystitis with cholelithiasis  I discussed the diagnosis with patient in detail.  I am  recommending admission to the hospital for IV antibiotics and cholecystectomy this admission.  He may or may not need some cardiac clearance preoperatively.  He is in agreement with the plan  [facility], MD 12/02/2016, 11:11 AM

## 2016-12-02 NOTE — Progress Notes (Addendum)
Surgery:  History reviewed with patient and Dr. Abigail Miyamoto. Was in the ER 1 week ago and discharged home, but has had daily pain. Ultrasound 12/27/2016 showed multiple stones but no inflammation. HIDA scan yesterday showed nonvisualized gallbladder, even after morphine. WBC was 17,000 on 10/20.  WBC 10,000 today. AST and ALT were elevated on 10/20....  LFTs today are pending  Evaluated by cardiology 1 month ago for palpitations and HTN with good-looking echocardiogram except for EF 44%. (grade II diastolic dysfunctiojn)... He runs 1 mile a day without chest pain or shortness of breath.  He's been evaluated by cardiology as an outpatient.   he is acceptable risk for surgery.  On exam he is comfortable and does not appear acutely ill. Afebrile No tachycardia, but on beta blockers Abdomen-soft.  Not distended.  Subjectively tender in right upper quadrant but no guarding or palpable mass.  Assessment/plan: Suspect smoldering acute cholecystitis with cholelithiasis Borderline CHF(gr. II diastolic dysfunction) with EF 93%. However, asymptomatic with excellent exercise tolerance   Nursing staff to start IV now, begin IV Dilaudid and IV Rocephin Nothing by mouth Proceed with laparoscopic cholecystectomy with cholangiogram by Dr. Violeta Gelinas tomorrow Dr/ Janee Morn aware. I discussed the indications, techniques, and risk of the surgery with him in detail.  He agrees with this plan.  OR notified   Leanette Eutsler M. Derrell Lolling, M.D., [facility] General and Minimally invasive Surgery Breast and Colorectal Surgery

## 2016-12-03 ENCOUNTER — Encounter (HOSPITAL_COMMUNITY): Payer: Self-pay | Admitting: *Deleted

## 2016-12-03 ENCOUNTER — Observation Stay (HOSPITAL_COMMUNITY): Payer: No Typology Code available for payment source | Admitting: Anesthesiology

## 2016-12-03 ENCOUNTER — Encounter (HOSPITAL_COMMUNITY): Admission: AD | Disposition: A | Discharge status: Home / Self Care | Payer: Self-pay | Source: Ambulatory Visit

## 2016-12-03 DIAGNOSIS — J9811 Atelectasis: Secondary | ICD-10-CM | POA: Diagnosis not present

## 2016-12-03 DIAGNOSIS — I5032 Chronic diastolic (congestive) heart failure: Secondary | ICD-10-CM | POA: Diagnosis present

## 2016-12-03 DIAGNOSIS — D62 Acute posthemorrhagic anemia: Secondary | ICD-10-CM | POA: Diagnosis not present

## 2016-12-03 DIAGNOSIS — E079 Disorder of thyroid, unspecified: Secondary | ICD-10-CM | POA: Diagnosis present

## 2016-12-03 DIAGNOSIS — K2970 Gastritis, unspecified, without bleeding: Secondary | ICD-10-CM | POA: Diagnosis present

## 2016-12-03 DIAGNOSIS — K8012 Calculus of gallbladder with acute and chronic cholecystitis without obstruction: Secondary | ICD-10-CM | POA: Diagnosis present

## 2016-12-03 DIAGNOSIS — Y95 Nosocomial condition: Secondary | ICD-10-CM | POA: Diagnosis not present

## 2016-12-03 DIAGNOSIS — I428 Other cardiomyopathies: Secondary | ICD-10-CM | POA: Diagnosis present

## 2016-12-03 DIAGNOSIS — J392 Other diseases of pharynx: Secondary | ICD-10-CM | POA: Diagnosis not present

## 2016-12-03 DIAGNOSIS — E871 Hypo-osmolality and hyponatremia: Secondary | ICD-10-CM | POA: Diagnosis not present

## 2016-12-03 DIAGNOSIS — Y838 Other surgical procedures as the cause of abnormal reaction of the patient, or of later complication, without mention of misadventure at the time of the procedure: Secondary | ICD-10-CM | POA: Diagnosis not present

## 2016-12-03 DIAGNOSIS — Z79899 Other long term (current) drug therapy: Secondary | ICD-10-CM | POA: Diagnosis not present

## 2016-12-03 DIAGNOSIS — N990 Postprocedural (acute) (chronic) kidney failure: Secondary | ICD-10-CM | POA: Diagnosis not present

## 2016-12-03 DIAGNOSIS — I110 Hypertensive heart disease with heart failure: Secondary | ICD-10-CM | POA: Diagnosis present

## 2016-12-03 DIAGNOSIS — J189 Pneumonia, unspecified organism: Secondary | ICD-10-CM | POA: Diagnosis not present

## 2016-12-03 DIAGNOSIS — E785 Hyperlipidemia, unspecified: Secondary | ICD-10-CM | POA: Diagnosis present

## 2016-12-03 DIAGNOSIS — N179 Acute kidney failure, unspecified: Secondary | ICD-10-CM | POA: Diagnosis not present

## 2016-12-03 DIAGNOSIS — J90 Pleural effusion, not elsewhere classified: Secondary | ICD-10-CM | POA: Diagnosis not present

## 2016-12-03 DIAGNOSIS — K9189 Other postprocedural complications and disorders of digestive system: Secondary | ICD-10-CM | POA: Diagnosis not present

## 2016-12-03 DIAGNOSIS — Z539 Procedure and treatment not carried out, unspecified reason: Secondary | ICD-10-CM | POA: Diagnosis not present

## 2016-12-03 DIAGNOSIS — K82A1 Gangrene of gallbladder in cholecystitis: Secondary | ICD-10-CM | POA: Diagnosis present

## 2016-12-03 HISTORY — PX: CHOLECYSTECTOMY: SHX55

## 2016-12-03 SURGERY — LAPAROSCOPIC CHOLECYSTECTOMY WITH INTRAOPERATIVE CHOLANGIOGRAM
Anesthesia: General | Site: Abdomen

## 2016-12-03 MED ORDER — LIDOCAINE 2% (20 MG/ML) 5 ML SYRINGE
INTRAMUSCULAR | Status: AC
  Filled 2016-12-03: qty 5

## 2016-12-03 MED ORDER — 0.9 % SODIUM CHLORIDE (POUR BTL) OPTIME
TOPICAL | Status: DC | PRN
  Administered 2016-12-03: 1000 mL

## 2016-12-03 MED ORDER — HYDROMORPHONE HCL 1 MG/ML IJ SOLN
INTRAMUSCULAR | Status: AC
  Filled 2016-12-03: qty 1

## 2016-12-03 MED ORDER — ALUM & MAG HYDROXIDE-SIMETH 200-200-20 MG/5ML PO SUSP
30.0000 mL | ORAL | Status: DC | PRN
  Administered 2016-12-07 – 2016-12-21 (×7): 30 mL via ORAL
  Filled 2016-12-03 (×8): qty 30

## 2016-12-03 MED ORDER — CHLORHEXIDINE GLUCONATE CLOTH 2 % EX PADS
6.0000 | MEDICATED_PAD | Freq: Every day | CUTANEOUS | Status: AC
  Administered 2016-12-03 – 2016-12-07 (×5): 6 via TOPICAL

## 2016-12-03 MED ORDER — OXYCODONE HCL 5 MG PO TABS: 5.0000 mg | ORAL_TABLET | Freq: Once | ORAL | Status: DC | PRN

## 2016-12-03 MED ORDER — BUPIVACAINE-EPINEPHRINE 0.25% -1:200000 IJ SOLN
INTRAMUSCULAR | Status: DC | PRN
  Administered 2016-12-03: 20 mL

## 2016-12-03 MED ORDER — PROPOFOL 10 MG/ML IV BOLUS
INTRAVENOUS | Status: AC
  Filled 2016-12-03: qty 20

## 2016-12-03 MED ORDER — NEOSTIGMINE METHYLSULFATE 5 MG/5ML IV SOSY
PREFILLED_SYRINGE | INTRAVENOUS | Status: AC
  Filled 2016-12-03: qty 5

## 2016-12-03 MED ORDER — FENTANYL CITRATE (PF) 250 MCG/5ML IJ SOLN
INTRAMUSCULAR | Status: AC
  Filled 2016-12-03: qty 5

## 2016-12-03 MED ORDER — ONDANSETRON HCL 4 MG/2ML IJ SOLN
INTRAMUSCULAR | Status: AC
  Filled 2016-12-03: qty 2

## 2016-12-03 MED ORDER — FENTANYL CITRATE (PF) 100 MCG/2ML IJ SOLN
INTRAMUSCULAR | Status: DC | PRN
  Administered 2016-12-03 (×3): 50 ug via INTRAVENOUS
  Administered 2016-12-03 (×2): 100 ug via INTRAVENOUS
  Administered 2016-12-03: 50 ug via INTRAVENOUS
  Administered 2016-12-03: 100 ug via INTRAVENOUS

## 2016-12-03 MED ORDER — SODIUM CHLORIDE 0.9 % IR SOLN
Status: DC | PRN
  Administered 2016-12-03: 1000 mL
  Administered 2016-12-03: 3000 mL

## 2016-12-03 MED ORDER — LACTATED RINGERS IV SOLN
INTRAVENOUS | Status: DC
  Administered 2016-12-03 (×2): via INTRAVENOUS

## 2016-12-03 MED ORDER — ROCURONIUM BROMIDE 10 MG/ML (PF) SYRINGE
PREFILLED_SYRINGE | INTRAVENOUS | Status: AC
  Filled 2016-12-03: qty 5

## 2016-12-03 MED ORDER — MUPIROCIN 2 % EX OINT
1.0000 "application " | TOPICAL_OINTMENT | Freq: Two times a day (BID) | CUTANEOUS | Status: AC
  Administered 2016-12-03 – 2016-12-07 (×10): 1 via NASAL
  Filled 2016-12-03 (×2): qty 22

## 2016-12-03 MED ORDER — BUPIVACAINE-EPINEPHRINE (PF) 0.25% -1:200000 IJ SOLN
INTRAMUSCULAR | Status: AC
  Filled 2016-12-03: qty 30

## 2016-12-03 MED ORDER — LIDOCAINE HCL (CARDIAC) 20 MG/ML IV SOLN
INTRAVENOUS | Status: DC | PRN
  Administered 2016-12-03: 80 mg via INTRAVENOUS

## 2016-12-03 MED ORDER — ROCURONIUM BROMIDE 100 MG/10ML IV SOLN
INTRAVENOUS | Status: DC | PRN
  Administered 2016-12-03: 50 mg via INTRAVENOUS
  Administered 2016-12-03: 20 mg via INTRAVENOUS
  Administered 2016-12-03: 10 mg via INTRAVENOUS

## 2016-12-03 MED ORDER — EPHEDRINE 5 MG/ML INJ
INTRAVENOUS | Status: AC
  Filled 2016-12-03: qty 10

## 2016-12-03 MED ORDER — OXYCODONE HCL 5 MG PO TABS
5.0000 mg | ORAL_TABLET | ORAL | Status: DC | PRN
  Administered 2016-12-03 (×2): 10 mg via ORAL
  Administered 2016-12-03: 5 mg via ORAL
  Administered 2016-12-04 – 2016-12-05 (×5): 10 mg via ORAL
  Administered 2016-12-05: 5 mg via ORAL
  Filled 2016-12-03: qty 2
  Filled 2016-12-03: qty 1
  Filled 2016-12-03 (×3): qty 2
  Filled 2016-12-03: qty 1
  Filled 2016-12-03 (×3): qty 2

## 2016-12-03 MED ORDER — DEXAMETHASONE SODIUM PHOSPHATE 10 MG/ML IJ SOLN
INTRAMUSCULAR | Status: AC
  Filled 2016-12-03: qty 1

## 2016-12-03 MED ORDER — GLYCOPYRROLATE 0.2 MG/ML IJ SOLN
INTRAMUSCULAR | Status: DC | PRN
  Administered 2016-12-03: 0.4 mg via INTRAVENOUS

## 2016-12-03 MED ORDER — MIDAZOLAM HCL 5 MG/5ML IJ SOLN
INTRAMUSCULAR | Status: DC | PRN
  Administered 2016-12-03: 2 mg via INTRAVENOUS

## 2016-12-03 MED ORDER — METOPROLOL TARTRATE 5 MG/5ML IV SOLN
INTRAVENOUS | Status: DC | PRN
  Administered 2016-12-03 (×5): 1 mg via INTRAVENOUS

## 2016-12-03 MED ORDER — DEXAMETHASONE SODIUM PHOSPHATE 10 MG/ML IJ SOLN
INTRAMUSCULAR | Status: DC | PRN
  Administered 2016-12-03: 10 mg via INTRAVENOUS

## 2016-12-03 MED ORDER — HYDROMORPHONE HCL 1 MG/ML IJ SOLN
0.2500 mg | INTRAMUSCULAR | Status: DC | PRN
  Administered 2016-12-03 (×4): 0.5 mg via INTRAVENOUS

## 2016-12-03 MED ORDER — ONDANSETRON HCL 4 MG/2ML IJ SOLN: 4.0000 mg | Freq: Once | INTRAMUSCULAR | Status: DC | PRN

## 2016-12-03 MED ORDER — OXYCODONE HCL 5 MG/5ML PO SOLN: 5.0000 mg | Freq: Once | ORAL | Status: DC | PRN

## 2016-12-03 MED ORDER — METOPROLOL TARTRATE 5 MG/5ML IV SOLN
INTRAVENOUS | Status: AC
  Filled 2016-12-03: qty 5

## 2016-12-03 MED ORDER — PROPOFOL 10 MG/ML IV BOLUS
INTRAVENOUS | Status: DC | PRN
  Administered 2016-12-03: 200 mg via INTRAVENOUS

## 2016-12-03 MED ORDER — IOPAMIDOL (ISOVUE-300) INJECTION 61%
INTRAVENOUS | Status: AC
  Filled 2016-12-03: qty 50

## 2016-12-03 MED ORDER — MIDAZOLAM HCL 2 MG/2ML IJ SOLN
INTRAMUSCULAR | Status: AC
  Filled 2016-12-03: qty 2

## 2016-12-03 MED ORDER — NEOSTIGMINE METHYLSULFATE 10 MG/10ML IV SOLN
INTRAVENOUS | Status: DC | PRN
  Administered 2016-12-03: 3 mg via INTRAVENOUS

## 2016-12-03 SURGICAL SUPPLY — 44 items
APPLIER CLIP 5 13 M/L LIGAMAX5 (MISCELLANEOUS) ×2
BLADE CLIPPER SURG (BLADE) IMPLANT
CANISTER SUCT 3000ML PPV (MISCELLANEOUS) ×2 IMPLANT
CHLORAPREP W/TINT 26ML (MISCELLANEOUS) ×2 IMPLANT
CLIP APPLIE 5 13 M/L LIGAMAX5 (MISCELLANEOUS) ×1 IMPLANT
COVER MAYO STAND STRL (DRAPES) ×2 IMPLANT
COVER SURGICAL LIGHT HANDLE (MISCELLANEOUS) ×2 IMPLANT
DERMABOND ADHESIVE PROPEN (GAUZE/BANDAGES/DRESSINGS) ×1
DERMABOND ADVANCED (GAUZE/BANDAGES/DRESSINGS) ×1
DERMABOND ADVANCED .7 DNX12 (GAUZE/BANDAGES/DRESSINGS) ×1 IMPLANT
DERMABOND ADVANCED .7 DNX6 (GAUZE/BANDAGES/DRESSINGS) ×1 IMPLANT
DRAPE C-ARM 42X72 X-RAY (DRAPES) ×2 IMPLANT
ELECT REM PT RETURN 9FT ADLT (ELECTROSURGICAL) ×2
ELECTRODE REM PT RTRN 9FT ADLT (ELECTROSURGICAL) ×1 IMPLANT
FILTER SMOKE EVAC LAPAROSHD (FILTER) IMPLANT
GLOVE BIO SURGEON STRL SZ8 (GLOVE) ×2 IMPLANT
GLOVE BIOGEL PI IND STRL 8 (GLOVE) ×1 IMPLANT
GLOVE BIOGEL PI INDICATOR 8 (GLOVE) ×1
GOWN STRL REUS W/ TWL LRG LVL3 (GOWN DISPOSABLE) ×2 IMPLANT
GOWN STRL REUS W/ TWL XL LVL3 (GOWN DISPOSABLE) ×1 IMPLANT
GOWN STRL REUS W/TWL LRG LVL3 (GOWN DISPOSABLE) ×2
GOWN STRL REUS W/TWL XL LVL3 (GOWN DISPOSABLE) ×1
KIT BASIN OR (CUSTOM PROCEDURE TRAY) ×2 IMPLANT
KIT ROOM TURNOVER OR (KITS) ×2 IMPLANT
L-HOOK LAP DISP 36CM (ELECTROSURGICAL) ×2
LHOOK LAP DISP 36CM (ELECTROSURGICAL) ×1 IMPLANT
NEEDLE 22X1 1/2 (OR ONLY) (NEEDLE) ×2 IMPLANT
NS IRRIG 1000ML POUR BTL (IV SOLUTION) ×2 IMPLANT
PAD ARMBOARD 7.5X6 YLW CONV (MISCELLANEOUS) ×2 IMPLANT
PENCIL BUTTON HOLSTER BLD 10FT (ELECTRODE) ×2 IMPLANT
POUCH RETRIEVAL ECOSAC 10 (ENDOMECHANICALS) ×3 IMPLANT
POUCH RETRIEVAL ECOSAC 10MM (ENDOMECHANICALS) ×3
SCISSORS LAP 5X35 DISP (ENDOMECHANICALS) ×2 IMPLANT
SET CHOLANGIOGRAPH 5 50 .035 (SET/KITS/TRAYS/PACK) ×2 IMPLANT
SET IRRIG TUBING LAPAROSCOPIC (IRRIGATION / IRRIGATOR) ×2 IMPLANT
SLEEVE ENDOPATH XCEL 5M (ENDOMECHANICALS) ×4 IMPLANT
SPECIMEN JAR SMALL (MISCELLANEOUS) ×2 IMPLANT
SUT VIC AB 4-0 PS2 27 (SUTURE) ×2 IMPLANT
TOWEL OR 17X24 6PK STRL BLUE (TOWEL DISPOSABLE) ×2 IMPLANT
TOWEL OR 17X26 10 PK STRL BLUE (TOWEL DISPOSABLE) ×2 IMPLANT
TRAY LAPAROSCOPIC MC (CUSTOM PROCEDURE TRAY) ×2 IMPLANT
TROCAR XCEL BLUNT TIP 100MML (ENDOMECHANICALS) ×2 IMPLANT
TROCAR XCEL NON-BLD 5MMX100MML (ENDOMECHANICALS) ×2 IMPLANT
TUBING INSUFFLATION (TUBING) ×2 IMPLANT

## 2016-12-03 NOTE — Anesthesia Postprocedure Evaluation (Signed)
Anesthesia Post Note  Patient: Wayland Raffield  Procedure(s) Performed: LAPAROSCOPIC CHOLECYSTECTOMY (N/A Abdomen)     Patient location during evaluation: PACU Anesthesia Type: General Level of consciousness: awake, awake and alert and oriented Pain management: pain level controlled Vital Signs Assessment: post-procedure vital signs reviewed and stable Respiratory status: spontaneous breathing, nonlabored ventilation and respiratory function stable Cardiovascular status: blood pressure returned to baseline Anesthetic complications: no    Last Vitals:  Vitals:   12/03/16 1121 12/03/16 1136  BP: 128/79 125/84  Pulse: 95 100  Resp: (!) 24 (!) 24  Temp: (!) 36.1 C 36.5 C  SpO2: 95% 92%    Last Pain:  Vitals:   12/03/16 1136  TempSrc: Oral  PainSc:                  Aniela Caniglia COKER

## 2016-12-03 NOTE — Anesthesia Preprocedure Evaluation (Signed)
Anesthesia Evaluation  Patient identified by MRN, date of birth, ID band Patient awake    Reviewed: Allergy & Precautions, NPO status , Patient's Chart, lab work & pertinent test results  Airway Mallampati: II  TM Distance: >3 FB Neck ROM: Full    Dental  (+) Teeth Intact, Dental Advisory Given   Pulmonary    breath sounds clear to auscultation       Cardiovascular hypertension,  Rhythm:Regular Rate:Normal     Neuro/Psych    GI/Hepatic   Endo/Other    Renal/GU      Musculoskeletal   Abdominal   Peds  Hematology   Anesthesia Other Findings   Reproductive/Obstetrics                             Anesthesia Physical Anesthesia Plan  ASA: II  Anesthesia Plan: General   Post-op Pain Management:    Induction: Intravenous  PONV Risk Score and Plan: 1 and Ondansetron and Dexamethasone  Airway Management Planned: Oral ETT  Additional Equipment:   Intra-op Plan:   Post-operative Plan: Extubation in OR  Informed Consent: I have reviewed the patients History and Physical, chart, labs and discussed the procedure including the risks, benefits and alternatives for the proposed anesthesia with the patient or authorized representative who has indicated his/her understanding and acceptance.   Dental advisory given  Plan Discussed with: CRNA and Anesthesiologist  Anesthesia Plan Comments:         Anesthesia Quick Evaluation  

## 2016-12-03 NOTE — Progress Notes (Signed)
Day of Surgery   Subjective/Chief Complaint: Less RUQ pain   Objective: Vital signs in last 24 hours: Temp:  [97.7 F (36.5 C)-98.6 F (37 C)] 97.9 F (36.6 C) (10/27 0435) Pulse Rate:  [63-83] 63 (10/27 0435) Resp:  [19] 19 (10/27 0435) BP: (120-131)/(68-85) 120/76 (10/27 0435) SpO2:  [98 %-100 %] 100 % (10/27 0435) Weight:  [91.6 kg (202 lb)] 91.6 kg (202 lb) (10/26 2050) Last BM Date: 12/01/16  Intake/Output from previous day: 10/26 0701 - 10/27 0700 In: 1445 [P.O.:120; I.V.:1275; IV Piggyback:50] Out: 380 [Urine:350; Emesis/NG output:30] Intake/Output this shift: No intake/output data recorded.  General appearance: alert and cooperative Resp: clear to auscultation bilaterally Cardio: regular rate and rhythm GI: soft, tender ruq  Lab Results:   Recent Labs  12/02/16 1403  WBC 10.0  HGB 15.8  HCT 45.3  PLT 326   BMET  Recent Labs  12/02/16 1403  NA 137  K 4.3  CL 102  CO2 28  GLUCOSE 92  BUN 19  CREATININE 0.97  CALCIUM 8.8*   PT/INR No results for input(s): LABPROT, INR in the last 72 hours. ABG No results for input(s): PHART, HCO3 in the last 72 hours.  Invalid input(s): PCO2, PO2  Studies/Results: Nm Hepato W/eject Fract  Result Date: 12/01/2016 CLINICAL DATA:  Nausea, vomiting, intolerance to fatty foods EXAM: NUCLEAR MEDICINE HEPATOBILIARY IMAGING TECHNIQUE: Sequential images of the abdomen were obtained out to 60 minutes following intravenous administration of radiopharmaceutical. RADIOPHARMACEUTICALS:  5.1 mCi Tc-33m  Choletec IV COMPARISON:  None. FINDINGS: Prompt uptake and biliary excretion of activity by the liver is seen. The gallbladder, however, is not visualized. The nuclear medicine technologist discussed the case Dr. Chestine Spore who advised giving 3.7 mg of morphine after 90 minutes of imaging. After administration of the morphine, no activity is seen within the gallbladder. These findings are highly suspicious for cystic duct  obstruction. IMPRESSION: The gallbladder is not visualized even after administration of morphine, worrisome for cystic duct obstruction and probable developing acute cholecystitis. Electronically Signed   By: Dwyane Dee M.D.   On: 12/01/2016 14:31    Anti-infectives: Anti-infectives    Start     Dose/Rate Route Frequency Ordered Stop   12/02/16 1500  [MAR Hold]  cefTRIAXone (ROCEPHIN) 2 g in dextrose 5 % 50 mL IVPB     (MAR Hold since 12/03/16 0705)   2 g 100 mL/hr over 30 Minutes Intravenous Every 24 hours 12/02/16 1337        Assessment/Plan: Cholecystitis with cholelithiasis, possible choledocholithiasis - for laparoscopic cholecystectomy, IOC. Procedure, risks, and benefits as well as the expected outcome and post-op course were discussed and he agrees. Rocephin IV.  LOS: 0 days    Bobby Kelly E 12/03/2016

## 2016-12-03 NOTE — Op Note (Signed)
12/02/2016 - 12/03/2016  10:39 AM  PATIENT:  Bobby Kelly  36 y.o. male  PRE-OPERATIVE DIAGNOSIS:  Cholecystitis with cholelithiasis  POST-OPERATIVE DIAGNOSIS: Gangrenous cholecystitis with cholelithiasis   PROCEDURE:  Procedure(s): LAPAROSCOPIC CHOLECYSTECTOMY  SURGEON:  Surgeon(s): Violeta Gelinashompson, Savon Bordonaro, MD  ASSISTANTS: none   ANESTHESIA:   local and general  EBL:  Total I/O In: 1000 [I.V.:1000] Out: 40 [Blood:40]  BLOOD ADMINISTERED:none  DRAINS: (1) Jackson-Pratt drain(s) with closed bulb suction in the ruq   SPECIMEN:  Excision  DISPOSITION OF SPECIMEN:  PATHOLOGY  COUNTS:  YES  DICTATION: .Dragon Dictation Findings: Severe acute cholecystitis with gangrenous changes involving the entirety of the back wall, short cystic duct precluded cholangiogram  Procedure in detail: [name] presents for cholecystectomy.  He was identified in the preop holding area.  Informed consent was obtained.  He is receiving intravenous antibiotics and protocol.  He was brought to the operating room and general endotracheal anesthesia was administered by the anesthesia staff.  His abdomen was prepped and draped in sterile fashion.  We did a timeout procedure.The infraumbilical region was infiltrated with local. Infraumbilical incision was made. Subcutaneous tissues were dissected down revealing the anterior fascia. This was divided sharply along the midline. Peritoneal cavity was entered under direct vision without complication. A 0 Vicryl pursestring was placed around the fascial opening. Hassan trocar was inserted into the abdomen. The abdomen was insufflated with carbon dioxide in standard fashion. Under direct vision a 5 mm epigastric and 5 mm right abdominal port x2 were placed.  Local was used at each port site.  Exploration revealed a severely inflamed gallbladder completely covered with omentum which was also adherent to the anterior abdominal wall.  This was bluntly taken down and gradually and  gently removed exposing the dome of the gallbladder.  This was retracted superior medially.  Extensive gentle peeling of the omentum revealed the remainder of the gallbladder gradually down to the infundibulum.  There was severe inflammation with patchy gangrene.  Dissection began laterally.  The infundibulum was retracted inferior laterally.  Identified the cystic duct including a critical view of safety between the cystic duct, the liver, the gallbladder.  I was also able to see the cystic common duct junction as the cystic duct was only about 9 mm long.  The inflammation and short length precluded cholangiogram.  3 clips were placed proximally and one was placed distally and it was divided.  Next due to severe inflammation the dome down technique was required.  I began taking the dome off of the liver bed and encountered a completely gangrenous back wall.  Therefore the gallbladder was removed leaving the gangrenous back wall on the liver bed down to the infundibulum.  I found the cystic artery and clipped it twice proximally and once distally and divided it.  The remainder the gallbladder was removed with the addition of 2 very large stones using eco sac bag x2.  The gallbladder and stones were sent to pathology. The liver bed was cauterized liberally and excellent hemostasis was obtained.  A 19 [place] Blake drain was placed in the liver bed and brought out through the right lateral port site.  It was secured with nylon.  The area was further irrigated and irrigation fluid was evacuated.  Ports were then removed under direct vision.  Pneumoperitoneum was released.  Infra umbilical fascia was closed by tying the pursestring.  The port sites were closed with 4-0 Vicryl followed by Dermabond.  All counts were correct and he tolerated the  procedure without apparent complication.  He was taken to recovery in stable condition.  PATIENT DISPOSITION:  PACU - hemodynamically stable.   Delay start of Pharmacological  VTE agent (>24hrs) due to surgical blood loss or risk of bleeding:  yes  Violeta Gelinas, MD, MPH, FACS Pager: [phone]  10/27/201810:39 AM

## 2016-12-03 NOTE — Anesthesia Preprocedure Evaluation (Signed)

## 2016-12-03 NOTE — Transfer of Care (Signed)
Immediate Anesthesia Transfer of Care Note  Patient: Bobby Kelly  Procedure(s) Performed: LAPAROSCOPIC CHOLECYSTECTOMY (N/A Abdomen)  Patient Location: PACU  Anesthesia Type:General  Level of Consciousness: awake, alert , oriented and patient cooperative  Airway & Oxygen Therapy: Patient Spontanous Breathing and Patient connected to nasal cannula oxygen  Post-op Assessment: Report given to RN and Post -op Vital signs reviewed and stable  Post vital signs: Reviewed and stable  Last Vitals:  Vitals:   12/03/16 0435 12/03/16 1047  BP: 120/76 132/83  Pulse: 63 67  Resp: 19 (!) 23  Temp: 36.6 C (!) 36.1 C  SpO2: 100% 99%    Last Pain:  Vitals:   12/03/16 1047  TempSrc:   PainSc: (P) Asleep      Patients Stated Pain Goal: 2 (12/02/16 1400)  Complications: No apparent anesthesia complications

## 2016-12-03 NOTE — Progress Notes (Addendum)
Patient ambulated the hall from room to front desk and back along with family member twice this evening.  He tolerated walk very well and is currently back in bed.

## 2016-12-04 ENCOUNTER — Encounter (HOSPITAL_COMMUNITY): Payer: Self-pay | Admitting: General Surgery

## 2016-12-04 LAB — CBC
HEMATOCRIT: 31 % — AB (ref 39.0–52.0)
HEMOGLOBIN: 10 g/dL — AB (ref 13.0–17.0)
MCH: 30.4 pg (ref 26.0–34.0)
MCHC: 32.3 g/dL (ref 30.0–36.0)
MCV: 94.2 fL (ref 78.0–100.0)
Platelets: 332 10*3/uL (ref 150–400)
RBC: 3.29 MIL/uL — AB (ref 4.22–5.81)
RDW: 14.2 % (ref 11.5–15.5)
WBC: 20.5 10*3/uL — ABNORMAL HIGH (ref 4.0–10.5)

## 2016-12-04 LAB — COMPREHENSIVE METABOLIC PANEL
ALBUMIN: 2.8 g/dL — AB (ref 3.5–5.0)
ALK PHOS: 165 U/L — AB (ref 38–126)
ALT: 197 U/L — AB (ref 17–63)
AST: 49 U/L — AB (ref 15–41)
Anion gap: 7 (ref 5–15)
BILIRUBIN TOTAL: 1.3 mg/dL — AB (ref 0.3–1.2)
BUN: 31 mg/dL — ABNORMAL HIGH (ref 6–20)
CALCIUM: 7.4 mg/dL — AB (ref 8.9–10.3)
CO2: 21 mmol/L — ABNORMAL LOW (ref 22–32)
Chloride: 105 mmol/L (ref 101–111)
Creatinine, Ser: 1.42 mg/dL — ABNORMAL HIGH (ref 0.61–1.24)
GFR calc Af Amer: >60 mL/min (ref >60)
GFR calc non Af Amer: >60 mL/min (ref >60)
GLUCOSE: 160 mg/dL — AB (ref 65–99)
Potassium: 4.7 mmol/L (ref 3.5–5.1)
SODIUM: 133 mmol/L — AB (ref 135–145)
TOTAL PROTEIN: 6.2 g/dL — AB (ref 6.5–8.1)

## 2016-12-04 MED ORDER — LISINOPRIL 20 MG PO TABS
20.0000 mg | ORAL_TABLET | Freq: Every day | ORAL | Status: DC
  Administered 2016-12-04: 20 mg via ORAL
  Filled 2016-12-04: qty 1

## 2016-12-04 MED ORDER — METOPROLOL SUCCINATE ER 50 MG PO TB24
50.0000 mg | ORAL_TABLET | Freq: Every day | ORAL | Status: DC
  Administered 2016-12-04: 50 mg via ORAL
  Filled 2016-12-04: qty 1

## 2016-12-04 NOTE — Progress Notes (Addendum)
Nurse called to room.  Pt. Reports that he is in a lot of pain.  Patient assisted to bathroom.  Patient reports that top of his abdominal area is very sore and hurts really bad.  BP-118/80  HR-90.  Pt. Is burping a lot.  Dilaudid 1 mg given to patient as prescribed.  Pt. Reports that he feels much better after IV pain medication.  Wife at bedside.  Will continue to monitor.

## 2016-12-04 NOTE — Progress Notes (Signed)
Patient ID: Bobby Kelly, male   DOB: 1980/05/05, 36 y.o.   MRN: [id number] [facility] Surgery Progress Note:   1 Day Post-Op  Subjective: Mental status is clear but subdued.  He is in pain in a line on the left side away from his trocar sites.   Objective: Vital signs in last 24 hours: Temp:  [97 F (36.1 C)-98.7 F (37.1 C)] 98.7 F (37.1 C) (10/28 0624) Pulse Rate:  [67-120] 120 (10/28 0624) Resp:  [19-26] 20 (10/28 0014) BP: (95-132)/(50-86) 130/86 (10/28 0624) SpO2:  [92 %-99 %] 97 % (10/28 0624)  Intake/Output from previous day: 10/27 0701 - 10/28 0700 In: 2016.7 [I.V.:1966.7; IV Piggyback:50] Out: 1675 [Urine:575; Drains:1040; Blood:60] Intake/Output this shift: No intake/output data recorded.  Physical Exam: Work of breathing is increased and shallow but not labored.  Tender in the left abdomen along the rectus line.  Incisions covered.  JP bloody.  Resting tachycardia  Lab Results:  Results for orders placed or performed during the hospital encounter of 12/02/16 (from the past 48 hour(s))  CBC     Status: None   Collection Time: 12/02/16  2:03 PM  Result Value Ref Range   WBC 10.0 4.0 - 10.5 K/uL   RBC 4.90 4.22 - 5.81 MIL/uL   Hemoglobin 15.8 13.0 - 17.0 g/dL   HCT 45.3 39.0 - 52.0 %   MCV 92.4 78.0 - 100.0 fL   MCH 32.2 26.0 - 34.0 pg   MCHC 34.9 30.0 - 36.0 g/dL   RDW 14.5 11.5 - 15.5 %   Platelets 326 150 - 400 K/uL  Comprehensive metabolic panel     Status: Abnormal   Collection Time: 12/02/16  2:03 PM  Result Value Ref Range   Sodium 137 135 - 145 mmol/L   Potassium 4.3 3.5 - 5.1 mmol/L   Chloride 102 101 - 111 mmol/L   CO2 28 22 - 32 mmol/L   Glucose, Bld 92 65 - 99 mg/dL   BUN 19 6 - 20 mg/dL   Creatinine, Ser 0.97 0.61 - 1.24 mg/dL   Calcium 8.8 (L) 8.9 - 10.3 mg/dL   Total Protein 7.4 6.5 - 8.1 g/dL   Albumin 3.3 (L) 3.5 - 5.0 g/dL   AST 196 (H) 15 - 41 U/L   ALT 432 (H) 17 - 63 U/L   Alkaline Phosphatase 324 (H) 38 - 126 U/L   Total  Bilirubin 2.0 (H) 0.3 - 1.2 mg/dL   GFR calc non Af Amer >60 >60 mL/min   GFR calc Af Amer >60 >60 mL/min    Comment: (NOTE) The eGFR has been calculated using the CKD EPI equation. This calculation has not been validated in all clinical situations. eGFR's persistently <60 mL/min signify possible Chronic Kidney Disease.    Anion gap 7 5 - 15  Surgical pcr screen     Status: Abnormal   Collection Time: 12/02/16  7:32 PM  Result Value Ref Range   MRSA, PCR NEGATIVE NEGATIVE   Staphylococcus aureus POSITIVE (A) NEGATIVE    Comment: (NOTE) The Xpert SA Assay (FDA approved for NASAL specimens in patients 44 years of age and older), is one component of a comprehensive surveillance program. It is not intended to diagnose infection nor to guide or monitor treatment.     Radiology/Results: No results found.  Anti-infectives: Anti-infectives    Start     Dose/Rate Route Frequency Ordered Stop   12/02/16 1500  cefTRIAXone (ROCEPHIN) 2 g in dextrose 5 % 50  mL IVPB     2 g 100 mL/hr over 30 Minutes Intravenous Every 24 hours 12/02/16 1337        Assessment/Plan: Problem List: Patient Active Problem List   Diagnosis Date Noted  . Acute calculous cholecystitis 12/02/2016  . Routine general medical examination at a health care facility 11/03/2016  . Need for influenza vaccination 11/03/2016  . Class 2 obesity with serious comorbidity and body mass index (BMI) of 36.0 to 36.9 in adult 11/03/2016  . Vaccine counseling 11/03/2016  . Heart disease 11/03/2016  . Palpitation 11/03/2016  . Constipation 04/28/2016    Stat labs ordered today-will restart antihypertensives (metoprolol and lisinopril).   1 Day Post-Op    LOS: 1 day   Matt B. Hassell Done, MD, [facility] [phone] beeper [phone]  12/04/2016 8:47 AM

## 2016-12-05 MED ORDER — DEXTROSE IN LACTATED RINGERS 5 % IV SOLN
INTRAVENOUS | Status: DC
  Administered 2016-12-05 – 2016-12-06 (×2): via INTRAVENOUS
  Administered 2016-12-06: 1 mL via INTRAVENOUS
  Administered 2016-12-07 (×2): via INTRAVENOUS

## 2016-12-05 MED ORDER — METOPROLOL SUCCINATE ER 25 MG PO TB24
12.5000 mg | ORAL_TABLET | Freq: Every day | ORAL | Status: DC
  Administered 2016-12-06: 12.5 mg via ORAL
  Filled 2016-12-05: qty 1

## 2016-12-05 MED ORDER — CODEINE SULFATE 15 MG PO TABS
30.0000 mg | ORAL_TABLET | ORAL | Status: DC | PRN
  Administered 2016-12-05: 30 mg via ORAL
  Administered 2016-12-05 – 2016-12-06 (×3): 60 mg via ORAL
  Administered 2016-12-06 – 2016-12-10 (×11): 30 mg via ORAL
  Filled 2016-12-05 (×3): qty 2
  Filled 2016-12-05: qty 4
  Filled 2016-12-05 (×9): qty 2
  Filled 2016-12-05: qty 4
  Filled 2016-12-05 (×2): qty 2

## 2016-12-05 MED ORDER — ACETAMINOPHEN 500 MG PO TABS
1000.0000 mg | ORAL_TABLET | Freq: Three times a day (TID) | ORAL | Status: DC
  Administered 2016-12-05 – 2016-12-25 (×49): 1000 mg via ORAL
  Filled 2016-12-05 (×54): qty 2

## 2016-12-05 MED ORDER — SODIUM CHLORIDE 0.9 % IV BOLUS (SEPSIS)
1000.0000 mL | Freq: Once | INTRAVENOUS | Status: AC
  Administered 2016-12-05: 1000 mL via INTRAVENOUS

## 2016-12-05 NOTE — Progress Notes (Signed)
2 Days Post-Op    CC: Abdominal pain  Subjective: Patient actually looks pretty good this a.m. he was able to eat some pancakes, pain seems a little bit better.  JP drain is old bloody drainage.  Patient notes he was throwing away his urine so the actual urine output is probably better than what has been recorded.  Objective: Vital signs in last 24 hours: Temp:  [98.3 F (36.8 C)-99.6 F (37.6 C)] 99.6 F (37.6 C) (10/29 0413) Pulse Rate:  [110-123] 113 (10/29 0413) Resp:  [17-20] 17 (10/29 0413) BP: (99-131)/(51-86) 99/51 (10/29 0413) SpO2:  [93 %-97 %] 93 % (10/29 0413) Last BM Date: 12/01/16 710 PO 5026 IV 900 urine recorded Drain:  280 Afebrile, BP lower end or normal, Sats OK on RA Creatinine is up Bilirubin/LFT's improving WBC 20.5K   NA 133 Creatinine is up to 1.42 LFT's better WBC up to 20.5 K Intake/Output from previous day: 10/28 0701 - 10/29 0700 In: 5786.6 [P.O.:710; I.V.:5026.6; IV Piggyback:50] Out: 1180 [Urine:900; Drains:280] Intake/Output this shift: No intake/output data recorded.  General appearance: alert, cooperative and no distress Resp: clear to auscultation bilaterally GI: Soft, very sore, port sites look good, drainage is bloody sanguinous drainage.  Lab Results:   Recent Labs  12/02/16 1403 12/04/16 0942  WBC 10.0 20.5*  HGB 15.8 10.0*  HCT 45.3 31.0*  PLT 326 332    BMET  Recent Labs  12/02/16 1403 12/04/16 0942  NA 137 133*  K 4.3 4.7  CL 102 105  CO2 28 21*  GLUCOSE 92 160*  BUN 19 31*  CREATININE 0.97 1.42*  CALCIUM 8.8* 7.4*   PT/INR No results for input(s): LABPROT, INR in the last 72 hours.   Recent Labs Lab 12/02/16 1403 12/04/16 0942  AST 196* 49*  ALT 432* 197*  ALKPHOS 324* 165*  BILITOT 2.0* 1.3*  PROT 7.4 6.2*  ALBUMIN 3.3* 2.8*     Lipase     Component Value Date/Time   LIPASE 21 11/25/2016 2301     Prior to Admission medications   Medication Sig Start Date End Date Taking? Authorizing  Provider  lisinopril (PRINIVIL,ZESTRIL) 20 MG tablet Take 1 tablet (20 mg total) by mouth daily. 10/17/16 01/15/17 Yes Camnitz, Will Daphine Deutscher, MD  metoprolol succinate (TOPROL-XL) 50 MG 24 hr tablet Take 1 tablet (50 mg total) by mouth daily. Take with or immediately following a meal. 10/17/16  Yes Camnitz, Will Daphine Deutscher, MD  ondansetron (ZOFRAN ODT) 4 MG disintegrating tablet Take 1 tablet (4 mg total) by mouth every 8 (eight) hours as needed for nausea or vomiting. 11/26/16  Yes Khatri, Hina, PA-C  oxyCODONE-acetaminophen (PERCOCET/ROXICET) 5-325 MG tablet Take 1 tablet by mouth every 8 (eight) hours as needed for severe pain. 11/26/16  Yes Khatri, Hina, PA-C  pravastatin (PRAVACHOL) 20 MG tablet Take 1 tablet (20 mg total) by mouth every evening. 11/07/16 11/07/17 Yes Tysinger, Kermit Balo, PA-C    Medications: . Chlorhexidine Gluconate Cloth  6 each Topical Daily  . lisinopril  20 mg Oral Daily  . metoprolol succinate  50 mg Oral Q breakfast  . mupirocin ointment  1 application Nasal BID   . 0.9 % NaCl with KCl 20 mEq / L 125 mL/hr at 12/05/16 0654  . cefTRIAXone (ROCEPHIN)  IV Stopped (12/04/16 1438)  . lactated ringers 10 mL/hr at 12/03/16 8250   Anti-infectives    Start     Dose/Rate Route Frequency Ordered Stop   12/02/16 1500  cefTRIAXone (ROCEPHIN) 2  g in dextrose 5 % 50 mL IVPB     2 g 100 mL/hr over 30 Minutes Intravenous Every 24 hours 12/02/16 1337        Assessment/Plan Gangrenous cholecystitis with cholelithiasis  Laparoscopic cholecystectomy, 19 French Blake drain placement, gangrenous changes involving the entirety of the back wall, short cystic duct precluded cholangiogram10/27/18, Dr. Violeta GelinasBurke Thompson  Acute kidney injury:  Creatinine 1.07/0.97/1.42 today Hold lisinopril/decrease Toprol/ increase fluids  Leukocytosis - continue antibiotics  Active problems: Hypertension - Toprol/lisiniopril restarted yesterday - stopping lisinopril/ decreasing Toprol for now   Borderline CHF  - Grade 2 diastolic dysfunction/EF 44% Hyperlipidemia History of thyroid disease FEN: IV fluids/soft diet ID: Rocephin 10/26 =>> day 4 DVT:  SCD's Foley: none Follow up:  DOW  Plan: Increase fluids, continue antibiotics, hold lisinopril and decrease Toprol.  Recheck labs in a.m. he would like to avoid narcotics, especially the Codone preparations.  I will try him on around-the-clock Tylenol p.o. and codeine.   LOS: 2 days    Malcom Selmer 12/05/2016 [phone]

## 2016-12-05 NOTE — Progress Notes (Signed)
Rounding PA and MD paged about patient's elevated temp. Patient states he feels cold with chills. Awaiting response.

## 2016-12-06 LAB — CBC
HEMATOCRIT: 23.2 % — AB (ref 39.0–52.0)
HEMATOCRIT: 24.2 % — AB (ref 39.0–52.0)
HEMOGLOBIN: 7.8 g/dL — AB (ref 13.0–17.0)
Hemoglobin: 7.6 g/dL — ABNORMAL LOW (ref 13.0–17.0)
MCH: 31 pg (ref 26.0–34.0)
MCH: 30.6 pg (ref 26.0–34.0)
MCHC: 32.8 g/dL (ref 30.0–36.0)
MCHC: 32.2 g/dL (ref 30.0–36.0)
MCV: 94.7 fL (ref 78.0–100.0)
MCV: 94.9 fL (ref 78.0–100.0)
PLATELETS: 296 10*3/uL (ref 150–400)
Platelets: 284 10*3/uL (ref 150–400)
RBC: 2.45 MIL/uL — ABNORMAL LOW (ref 4.22–5.81)
RBC: 2.55 MIL/uL — ABNORMAL LOW (ref 4.22–5.81)
RDW: 14.2 % (ref 11.5–15.5)
RDW: 14.1 % (ref 11.5–15.5)
WBC: 16.5 10*3/uL — ABNORMAL HIGH (ref 4.0–10.5)
WBC: 18.1 10*3/uL — ABNORMAL HIGH (ref 4.0–10.5)

## 2016-12-06 LAB — COMPREHENSIVE METABOLIC PANEL
ALBUMIN: 2.3 g/dL — AB (ref 3.5–5.0)
ALT: 89 U/L — ABNORMAL HIGH (ref 17–63)
ANION GAP: 5 (ref 5–15)
AST: 24 U/L (ref 15–41)
Alkaline Phosphatase: 117 U/L (ref 38–126)
BILIRUBIN TOTAL: 1 mg/dL (ref 0.3–1.2)
BUN: 7 mg/dL (ref 6–20)
CO2: 26 mmol/L (ref 22–32)
Calcium: 7.3 mg/dL — ABNORMAL LOW (ref 8.9–10.3)
Chloride: 102 mmol/L (ref 101–111)
Creatinine, Ser: 0.83 mg/dL (ref 0.61–1.24)
GFR calc Af Amer: >60 mL/min (ref >60)
GFR calc non Af Amer: >60 mL/min (ref >60)
Glucose, Bld: 122 mg/dL — ABNORMAL HIGH (ref 65–99)
POTASSIUM: 4 mmol/L (ref 3.5–5.1)
SODIUM: 133 mmol/L — AB (ref 135–145)
TOTAL PROTEIN: 5.2 g/dL — AB (ref 6.5–8.1)

## 2016-12-06 LAB — TYPE AND SCREEN
ABO/RH(D): O POS
Antibody Screen: NEGATIVE

## 2016-12-06 LAB — ABO/RH: ABO/RH(D): O POS

## 2016-12-06 MED ORDER — METOPROLOL SUCCINATE ER 25 MG PO TB24
25.0000 mg | ORAL_TABLET | Freq: Every day | ORAL | Status: DC
  Administered 2016-12-07 – 2016-12-25 (×16): 25 mg via ORAL
  Filled 2016-12-06 (×18): qty 1

## 2016-12-06 NOTE — Progress Notes (Signed)
3 Days Post-Op    CC:  Abdominal pain  Subjective: He looks pretty good.  He is very sore, wife is helping him get up and down in bed.  Still has bloody drainage in the JP and H/H is down this AM. Bloody drainage in the JP, I drained most of what is in there now and it is bloody but not clotting.  They are also still upset about the ED sending them home last week.   Objective: Vital signs in last 24 hours: Temp:  [98.4 F (36.9 C)-100.3 F (37.9 C)] 99.5 F (37.5 C) (10/30 0425) Pulse Rate:  [108-119] 115 (10/30 0425) Resp:  [17-18] 18 (10/30 0425) BP: (103-121)/(56-65) 110/56 (10/30 0425) SpO2:  [95 %-96 %] 95 % (10/30 0425) Last BM Date: 12/01/16 Po not recorded 3800 IV 320 drain Afebrile, Tachycardic Labs improving =>> creatinine down to normal, WBC improving H/H is dow to 7.6/23 - will recheck at noon   Intake/Output from previous day: 10/29 0701 - 10/30 0700 In: 3798.9 [I.V.:2748.9; IV Piggyback:1050] Out: 1495 [Urine:1175; Drains:320] Intake/Output this shift: No intake/output data recorded.  General appearance: alert, cooperative and no distress Resp: clear with a few rales in the base.   GI: sites look good.  Bloody drainage from the JP, still very sore.  Lab Results:   Recent Labs  12/04/16 0942 12/06/16 0541  WBC 20.5* 16.5*  HGB 10.0* 7.6*  HCT 31.0* 23.2*  PLT 332 296    BMET  Recent Labs  12/04/16 0942 12/06/16 0541  NA 133* 133*  K 4.7 4.0  CL 105 102  CO2 21* 26  GLUCOSE 160* 122*  BUN 31* 7  CREATININE 1.42* 0.83  CALCIUM 7.4* 7.3*   PT/INR No results for input(s): LABPROT, INR in the last 72 hours.   Recent Labs Lab 12/02/16 1403 12/04/16 0942 12/06/16 0541  AST 196* 49* 24  ALT 432* 197* 89*  ALKPHOS 324* 165* 117  BILITOT 2.0* 1.3* 1.0  PROT 7.4 6.2* 5.2*  ALBUMIN 3.3* 2.8* 2.3*     Lipase     Component Value Date/Time   LIPASE 21 11/25/2016 2301     Medications: . acetaminophen  1,000 mg Oral Q8H  .  Chlorhexidine Gluconate Cloth  6 each Topical Daily  . metoprolol succinate  12.5 mg Oral Q breakfast  . mupirocin ointment  1 application Nasal BID   Anti-infectives    Start     Dose/Rate Route Frequency Ordered Stop   12/02/16 1500  cefTRIAXone (ROCEPHIN) 2 g in dextrose 5 % 50 mL IVPB     2 g 100 mL/hr over 30 Minutes Intravenous Every 24 hours 12/02/16 1337        Assessment/Plan Gangrenous cholecystitis with cholelithiasis  Laparoscopic cholecystectomy, 19 French Blake drain placement, gangrenous changes involving the entirety of the back wall, short cystic duct precluded cholangiogram10/27/18, Dr. Violeta GelinasBurke Thompson  Acute kidney injury:  Creatinine 1.07/0.97/1.42 12/04/16  =>> 0.83 today Hold lisinopril/increase Toprol, and decease fluids some.   Leukocytosis - Improving  Active problems: Hypertension - Toprol/lisiniopril restarted yesterday - stopping lisinopril/ decreasing Toprol for now   Borderline CHF - Grade 2 diastolic dysfunction/EF 44% Hyperlipidemia History of thyroid disease FEN: IV fluids/soft diet ID: Rocephin 10/26 =>> day 5 DVT:  SCD's/ no anticoagulation with drop in H/H Foley: none Follow up:  DOW  Plan:  Concerned with drop in H/H. Will recheck.  He looks pretty good, but BP is still not high enough to put back on  full dose Toprol and lisinopril his pre op med.  Repeating CBC at noon.  Type and screen at noon also.   LOS: 3 days    Bobby Kelly 12/06/2016 [phone]

## 2016-12-07 ENCOUNTER — Encounter (HOSPITAL_COMMUNITY): Payer: Self-pay | Admitting: Radiology

## 2016-12-07 ENCOUNTER — Inpatient Hospital Stay (HOSPITAL_COMMUNITY): Payer: No Typology Code available for payment source

## 2016-12-07 LAB — COMPREHENSIVE METABOLIC PANEL
ALK PHOS: 134 U/L — AB (ref 38–126)
ALT: 71 U/L — AB (ref 17–63)
AST: 22 U/L (ref 15–41)
Albumin: 2.4 g/dL — ABNORMAL LOW (ref 3.5–5.0)
Anion gap: 8 (ref 5–15)
BILIRUBIN TOTAL: 1.1 mg/dL (ref 0.3–1.2)
BUN: 7 mg/dL (ref 6–20)
CALCIUM: 7.8 mg/dL — AB (ref 8.9–10.3)
CO2: 25 mmol/L (ref 22–32)
CREATININE: 0.83 mg/dL (ref 0.61–1.24)
Chloride: 100 mmol/L — ABNORMAL LOW (ref 101–111)
GFR calc Af Amer: >60 mL/min (ref >60)
Glucose, Bld: 108 mg/dL — ABNORMAL HIGH (ref 65–99)
Potassium: 3.7 mmol/L (ref 3.5–5.1)
Sodium: 133 mmol/L — ABNORMAL LOW (ref 135–145)
TOTAL PROTEIN: 5.7 g/dL — AB (ref 6.5–8.1)

## 2016-12-07 LAB — CBC
HEMATOCRIT: 25.9 % — AB (ref 39.0–52.0)
Hemoglobin: 8.4 g/dL — ABNORMAL LOW (ref 13.0–17.0)
MCH: 30.8 pg (ref 26.0–34.0)
MCHC: 32.4 g/dL (ref 30.0–36.0)
MCV: 94.9 fL (ref 78.0–100.0)
Platelets: 402 10*3/uL — ABNORMAL HIGH (ref 150–400)
RBC: 2.73 MIL/uL — ABNORMAL LOW (ref 4.22–5.81)
RDW: 14.7 % (ref 11.5–15.5)
WBC: 20.4 10*3/uL — ABNORMAL HIGH (ref 4.0–10.5)

## 2016-12-07 MED ORDER — PANTOPRAZOLE SODIUM 40 MG PO TBEC
40.0000 mg | DELAYED_RELEASE_TABLET | Freq: Every day | ORAL | Status: DC
  Administered 2016-12-07 – 2016-12-25 (×17): 40 mg via ORAL
  Filled 2016-12-07 (×18): qty 1

## 2016-12-07 MED ORDER — PIPERACILLIN-TAZOBACTAM 3.375 G IVPB
3.3750 g | Freq: Three times a day (TID) | INTRAVENOUS | Status: AC
  Administered 2016-12-07 – 2016-12-20 (×41): 3.375 g via INTRAVENOUS
  Filled 2016-12-07 (×42): qty 50

## 2016-12-07 MED ORDER — IOPAMIDOL (ISOVUE-300) INJECTION 61%
INTRAVENOUS | Status: AC
  Administered 2016-12-07: 100 mL
  Filled 2016-12-07: qty 100

## 2016-12-07 MED ORDER — IOPAMIDOL (ISOVUE-300) INJECTION 61%
INTRAVENOUS | Status: AC
  Filled 2016-12-07: qty 30

## 2016-12-07 MED ORDER — PANTOPRAZOLE SODIUM 40 MG PO PACK: 40.0000 mg | PACK | Freq: Every day | ORAL | Status: DC

## 2016-12-07 NOTE — Progress Notes (Signed)
1345 Pt is c/o increasing abd pain and bloating. Abd is more distended, soft but tender. Will Marlyne Beards came in and assessed the pt. Will do CT abd today.

## 2016-12-07 NOTE — Progress Notes (Signed)
4 Days Post-Op   Subjective/Chief Complaint: Pt with some pain in the epigastrum overnight. Some chills Tol PO   Objective: Vital signs in last 24 hours: Temp:  [99 F (37.2 C)-99.6 F (37.6 C)] 99 F (37.2 C) (10/31 0444) Pulse Rate:  [108-117] 108 (10/31 0444) Resp:  [19-20] 19 (10/31 0444) BP: (111-122)/(61-75) 111/69 (10/31 0444) SpO2:  [95 %-97 %] 95 % (10/31 0444) Last BM Date: 12/06/16  Intake/Output from previous day: 10/30 0701 - 10/31 0700 In: 2747.5 [P.O.:720; I.V.:1977.5; IV Piggyback:50] Out: 2105 [Urine:1775; Drains:330] Intake/Output this shift: No intake/output data recorded.  General appearance: alert and cooperative GI: soft, non-tender; bowel sounds normal; no masses,  no organomegaly and incision c/d/o  Lab Results:   Recent Labs  12/06/16 1149 12/07/16 0611  WBC 18.1* 20.4*  HGB 7.8* 8.4*  HCT 24.2* 25.9*  PLT 284 402*   BMET  Recent Labs  12/06/16 0541 12/07/16 0611  NA 133* 133*  K 4.0 3.7  CL 102 100*  CO2 26 25  GLUCOSE 122* 108*  BUN 7 7  CREATININE 0.83 0.83  CALCIUM 7.3* 7.8*   Anti-infectives: Anti-infectives    Start     Dose/Rate Route Frequency Ordered Stop   12/07/16 0830  piperacillin-tazobactam (ZOSYN) IVPB 3.375 g     3.375 g 12.5 mL/hr over 240 Minutes Intravenous Every 8 hours 12/07/16 0736     12/02/16 1500  cefTRIAXone (ROCEPHIN) 2 g in dextrose 5 % 50 mL IVPB  Status:  Discontinued     2 g 100 mL/hr over 30 Minutes Intravenous Every 24 hours 12/02/16 1337 12/07/16 0736      Assessment/Plan: Gangrenous cholecystitis with cholelithiasis  Laparoscopic cholecystectomy, 19 French Blake drain placement, gangrenous changes involving the entirety of the back wall, short cystic duct precluded cholangiogram10/27/18, Dr. Violeta Gelinas  Acute kidney injury: Creatinine 1.07/0.97/1.42 12/04/16  =>> 0.83 today Hold lisinopril/increase Toprol, and decease fluids some.    Leukocytosis - Trending up  Active  problems: Hypertension - Toprol/lisiniopril restarted yesterday - stopping lisinopril/ decreasing Toprol for now  Borderline CHF - Grade 2 diastolic dysfunction/EF 44% Hyperlipidemia History of thyroid disease FEN: IV fluids/soft diet ID: Rocephin 10/26 =>> day 5 DVT: SCD's/ no anticoagulation with drop in H/H Foley: none Follow up: DOW  Plan:  HOlding anticoag and HCt stable for now Will switch to Zosyn.  Repeat CBC in AM.  Pt may benefit from CT scan tomororw to r/o abscess  LOS: 4 days    Marigene Ehlers., [facility] 12/07/2016

## 2016-12-08 ENCOUNTER — Inpatient Hospital Stay (HOSPITAL_COMMUNITY): Payer: No Typology Code available for payment source

## 2016-12-08 ENCOUNTER — Telehealth: Payer: Self-pay | Admitting: Family Medicine

## 2016-12-08 LAB — BASIC METABOLIC PANEL
Anion gap: 8 (ref 5–15)
BUN: 9 mg/dL (ref 6–20)
CHLORIDE: 100 mmol/L — AB (ref 101–111)
CO2: 24 mmol/L (ref 22–32)
Calcium: 8 mg/dL — ABNORMAL LOW (ref 8.9–10.3)
Creatinine, Ser: 0.88 mg/dL (ref 0.61–1.24)
GFR calc Af Amer: >60 mL/min (ref >60)
GFR calc non Af Amer: >60 mL/min (ref >60)
GLUCOSE: 103 mg/dL — AB (ref 65–99)
POTASSIUM: 4.4 mmol/L (ref 3.5–5.1)
SODIUM: 132 mmol/L — AB (ref 135–145)

## 2016-12-08 LAB — CBC
HCT: 26.8 % — ABNORMAL LOW (ref 39.0–52.0)
HEMOGLOBIN: 8.5 g/dL — AB (ref 13.0–17.0)
MCH: 29.8 pg (ref 26.0–34.0)
MCHC: 31.7 g/dL (ref 30.0–36.0)
MCV: 94 fL (ref 78.0–100.0)
Platelets: 482 10*3/uL — ABNORMAL HIGH (ref 150–400)
RBC: 2.85 MIL/uL — AB (ref 4.22–5.81)
RDW: 14.3 % (ref 11.5–15.5)
WBC: 18.5 10*3/uL — ABNORMAL HIGH (ref 4.0–10.5)

## 2016-12-08 LAB — PROTIME-INR
INR: 1.08
PROTHROMBIN TIME: 13.9 s (ref 11.4–15.2)

## 2016-12-08 MED ORDER — KCL IN DEXTROSE-NACL 20-5-0.9 MEQ/L-%-% IV SOLN
INTRAVENOUS | Status: DC
  Administered 2016-12-08 – 2016-12-10 (×5): via INTRAVENOUS
  Filled 2016-12-08 (×6): qty 1000

## 2016-12-08 MED ORDER — FENTANYL CITRATE (PF) 100 MCG/2ML IJ SOLN
INTRAMUSCULAR | Status: AC
  Filled 2016-12-08: qty 4

## 2016-12-08 MED ORDER — MIDAZOLAM HCL 2 MG/2ML IJ SOLN
INTRAMUSCULAR | Status: AC | PRN
  Administered 2016-12-08: 1 mg via INTRAVENOUS

## 2016-12-08 MED ORDER — SODIUM CHLORIDE 0.9 % IV SOLN
INTRAVENOUS | Status: AC | PRN
  Administered 2016-12-08: 10 mL/h via INTRAVENOUS

## 2016-12-08 MED ORDER — FENTANYL CITRATE (PF) 100 MCG/2ML IJ SOLN
INTRAMUSCULAR | Status: AC | PRN
  Administered 2016-12-08: 50 ug via INTRAVENOUS

## 2016-12-08 MED ORDER — MIDAZOLAM HCL 2 MG/2ML IJ SOLN
INTRAMUSCULAR | Status: AC
  Filled 2016-12-08: qty 4

## 2016-12-08 MED ORDER — LIDOCAINE HCL 1 % IJ SOLN
INTRAMUSCULAR | Status: AC
  Filled 2016-12-08: qty 20

## 2016-12-08 NOTE — Sedation Documentation (Signed)
Patient is resting comfortably. 

## 2016-12-08 NOTE — Consult Note (Signed)
Chief Complaint: Patient was seen in consultation today for intra-abdominal fluid collection  Referring Physician(s): Dr. Axel Filler  Supervising Physician: Jolaine Click  Patient Status: Bobby Kelly - In-pt  History of Present Illness: Bobby Kelly is a 36 y.o. male with past medical history of HTN and gallstones who underwent laprascopic cholestectomy for symptomatic gallstones 12/03/16. A JP was left in place with continued bloody output. Patient developed worsening abdominal pain 12/07/16 and repeat CT abdomen was ordered.   CT Abdomen 12/07/16 showed: 1. Recent cholecystectomy with thick walled, loculated, complex fluid collection in the right upper quadrant and gallbladder fossa, exerting mass effect on the liver. The superior, more low-density component measuring up to 19.5 cm is concerning for abscess, with biloma also in the differential. The inferior, more hyperdense component along the anterior abdominal wall measuring up to 17.4 cm is suspicious for hematoma. 2. Small right pleural effusion. Bilateral lower lobe subsegmental Atelectasis.  IR consulted for aspiration and drainage of intra-abdominal fluid collection.  Case reviewed by Dr. Bonnielee Haff who approves patient for procedure.   Past Medical History:  Diagnosis Date  . Allergy   . Dry skin    nasal folds  . Hypertension   . Thyroid disease    hyperactive in high school, had oral therapy.    . Wears contact lenses     Past Surgical History:  Procedure Laterality Date  . APPENDECTOMY    . CHOLECYSTECTOMY N/A 12/03/2016   Procedure: LAPAROSCOPIC CHOLECYSTECTOMY;  Surgeon: Violeta Gelinas, MD;  Location: [facility] OR;  Service: General;  Laterality: N/A;    Allergies: Patient has no known allergies.  Medications: Prior to Admission medications   Medication Sig Start Date End Date Taking? Authorizing Provider  lisinopril (PRINIVIL,ZESTRIL) 20 MG tablet Take 1 tablet (20 mg total) by mouth daily. 10/17/16 01/15/17  Yes Camnitz, Will Daphine Deutscher, MD  metoprolol succinate (TOPROL-XL) 50 MG 24 hr tablet Take 1 tablet (50 mg total) by mouth daily. Take with or immediately following a meal. 10/17/16  Yes Camnitz, Will Daphine Deutscher, MD  ondansetron (ZOFRAN ODT) 4 MG disintegrating tablet Take 1 tablet (4 mg total) by mouth every 8 (eight) hours as needed for nausea or vomiting. 11/26/16  Yes Khatri, Hina, PA-C  oxyCODONE-acetaminophen (PERCOCET/ROXICET) 5-325 MG tablet Take 1 tablet by mouth every 8 (eight) hours as needed for severe pain. 11/26/16  Yes Khatri, Hina, PA-C  pravastatin (PRAVACHOL) 20 MG tablet Take 1 tablet (20 mg total) by mouth every evening. 11/07/16 11/07/17 Yes Tysinger, Kermit Balo, PA-C     Family History  Problem Relation Age of Onset  . Hypertension Mother   . Cataracts Mother   . Hyperlipidemia Father   . Pancreatic cancer Maternal Grandmother   . Cancer Maternal Grandmother   . Diabetes Other   . Heart disease Neg Hx   . Stroke Neg Hx     Social History   Social History  . Marital status: Married    Spouse name: N/A  . Number of children: N/A  . Years of education: N/A   Social History Main Topics  . Smoking status: Never Smoker  . Smokeless tobacco: Never Used  . Alcohol use 2.4 oz/week    3 Cans of beer, 1 Shots of liquor per week     Comment: occ, history of 2-3 years of heavier alcohol  . Drug use: No  . Sexual activity: Not Asked   Other Topics Concern  . None   Social History Narrative   Lives at home with  wife and 1yo son. Son's birthday end of September.   Exercise - some jogging 2-3 times per week.    Considering joining gym.   10/2016    Review of Systems  Constitutional: Negative for fatigue and fever.  Respiratory: Negative for cough and shortness of breath.   Cardiovascular: Negative for chest pain.  Gastrointestinal: Positive for abdominal pain.  Psychiatric/Behavioral: Negative for behavioral problems and confusion.    Vital Signs: BP 116/71 (BP Location:  Left Arm)   Pulse (!) 114   Temp 99.3 F (37.4 C) (Oral)   Resp 18   Ht 5\' 5"  (1.651 m)   Wt 202 lb (91.6 kg)   SpO2 95%   BMI 33.61 kg/m   Physical Exam  Constitutional: He is oriented to person, place, and time. He appears well-developed.  Cardiovascular: Normal rate, regular rhythm and normal heart sounds.   Pulmonary/Chest: Effort normal and breath sounds normal. No respiratory distress.  Abdominal: Soft. There is tenderness.  JP drain in place in right lateral abdomen.  Neurological: He is alert and oriented to person, place, and time.  Skin: Skin is warm and dry.  Psychiatric: He has a normal mood and affect. His behavior is normal. Judgment and thought content normal.  Nursing note and vitals reviewed.   Imaging: Ct Abdomen Pelvis W Contrast  Result Date: 12/07/2016 CLINICAL DATA:  Abdominal pain. Recent cholecystectomy on 12/03/2016. EXAM: CT ABDOMEN AND PELVIS WITH CONTRAST TECHNIQUE: Multidetector CT imaging of the abdomen and pelvis was performed using the standard protocol following bolus administration of intravenous contrast. CONTRAST:  < 100 cc> ISOVUE-300 IOPAMIDOL (ISOVUE-300) INJECTION 61% COMPARISON:  Abdominal ultrasound dated November 26, 2016. FINDINGS: Lower chest: Small right pleural effusion. Bilateral lower lobe subsegmental atelectasis. Hepatobiliary: No focal liver abnormality. The gallbladder is surgically absent. No biliary dilatation. There is a large, loculated, thick-walled fluid collection in the right upper quadrant which exerts mass effect on the liver. The collection extends into the gallbladder fossa, and along the anterior abdominal wall, where it is more hyperdense. The more superior, lower density component of the fluid collection measures approximately 19.5 x 8.3 x 13.1 cm (AP by transverse by CC), while the more hyperdense, inferior component along the anterior abdominal wall measures approximately 6.4 x 17.4 x 8.3 cm (AP by transverse by CC).  Pancreas: Unremarkable. No pancreatic ductal dilatation or surrounding inflammatory changes. Spleen: Normal in size without focal abnormality. Adrenals/Urinary Tract: Adrenal glands are unremarkable. Kidneys are normal, without renal calculi, focal lesion, or hydronephrosis. Bladder is decompressed. Stomach/Bowel: Stomach is within normal limits. Appendix is surgically absent. No evidence of bowel wall thickening, distention, or inflammatory changes. Vascular/Lymphatic: No significant vascular findings are present. No enlarged abdominal or pelvic lymph nodes. Reproductive: Prostate is unremarkable. Other: No pneumoperitoneum. Surgical drain in the right upper quadrant. Musculoskeletal: No acute or significant osseous findings. IMPRESSION: 1. Recent cholecystectomy with thick walled, loculated, complex fluid collection in the right upper quadrant and gallbladder fossa, exerting mass effect on the liver. The superior, more low-density component measuring up to 19.5 cm is concerning for abscess, with biloma also in the differential. The inferior, more hyperdense component along the anterior abdominal wall measuring up to 17.4 cm is suspicious for hematoma. 2. Small right pleural effusion. Bilateral lower lobe subsegmental atelectasis. Electronically Signed   By: Obie Dredge M.D.   On: 12/07/2016 20:55   Nm Hepato W/eject Fract  Result Date: 12/01/2016 CLINICAL DATA:  Nausea, vomiting, intolerance to fatty foods EXAM: NUCLEAR MEDICINE HEPATOBILIARY  IMAGING TECHNIQUE: Sequential images of the abdomen were obtained out to 60 minutes following intravenous administration of radiopharmaceutical. RADIOPHARMACEUTICALS:  5.1 mCi Tc-63m  Choletec IV COMPARISON:  None. FINDINGS: Prompt uptake and biliary excretion of activity by the liver is seen. The gallbladder, however, is not visualized. The nuclear medicine technologist discussed the case Dr. Chestine Spore who advised giving 3.7 mg of morphine after 90 minutes of imaging.  After administration of the morphine, no activity is seen within the gallbladder. These findings are highly suspicious for cystic duct obstruction. IMPRESSION: The gallbladder is not visualized even after administration of morphine, worrisome for cystic duct obstruction and probable developing acute cholecystitis. Electronically Signed   By: Dwyane Dee M.D.   On: 12/01/2016 14:31   US Abdomen Limited Ruq  Result Date: 11/26/2016 CLINICAL DATA:  Right upper quadrant pain since noon yesterday. EXAM: ULTRASOUND ABDOMEN LIMITED RIGHT UPPER QUADRANT COMPARISON:  None. FINDINGS: Gallbladder: Multiple stones in the gallbladder, largest measuring about 1.7 cm diameter. No gallbladder wall thickening or edema. Murphy's sign is negative. Common bile duct: Diameter: 3.4 mm, normal Liver: Diffusely increased parenchymal echotexture suggesting fatty infiltration. No focal liver lesions identified. Portal vein is patent on color Doppler imaging with normal direction of blood flow towards the liver. IMPRESSION: Cholelithiasis.  No additional changes to suggest cholecystitis. Electronically Signed   By: Burman Nieves M.D.   On: 11/26/2016 04:44    Labs:  CBC:  Recent Labs  12/06/16 0541 12/06/16 1149 12/07/16 0611 12/08/16 0701  WBC 16.5* 18.1* 20.4* 18.5*  HGB 7.6* 7.8* 8.4* 8.5*  HCT 23.2* 24.2* 25.9* 26.8*  PLT 296 284 402* 482*    COAGS:  Recent Labs  12/08/16 0747  INR 1.08    BMP:  Recent Labs  12/04/16 0942 12/06/16 0541 12/07/16 0611 12/08/16 0701  NA 133* 133* 133* 132*  K 4.7 4.0 3.7 4.4  CL 105 102 100* 100*  CO2 21* 26 25 24   GLUCOSE 160* 122* 108* 103*  BUN 31* 7 7 9   CALCIUM 7.4* 7.3* 7.8* 8.0*  CREATININE 1.42* 0.83 0.83 0.88  GFRNONAA >60 >60 >60 >60  GFRAA >60 >60 >60 >60    LIVER FUNCTION TESTS:  Recent Labs  12/02/16 1403 12/04/16 0942 12/06/16 0541 12/07/16 0611  BILITOT 2.0* 1.3* 1.0 1.1  AST 196* 49* 24 22  ALT 432* 197* 89* 71*  ALKPHOS 324*  165* 117 134*  PROT 7.4 6.2* 5.2* 5.7*  ALBUMIN 3.3* 2.8* 2.3* 2.4*    TUMOR MARKERS: No results for input(s): AFPTM, CEA, CA199, CHROMGRNA in the last 8760 hours.  Assessment and Plan: Intra-abdominal fluid collection Patient with past medical history of cholecystitis s/p cholecystectomy presents with complaint of intra-abdominal fluid collection. Patient with surgical drain in place in right lateral abdomen.  IR consulted for aspiration and drainage of intra-abdominal fluid collection at the request of Dr. Derrell Lolling. Case reviewed by Dr. Bonnielee Haff who approves patient for procedure.  Patient presents today in their usual state of health.  He has been NPO as of 8AM and is not currently on blood thinners.  Risks and benefits discussed with the patient including bleeding, infection, damage to adjacent structures, bowel perforation/fistula connection, and sepsis. All of the patient's questions were answered, patient is agreeable to proceed. Consent signed and in chart.  Thank you for this interesting consult.  I greatly enjoyed meeting Vir Whetstine and look forward to participating in their care.  A copy of this report was sent to the requesting provider on  this date.  Electronically Signed: Hoyt KochKacie Sue-Ellen Caitlin Hillmer, PA 12/08/2016, 10:06 AM   I spent a total of 40 Minutes    in face to face in clinical consultation, greater than 50% of which was counseling/coordinating care for intra-abdominal fluid collection.

## 2016-12-08 NOTE — Telephone Encounter (Signed)
Patient is in the hospital and he states they are telling him some things that he is wanting to talk to you about.  Please call him at [phone] .

## 2016-12-08 NOTE — Procedures (Signed)
RUQ abscess drain Bilious fluid EBL 0 Comp 0

## 2016-12-08 NOTE — Progress Notes (Signed)
Patient C/O pain on the affected right side of the abdomen radiating to the left side. Verbalized he's been sweating a lot,too. Alert, vital signs monitored BP-136/83, P-102, T-99.4, R-18, O2 sat-98 on room air. Bowel sounds active in all quadrant. Dilaudid 1mg  given IV. Kept dry. Chiropodist seen patient and informed Dr. Janee Morn with telephone order to give another dose of Dilaudid if needed., CBC in am . Will continue to monitor and report.

## 2016-12-08 NOTE — Progress Notes (Signed)
Back from Radiology department @ 1430hr  via bed.CT guided RUQ abscess drain inserted and attached to drain bag. Dressing dry and intact. Obtained bloody output. Bedrest until further order. Vital signs monitored per order. Voided freely. Verbalized feeling better after the fluid drainage. Will continue to monitor and report appropriately

## 2016-12-08 NOTE — Telephone Encounter (Signed)
Pt returned your call and asked that you call him please

## 2016-12-08 NOTE — Telephone Encounter (Signed)
I tried to call him twice.  Left message.   FYI It appears per hospitalization notes that he is suppose to have an Interventional Radiology specialist aspirate/remove fluid from what appears to be an abscess next to the liver.   I suspect he has questions about this, but in light of the gall bladder surgery and gangrene infection, its not uncommon to have other potential complications like this abscess.

## 2016-12-08 NOTE — Progress Notes (Signed)
5 Days Post-Op    CC:  Abdominal pain  Subjective: He looked and felt worse yesterday afternoon so we ordered a CT at that point.  Results completed late last PM, with results noted below.  He still feels distended and pain RUQ.  He is eating a regular breakfast.  I have made him NPO, but not communicated at this point.  I told him nothing else PO.  Drain is old bloody looking drainage.  AM labs are pending.  Objective: Vital signs in last 24 hours: Temp:  [98.7 F (37.1 C)-99.3 F (37.4 C)] 99.3 F (37.4 C) (11/01 0524) Pulse Rate:  [102-114] 114 (11/01 0524) Resp:  [18-19] 18 (11/01 0524) BP: (116-135)/(67-81) 116/71 (11/01 0524) SpO2:  [95 %-100 %] 95 % (11/01 0524) Last BM Date: 12/06/16 380 Po No fluids - will restart 1800 urine 225 drain Afebrile, VSS Labs pending for this AM CT scan 12/07/16 @ 20:55 hrs:  loculated, complex fluid collection in the right upper quadrant and gallbladder fossa, exerting mass effect on the liver. The superior, more low-density component measuring up to 19.5 cm is concerning for abscess, with biloma also in the differential. The inferior, more hyperdense component along the anterior abdominal wall measuring up to 17.4 cm is suspicious for hematoma. Small right pleural effusion. Bilateral lower lobe subsegmental atelectasis. Intake/Output from previous day: 10/31 0701 - 11/01 0700 In: 430 [P.O.:380; IV Piggyback:50] Out: 2055 [Urine:1800; Drains:255] Intake/Output this shift: No intake/output data recorded.  General appearance: alert, cooperative and no distress Resp: clear to auscultation bilaterally and BS down some in the bases GI: distended some, tender RUQ and bloody drainage that looks like resolving hematoma.  Lab Results:   Recent Labs  12/06/16 1149 12/07/16 0611  WBC 18.1* 20.4*  HGB 7.8* 8.4*  HCT 24.2* 25.9*  PLT 284 402*    BMET  Recent Labs  12/06/16 0541 12/07/16 0611  NA 133* 133*  K 4.0 3.7  CL 102  100*  CO2 26 25  GLUCOSE 122* 108*  BUN 7 7  CREATININE 0.83 0.83  CALCIUM 7.3* 7.8*   PT/INR No results for input(s): LABPROT, INR in the last 72 hours.   Recent Labs Lab 12/02/16 1403 12/04/16 0942 12/06/16 0541 12/07/16 0611  AST 196* 49* 24 22  ALT 432* 197* 89* 71*  ALKPHOS 324* 165* 117 134*  BILITOT 2.0* 1.3* 1.0 1.1  PROT 7.4 6.2* 5.2* 5.7*  ALBUMIN 3.3* 2.8* 2.3* 2.4*     Lipase     Component Value Date/Time   LIPASE 21 11/25/2016 2301     Medications: . acetaminophen  1,000 mg Oral Q8H  . metoprolol succinate  25 mg Oral Q breakfast  . pantoprazole  40 mg Oral Daily   . dextrose 5% lactated ringers 75 mL/hr at 12/07/16 1926  . lactated ringers 10 mL/hr at 12/03/16 0727  . piperacillin-tazobactam (ZOSYN)  IV 3.375 g (12/08/16 0520)   Anti-infectives    Start     Dose/Rate Route Frequency Ordered Stop   12/07/16 0830  piperacillin-tazobactam (ZOSYN) IVPB 3.375 g     3.375 g 12.5 mL/hr over 240 Minutes Intravenous Every 8 hours 12/07/16 0736     12/02/16 1500  cefTRIAXone (ROCEPHIN) 2 g in dextrose 5 % 50 mL IVPB  Status:  Discontinued     2 g 100 mL/hr over 30 Minutes Intravenous Every 24 hours 12/02/16 1337 12/07/16 0736      Assessment/Plan Gangrenous cholecystitis with cholelithiasis  Laparoscopic cholecystectomy, 19 [place]  Blake drain placement, gangrenous changes involving the entirety of the back wall, short cystic duct precluded cholangiogram10/27/18, Dr. Violeta Gelinas Increased pain and distension =>> CT 10/31:  19.5 cm fluid collection - Biloma/abscess  17.4 cm possible hematoma IR drain evaluation  Acute kidney injury: Creatinine 1.07/0.97/1.42 12/04/16  =>> 0.83 today Hold lisinopril/increase Toprol, and decease fluids some.    Leukocytosis -   Anemia - post op  No DVT chemoprophylaxis   RLL pleural effusion/atelectasis -  IS  Active problems: Hypertension - Toprol - lower dose, still tachycardic, good BP  Borderline CHF -  Grade 2 diastolic dysfunction/EF 44% Hyperlipidemia History of thyroid disease FEN: IV fluids/soft diet ID: Rocephin 10/26 - 10/31.  Zosyn 10/31 =>> day 2 DVT: SCD's/ no anticoagulation with drop in H/H Foley: none Follow up: Dr Janee Morn     Plan:  Ir evaluation for drain, continue Zosyn, continue IV fluids, NPO for now.  Labs show NA down, WBC still 18.5.  Change fluids to D5NS + kcl.  IR plans to drain later today.      LOS: 5 days    Enya Bureau 12/08/2016 [phone]

## 2016-12-09 LAB — CBC
HEMATOCRIT: 25.5 % — AB (ref 39.0–52.0)
HEMOGLOBIN: 8.5 g/dL — AB (ref 13.0–17.0)
MCH: 31.1 pg (ref 26.0–34.0)
MCHC: 33.3 g/dL (ref 30.0–36.0)
MCV: 93.4 fL (ref 78.0–100.0)
Platelets: 512 10*3/uL — ABNORMAL HIGH (ref 150–400)
RBC: 2.73 MIL/uL — AB (ref 4.22–5.81)
RDW: 14.3 % (ref 11.5–15.5)
WBC: 16.2 10*3/uL — ABNORMAL HIGH (ref 4.0–10.5)

## 2016-12-09 MED ORDER — TAB-A-VITE/IRON PO TABS
1.0000 | ORAL_TABLET | Freq: Every day | ORAL | Status: DC
  Administered 2016-12-09 – 2016-12-24 (×14): 1 via ORAL
  Filled 2016-12-09 (×18): qty 1

## 2016-12-09 MED ORDER — HEPARIN SODIUM (PORCINE) 5000 UNIT/ML IJ SOLN
5000.0000 [IU] | Freq: Three times a day (TID) | INTRAMUSCULAR | Status: DC
  Administered 2016-12-09 – 2016-12-11 (×7): 5000 [IU] via SUBCUTANEOUS
  Filled 2016-12-09 (×7): qty 1

## 2016-12-09 NOTE — Progress Notes (Signed)
Chief Complaint: Patient was seen today for follow up RUQ drain placed 11/1  Referring Physician(s): [facility] Dr. Derrell Lolling, Dr. Magnus Ivan  Supervising Physician: Simonne Come  Patient Status: [facility] - In-pt  Subjective: S/p perc drain to RUQ fluid collection. Pt sitting up in chair. C/o some soreness at new drain site.  Objective: Physical Exam: BP 115/61 (BP Location: Left Arm)   Pulse 97   Temp 98.9 F (37.2 C) (Oral)   Resp 20   Ht 5\' 5"  (1.651 m)   Wt 202 lb (91.6 kg)   SpO2 97%   BMI 33.61 kg/m  RUQ drain intact, site clean, mildly tender Output in tubing and bag, thin rust colored, serosanguinous v bilious.   Current Facility-Administered Medications:  .  acetaminophen (TYLENOL) tablet 1,000 mg, 1,000 mg, Oral, Q8H, Sherrie George, PA-C, 1,000 mg at 12/08/16 2356 .  alum & mag hydroxide-simeth (MAALOX/MYLANTA) 200-200-20 MG/5ML suspension 30 mL, 30 mL, Oral, Q4H PRN, Violeta Gelinas, MD, 30 mL at 12/07/16 1354 .  codeine tablet 30-60 mg, 30-60 mg, Oral, Q4H PRN, Sherrie George, PA-C, 30 mg at 12/09/16 0349 .  dextrose 5 % and 0.9 % NaCl with KCl 20 mEq/L infusion, , Intravenous, Continuous, Sherrie George, PA-C, Last Rate: 100 mL/hr at 12/09/16 0149 .  heparin injection 5,000 Units, 5,000 Units, Subcutaneous, Q8H, Sherrie George, PA-C .  HYDROmorphone (DILAUDID) injection 1 mg, 1 mg, Intravenous, Q1H PRN, Abigail Miyamoto, MD, 1 mg at 12/09/16 2919 .  lactated ringers infusion, , Intravenous, Continuous, Kipp Brood, MD, Last Rate: 10 mL/hr at 12/03/16 0727 .  metoprolol succinate (TOPROL-XL) 24 hr tablet 25 mg, 25 mg, Oral, Q breakfast, Sherrie George, PA-C, 25 mg at 12/09/16 0801 .  multivitamins with iron tablet 1 tablet, 1 tablet, Oral, QPC supper, Sherrie George, PA-C .  ondansetron (ZOFRAN-ODT) disintegrating tablet 4 mg, 4 mg, Oral, Q6H PRN **OR** ondansetron (ZOFRAN) injection 4 mg, 4 mg, Intravenous, Q6H PRN, Abigail Miyamoto, MD, 4 mg at  12/07/16 1354 .  pantoprazole (PROTONIX) EC tablet 40 mg, 40 mg, Oral, Daily, Axel Filler, MD, 40 mg at 12/08/16 1036 .  piperacillin-tazobactam (ZOSYN) IVPB 3.375 g, 3.375 g, Intravenous, Q8H, Sherrie George, [place], Last Rate: 12.5 mL/hr at 12/09/16 0530, 3.375 g at 12/09/16 0530  Labs: CBC  Recent Labs  12/08/16 0701 12/09/16 0334  WBC 18.5* 16.2*  HGB 8.5* 8.5*  HCT 26.8* 25.5*  PLT 482* 512*   BMET  Recent Labs  12/07/16 0611 12/08/16 0701  NA 133* 132*  K 3.7 4.4  CL 100* 100*  CO2 25 24  GLUCOSE 108* 103*  BUN 7 9  CREATININE 0.83 0.88  CALCIUM 7.8* 8.0*   LFT  Recent Labs  12/07/16 0611  PROT 5.7*  ALBUMIN 2.4*  AST 22  ALT 71*  ALKPHOS 134*  BILITOT 1.1   PT/INR  Recent Labs  12/08/16 0747  LABPROT 13.9  INR 1.08     Studies/Results: Ct Abdomen Pelvis W Contrast  Result Date: 12/07/2016 CLINICAL DATA:  Abdominal pain. Recent cholecystectomy on 12/03/2016. EXAM: CT ABDOMEN AND PELVIS WITH CONTRAST TECHNIQUE: Multidetector CT imaging of the abdomen and pelvis was performed using the standard protocol following bolus administration of intravenous contrast. CONTRAST:  < 100 cc> ISOVUE-300 IOPAMIDOL (ISOVUE-300) INJECTION 61% COMPARISON:  Abdominal ultrasound dated November 26, 2016. FINDINGS: Lower chest: Small right pleural effusion. Bilateral lower lobe subsegmental atelectasis. Hepatobiliary: No focal liver abnormality. The gallbladder is surgically absent. No biliary dilatation. There is a large, loculated, thick-walled  fluid collection in the right upper quadrant which exerts mass effect on the liver. The collection extends into the gallbladder fossa, and along the anterior abdominal wall, where it is more hyperdense. The more superior, lower density component of the fluid collection measures approximately 19.5 x 8.3 x 13.1 cm (AP by transverse by CC), while the more hyperdense, inferior component along the anterior abdominal wall measures  approximately 6.4 x 17.4 x 8.3 cm (AP by transverse by CC). Pancreas: Unremarkable. No pancreatic ductal dilatation or surrounding inflammatory changes. Spleen: Normal in size without focal abnormality. Adrenals/Urinary Tract: Adrenal glands are unremarkable. Kidneys are normal, without renal calculi, focal lesion, or hydronephrosis. Bladder is decompressed. Stomach/Bowel: Stomach is within normal limits. Appendix is surgically absent. No evidence of bowel wall thickening, distention, or inflammatory changes. Vascular/Lymphatic: No significant vascular findings are present. No enlarged abdominal or pelvic lymph nodes. Reproductive: Prostate is unremarkable. Other: No pneumoperitoneum. Surgical drain in the right upper quadrant. Musculoskeletal: No acute or significant osseous findings. IMPRESSION: 1. Recent cholecystectomy with thick walled, loculated, complex fluid collection in the right upper quadrant and gallbladder fossa, exerting mass effect on the liver. The superior, more low-density component measuring up to 19.5 cm is concerning for abscess, with biloma also in the differential. The inferior, more hyperdense component along the anterior abdominal wall measuring up to 17.4 cm is suspicious for hematoma. 2. Small right pleural effusion. Bilateral lower lobe subsegmental atelectasis. Electronically Signed   By: Obie DredgeWilliam T Derry M.D.   On: 12/07/2016 20:55   Ct Image Guided Fluid Drain By Catheter  Result Date: 12/08/2016 INDICATION: Perihepatic abscess EXAM: CT-GUIDED ABSCESS DRAIN MEDICATIONS: The patient is currently admitted to the hospital and receiving intravenous antibiotics. The antibiotics were administered within an appropriate time frame prior to the initiation of the procedure. ANESTHESIA/SEDATION: Fentanyl 50 mcg IV; Versed 1 mg IV Moderate Sedation Time:  13 The patient was continuously monitored during the procedure by the interventional radiology nurse under my direct supervision.  COMPLICATIONS: None immediate. PROCEDURE: Informed written consent was obtained from the patient after a thorough discussion of the procedural risks, benefits and alternatives. All questions were addressed. Maximal Sterile Barrier Technique was utilized including caps, mask, sterile gowns, sterile gloves, sterile drape, hand hygiene and skin antiseptic. A timeout was performed prior to the initiation of the procedure. The right upper quadrant was prepped and draped in a sterile fashion. 1% lidocaine was utilized for local infiltration. Under CT guidance, an 18 gauge needle was advanced into the perihepatic abscess and removed over an Amplatz wire. A 12 French dilator followed by a 12 [place] drain were inserted. It was looped and string fixed then sewn to the skin. Bilious fluid was aspirated. FINDINGS: Images demonstrate placement of a 6812 French drain into a perihepatic abscess. IMPRESSION: Successful CT-guided perihepatic abscess drain yielding bilious fluid. Electronically Signed   By: Jolaine ClickArthur  Hoss M.D.   On: 12/08/2016 15:07    Assessment/Plan: S/p lap chole Post op RUQ fluid collection S/p perc drain 11/1 WBC down. Monitor output, possibly bilious.    LOS: 6 days   I spent a total of 20 minutes in face to face in clinical consultation, greater than 50% of which was counseling/coordinating care for RUQ perc drain  Brayton ElBRUNING, Malesha Suliman PA-C 12/09/2016 9:18 AM

## 2016-12-09 NOTE — Progress Notes (Signed)
6 Days Post-Op    Chief complaint: Abdominal pain  Subjective: Patient sitting up in a chair alert, he looks better than yesterday.  Of discomfort pointing to the left side as well as the right.  Taking p.o. pain medicine.  His IR drain bag is currently empty.  What is in the tubing and what she can see in the bag looks like old resolving hematoma serosanguineous fluid.  Nothing cloudy appearing.  The same is true his Jackson-Pratt drain.  Objective: Vital signs in last 24 hours: Temp:  [98.9 F (37.2 C)-101 F (38.3 C)] 98.9 F (37.2 C) (11/02 0534) Pulse Rate:  [97-121] 97 (11/02 0534) Resp:  [20-35] 20 (11/02 0534) BP: (115-142)/(61-94) 115/61 (11/02 0534) SpO2:  [92 %-98 %] 97 % (11/02 0534) Last BM Date: 12/08/16 NPO 1900 IV Urine 2120 urine 2035 drain Afebrile, VSS WBC down to 16.2 H/H is stable  Intake/Output from previous day: 11/01 0701 - 11/02 0700 In: 1923.3 [I.V.:1873.3; IV Piggyback:50] Out: 4155 [Urine:2120; Drains:2035] Intake/Output this shift: Total I/O In: -  Out: 625 [Urine:450; Drains:175]  General appearance: alert, cooperative and no distress Resp: clear to auscultation bilaterally GI: He is up in the chair so is hard to do a really good exam on him.  He looks fairly comfortable up in the chair.  No peritonitis on exam.  Positive bowel sounds.  Port sites all look good.  JP drain and IR drain appeared to be serosanguineous fluid.  Lab Results:   Recent Labs  12/08/16 0701 12/09/16 0334  WBC 18.5* 16.2*  HGB 8.5* 8.5*  HCT 26.8* 25.5*  PLT 482* 512*    BMET  Recent Labs  12/07/16 0611 12/08/16 0701  NA 133* 132*  K 3.7 4.4  CL 100* 100*  CO2 25 24  GLUCOSE 108* 103*  BUN 7 9  CREATININE 0.83 0.88  CALCIUM 7.8* 8.0*   PT/INR  Recent Labs  12/08/16 0747  LABPROT 13.9  INR 1.08     Recent Labs Lab 12/02/16 1403 12/04/16 0942 12/06/16 0541 12/07/16 0611  AST 196* 49* 24 22  ALT 432* 197* 89* 71*  ALKPHOS 324* 165*  117 134*  BILITOT 2.0* 1.3* 1.0 1.1  PROT 7.4 6.2* 5.2* 5.7*  ALBUMIN 3.3* 2.8* 2.3* 2.4*     Lipase     Component Value Date/Time   LIPASE 21 11/25/2016 2301     Medications: . acetaminophen  1,000 mg Oral Q8H  . metoprolol succinate  25 mg Oral Q breakfast  . pantoprazole  40 mg Oral Daily   . dextrose 5 % and 0.9 % NaCl with KCl 20 mEq/L 100 mL/hr at 12/09/16 0149  . lactated ringers 10 mL/hr at 12/03/16 0727  . piperacillin-tazobactam (ZOSYN)  IV 3.375 g (12/09/16 0530)   Anti-infectives    Start     Dose/Rate Route Frequency Ordered Stop   12/07/16 0830  piperacillin-tazobactam (ZOSYN) IVPB 3.375 g     3.375 g 12.5 mL/hr over 240 Minutes Intravenous Every 8 hours 12/07/16 0736     12/02/16 1500  cefTRIAXone (ROCEPHIN) 2 g in dextrose 5 % 50 mL IVPB  Status:  Discontinued     2 g 100 mL/hr over 30 Minutes Intravenous Every 24 hours 12/02/16 1337 12/07/16 0736      Assessment/Plan Gangrenous cholecystitis with cholelithiasis  Laparoscopic cholecystectomy, 19 French Blake drain placement, gangrenous changes involving the entirety of the back wall, short cystic duct precluded cholangiogram10/27/18, Dr. Violeta GelinasBurke Thompson  POD 6 Increased pain  and distension =>> CT 10/31:  19.5 cm fluid collection - Biloma/abscess  17.4 cm possible hematoma IR drain Placed 12/08/16, 1050 In drain on return from the floor and 2035 ml total yesterday.    Acute kidney injury: Creatinine 1.07/0.97/1.42 12/04/16 =>>0.83 today Hold lisinopril/increase Toprol, and decease fluids some.   Leukocytosis -   Anemia - post op   restart heparin for DVT prophylaxis, - add MVI with FE tonight  RLL pleural effusion/atelectasis -  IS  Active problems: Hypertension - Toprol - lower dose, still tachycardic, good BP  Hx of Borderline CHF - Grade 2 diastolic dysfunction/EF 44% Hyperlipidemia History of thyroid disease FEN: IV fluids/soft diet ID: Rocephin 10/26 -10/31.  Zosyn 10/31 =>> day   3 DVT: SCD's/  -restart heparin today and watch hemoglobin hematocrit closely. Foley: none Follow up: Dr Janee Morn   Plan: Continue IR drain, JP drain, continue antibiotics, continue IV fluids.  Started back on clears and advance his diet as tolerated.  Recheck all his labs again in the a.m.      LOS: 6 days    Yosselyn Tax 12/09/2016 [phone]

## 2016-12-09 NOTE — Telephone Encounter (Signed)
I spoke to patient earlier today.  He has questions about a piece of gall bladder that was left " in his liver" from surgery.  Wants to know if this is going to cause other problems in days or weeks to come?  I will reviewed hospital and operative reports notes.   I advised he follow the recommendation of the hospitalization who are currently taking care of him.

## 2016-12-09 NOTE — Progress Notes (Signed)
Pt ambulated multiple laps on unit independently this pm

## 2016-12-10 LAB — CBC
HCT: 28.2 % — ABNORMAL LOW (ref 39.0–52.0)
HEMOGLOBIN: 8.7 g/dL — AB (ref 13.0–17.0)
MCH: 28.8 pg (ref 26.0–34.0)
MCHC: 30.9 g/dL (ref 30.0–36.0)
MCV: 93.4 fL (ref 78.0–100.0)
Platelets: 647 10*3/uL — ABNORMAL HIGH (ref 150–400)
RBC: 3.02 MIL/uL — AB (ref 4.22–5.81)
RDW: 14.3 % (ref 11.5–15.5)
WBC: 16 10*3/uL — ABNORMAL HIGH (ref 4.0–10.5)

## 2016-12-10 LAB — COMPREHENSIVE METABOLIC PANEL
ALT: 38 U/L (ref 17–63)
AST: 18 U/L (ref 15–41)
Albumin: 2.2 g/dL — ABNORMAL LOW (ref 3.5–5.0)
Alkaline Phosphatase: 134 U/L — ABNORMAL HIGH (ref 38–126)
Anion gap: 7 (ref 5–15)
BUN: 7 mg/dL (ref 6–20)
CHLORIDE: 103 mmol/L (ref 101–111)
CO2: 24 mmol/L (ref 22–32)
CREATININE: 0.88 mg/dL (ref 0.61–1.24)
Calcium: 8 mg/dL — ABNORMAL LOW (ref 8.9–10.3)
Glucose, Bld: 105 mg/dL — ABNORMAL HIGH (ref 65–99)
POTASSIUM: 4.1 mmol/L (ref 3.5–5.1)
SODIUM: 134 mmol/L — AB (ref 135–145)
Total Bilirubin: 1.2 mg/dL (ref 0.3–1.2)
Total Protein: 6.2 g/dL — ABNORMAL LOW (ref 6.5–8.1)

## 2016-12-10 MED ORDER — OXYCODONE HCL 5 MG PO TABS
5.0000 mg | ORAL_TABLET | ORAL | Status: DC | PRN
  Administered 2016-12-10 – 2016-12-20 (×21): 5 mg via ORAL
  Filled 2016-12-10 (×26): qty 1

## 2016-12-10 MED ORDER — DOCUSATE SODIUM 100 MG PO CAPS
100.0000 mg | ORAL_CAPSULE | Freq: Two times a day (BID) | ORAL | Status: DC
  Administered 2016-12-10 – 2016-12-25 (×22): 100 mg via ORAL
  Filled 2016-12-10 (×28): qty 1

## 2016-12-10 MED ORDER — DEXTROSE 5 % IV SOLN
500.0000 mg | Freq: Four times a day (QID) | INTRAVENOUS | Status: DC | PRN
  Administered 2016-12-10 – 2016-12-12 (×3): 500 mg via INTRAVENOUS
  Filled 2016-12-10 (×3): qty 5

## 2016-12-10 NOTE — Progress Notes (Signed)
6 Days Post-Op    Chief complaint: Abdominal pain  Subjective: Patient sitting up in a chair alert,he reports more pain today around IR drain with spasms.  Taking p.o. pain medicine.  His IR drain bag is currently full of dark brown-red fluid.  The same is true his Jackson-Pratt drain.  Objective: Vital signs in last 24 hours: Temp:  [98.7 F (37.1 C)-99.6 F (37.6 C)] 99 F (37.2 C) (11/03 0513) Pulse Rate:  [96-103] 96 (11/03 0513) Resp:  [18-20] 18 (11/03 0513) BP: (118-131)/(64-73) 120/73 (11/03 0513) SpO2:  [97 %-98 %] 97 % (11/03 0513) Last BM Date: 12/09/16   Intake/Output from previous day: 11/02 0701 - 11/03 0700 In: 2793.3 [P.O.:340; I.V.:2353.3; IV Piggyback:100] Out: 4540 [Urine:3640; Drains:900] Intake/Output this shift: No intake/output data recorded.  General appearance: alert, cooperative and no distress Resp: clear to auscultation bilaterally GI:  He looks fairly comfortable up in the chair.  No peritonitis on exam.  Positive bowel sounds.  Port sites all look good.  JP drain and IR drain contain lg volume dark brown-red fluid.  Lab Results:   Recent Labs  12/09/16 0334 12/10/16 0608  WBC 16.2* 16.0*  HGB 8.5* 8.7*  HCT 25.5* 28.2*  PLT 512* 647*    BMET  Recent Labs  12/08/16 0701 12/10/16 0608  NA 132* 134*  K 4.4 4.1  CL 100* 103  CO2 24 24  GLUCOSE 103* 105*  BUN 9 7  CREATININE 0.88 0.88  CALCIUM 8.0* 8.0*   PT/INR  Recent Labs  12/08/16 0747  LABPROT 13.9  INR 1.08     Recent Labs Lab 12/04/16 0942 12/06/16 0541 12/07/16 0611 12/10/16 0608  AST 49* 24 22 18   ALT 197* 89* 71* 38  ALKPHOS 165* 117 134* 134*  BILITOT 1.3* 1.0 1.1 1.2  PROT 6.2* 5.2* 5.7* 6.2*  ALBUMIN 2.8* 2.3* 2.4* 2.2*     Lipase     Component Value Date/Time   LIPASE 21 11/25/2016 2301     Medications: . acetaminophen  1,000 mg Oral Q8H  . docusate sodium  100 mg Oral BID  . heparin subcutaneous  5,000 Units Subcutaneous Q8H  .  metoprolol succinate  25 mg Oral Q breakfast  . multivitamins with iron  1 tablet Oral QPC supper  . pantoprazole  40 mg Oral Daily   . methocarbamol (ROBAXIN)  IV    . piperacillin-tazobactam (ZOSYN)  IV 3.375 g (12/10/16 0514)   Anti-infectives    Start     Dose/Rate Route Frequency Ordered Stop   12/07/16 0830  piperacillin-tazobactam (ZOSYN) IVPB 3.375 g     3.375 g 12.5 mL/hr over 240 Minutes Intravenous Every 8 hours 12/07/16 0736     12/02/16 1500  cefTRIAXone (ROCEPHIN) 2 g in dextrose 5 % 50 mL IVPB  Status:  Discontinued     2 g 100 mL/hr over 30 Minutes Intravenous Every 24 hours 12/02/16 1337 12/07/16 0736      Assessment/Plan Gangrenous cholecystitis with cholelithiasis  Laparoscopic cholecystectomy, 19 French Blake drain placement, gangrenous changes involving the entirety of the back wall, short cystic duct precluded cholangiogram10/27/18, Dr. Violeta GelinasBurke Thompson  POD 6 Increased pain and distension =>> CT 10/31:  19.5 cm fluid collection - Biloma/abscess  17.4 cm possible hematoma IR drain Placed 12/08/16, output remains HIGH    Acute kidney injury: improving Hold lisinopril/increase Toprol, will stop IVF  Leukocytosis - stable  Anemia - post op   restart heparin for DVT prophylaxis, - add MVI  with FE tonight  RLL pleural effusion/atelectasis -  IS  Active problems: Hypertension - Toprol - lower dose, still tachycardic, good BP  Hx of Borderline CHF - Grade 2 diastolic dysfunction/EF 44% Hyperlipidemia History of thyroid disease FEN: IV fluids/soft diet ID: Rocephin 10/26 -10/31.  Zosyn 10/31 =>>  DVT: SCD's/  -Hep, and watch hemoglobin hematocrit closely. Foley: none Follow up: Dr Janee Morn   Plan: Continue IR drain, JP drain, continue antibiotics, stop IV fluids.  Advance his diet as tolerated. Will add oxycodone and robaxin for pain.  Recheck all his labs again in the a.m. If drain output remains high volume will get HIDA.       LOS: 7 days     Berna Bue 12/10/2016 [phone]

## 2016-12-11 ENCOUNTER — Inpatient Hospital Stay (HOSPITAL_COMMUNITY): Payer: No Typology Code available for payment source

## 2016-12-11 ENCOUNTER — Other Ambulatory Visit: Payer: Self-pay

## 2016-12-11 LAB — CBC
HEMATOCRIT: 27.7 % — AB (ref 39.0–52.0)
Hemoglobin: 8.6 g/dL — ABNORMAL LOW (ref 13.0–17.0)
MCH: 28.9 pg (ref 26.0–34.0)
MCHC: 31 g/dL (ref 30.0–36.0)
MCV: 93 fL (ref 78.0–100.0)
PLATELETS: 685 10*3/uL — AB (ref 150–400)
RBC: 2.98 MIL/uL — AB (ref 4.22–5.81)
RDW: 14.2 % (ref 11.5–15.5)
WBC: 13.8 10*3/uL — AB (ref 4.0–10.5)

## 2016-12-11 LAB — COMPREHENSIVE METABOLIC PANEL
ALT: 32 U/L (ref 17–63)
ANION GAP: 8 (ref 5–15)
AST: 21 U/L (ref 15–41)
Albumin: 2.2 g/dL — ABNORMAL LOW (ref 3.5–5.0)
Alkaline Phosphatase: 138 U/L — ABNORMAL HIGH (ref 38–126)
BUN: 7 mg/dL (ref 6–20)
CHLORIDE: 99 mmol/L — AB (ref 101–111)
CO2: 25 mmol/L (ref 22–32)
Calcium: 8 mg/dL — ABNORMAL LOW (ref 8.9–10.3)
Creatinine, Ser: 0.93 mg/dL (ref 0.61–1.24)
GFR calc non Af Amer: >60 mL/min (ref >60)
GLUCOSE: 97 mg/dL (ref 65–99)
Potassium: 4 mmol/L (ref 3.5–5.1)
Sodium: 132 mmol/L — ABNORMAL LOW (ref 135–145)
Total Bilirubin: 1 mg/dL (ref 0.3–1.2)
Total Protein: 6 g/dL — ABNORMAL LOW (ref 6.5–8.1)

## 2016-12-11 MED ORDER — TECHNETIUM TC 99M MEBROFENIN IV KIT
5.6000 | PACK | Freq: Once | INTRAVENOUS | Status: AC | PRN
  Administered 2016-12-11: 5.6 via INTRAVENOUS

## 2016-12-11 MED ORDER — HEPARIN SODIUM (PORCINE) 5000 UNIT/ML IJ SOLN
5000.0000 [IU] | Freq: Three times a day (TID) | INTRAMUSCULAR | Status: DC
  Administered 2016-12-12 – 2016-12-22 (×22): 5000 [IU] via SUBCUTANEOUS
  Filled 2016-12-11 (×26): qty 1

## 2016-12-11 MED ORDER — SODIUM CHLORIDE 0.9 % IV SOLN
INTRAVENOUS | Status: DC
  Administered 2016-12-11 – 2016-12-12 (×3): via INTRAVENOUS

## 2016-12-11 MED ORDER — HEPARIN SODIUM (PORCINE) 5000 UNIT/ML IJ SOLN
5000.0000 [IU] | Freq: Three times a day (TID) | INTRAMUSCULAR | Status: AC
  Administered 2016-12-11: 5000 [IU] via SUBCUTANEOUS
  Filled 2016-12-11: qty 1

## 2016-12-11 NOTE — Plan of Care (Signed)
  Education: Knowledge of [facility] Education information/materials will improve 12/11/2016 1248 - Progressing by Allene Pyo, RN   Safety: Ability to remain free from injury will improve 12/11/2016 1248 - Progressing by Allene Pyo, RN   Progressing Education: Knowledge of [place] Education information/materials will improve 12/11/2016 1248 - Progressing by Allene Pyo, RN Safety: Ability to remain free from injury will improve 12/11/2016 1248 - Progressing by Allene Pyo, RN Skin Integrity: Risk for impaired skin integrity will decrease 12/11/2016 1248 - Progressing by Allene Pyo, RN Tissue Perfusion: Risk factors for ineffective tissue perfusion will decrease 12/11/2016 1248 - Progressing by Allene Pyo, RN Bowel/Gastric: Will not experience complications related to bowel motility 12/11/2016 1248 - Progressing by Allene Pyo, RN Activity: Ability to tolerate increased activity will improve 12/11/2016 1248 - Progressing by Allene Pyo, RN Mobility will improve 12/11/2016 1248 - Progressing by Allene Pyo, RN Cardiac: Ability to maintain an adequate cardiac output will improve 12/11/2016 1248 - Progressing by Allene Pyo, RN Nutrition: Ability to attain and maintain optimal nutritional status will improve 12/11/2016 1248 - Progressing by Allene Pyo, RN

## 2016-12-11 NOTE — Progress Notes (Signed)
Patient profusely sweating, c/o "not feeling well". Stated no unusual pain, just sweating so much. Vitals obtained, notified [facility] [place], [place].  Orders were placed after PA assessed patient.

## 2016-12-11 NOTE — Progress Notes (Signed)
HIDA c/w with bile leak Cont abx Cont perc drain Spoke with Dr Bosie Clos with [facility] - will see in AM  Bobby Kelly. Andrey Campanile, MD, FACS General, Bariatric, & Minimally Invasive Surgery [facility], [place]

## 2016-12-11 NOTE — Progress Notes (Signed)
8 Days Post-Op    CC: Abdominal pain  Subjective: Nurse called and said he was having sweats without fever.  She reports he is extremely diaphoretic with a normal blood pressure some tachycardia heart rate up to 117.  Repeat rectal temp is 100.7.  Still complaining of pain in the right upper quadrant.  Both drains continue to put out reddish brown clear fluid.  Nurse says it looks like iron infusion when she drains it.  Objective: Vital signs in last 24 hours: Temp:  [98.6 F (37 C)-99.6 F (37.6 C)] 98.8 F (37.1 C) (11/04 0931) Pulse Rate:  [99-117] 117 (11/04 0931) Resp:  [18] 18 (11/04 0931) BP: (119-127)/(73-89) 121/75 (11/04 0931) SpO2:  [99 %-100 %] 99 % (11/04 0931) Last BM Date: 12/10/16 360 PO Voided x 4 845 thru drain BM x 1 Afebrile, oral temp, HR up some NA 132 WBC improving 13.8 today H/H is stable  Intake/Output from previous day: 11/03 0701 - 11/04 0700 In: 520 [P.O.:360; IV Piggyback:160] Out: 845 [Drains:845] Intake/Output this shift: No intake/output data recorded.  General appearance: alert, cooperative, no distress and Says his abdomen hurts a little bit more today.  He was up walking and looked good earlier this a.m. Resp: clear to auscultation bilaterally GI: Soft, sore, drains as noted above.  Lab Results:  Recent Labs    12/10/16 0608 12/11/16 0539  WBC 16.0* 13.8*  HGB 8.7* 8.6*  HCT 28.2* 27.7*  PLT 647* 685*    BMET Recent Labs    12/10/16 0608 12/11/16 0539  NA 134* 132*  K 4.1 4.0  CL 103 99*  CO2 24 25  GLUCOSE 105* 97  BUN 7 7  CREATININE 0.88 0.93  CALCIUM 8.0* 8.0*   PT/INR No results for input(s): LABPROT, INR in the last 72 hours.  Recent Labs  Lab 12/06/16 0541 12/07/16 0611 12/10/16 0608 12/11/16 0539  AST 24 22 18 21   ALT 89* 71* 38 32  ALKPHOS 117 134* 134* 138*  BILITOT 1.0 1.1 1.2 1.0  PROT 5.2* 5.7* 6.2* 6.0*  ALBUMIN 2.3* 2.4* 2.2* 2.2*     Lipase     Component Value Date/Time   LIPASE 21  11/25/2016 2301     Medications: . acetaminophen  1,000 mg Oral Q8H  . docusate sodium  100 mg Oral BID  . heparin subcutaneous  5,000 Units Subcutaneous Q8H  . metoprolol succinate  25 mg Oral Q breakfast  . multivitamins with iron  1 tablet Oral QPC supper  . pantoprazole  40 mg Oral Daily   . methocarbamol (ROBAXIN)  IV 500 mg (12/11/16 0515)  . piperacillin-tazobactam (ZOSYN)  IV 3.375 g (12/11/16 0509)   Anti-infectives (From admission, onward)   Start     Dose/Rate Route Frequency Ordered Stop   12/07/16 0830  piperacillin-tazobactam (ZOSYN) IVPB 3.375 g     3.375 g 12.5 mL/hr over 240 Minutes Intravenous Every 8 hours 12/07/16 0736     12/02/16 1500  cefTRIAXone (ROCEPHIN) 2 g in dextrose 5 % 50 mL IVPB  Status:  Discontinued     2 g 100 mL/hr over 30 Minutes Intravenous Every 24 hours 12/02/16 1337 12/07/16 0736     Assessment/Plan Gangrenous cholecystitis with cholelithiasis  Laparoscopic cholecystectomy, 19 French Blake drain placement, gangrenous changes involving the entirety of the back wall, short cystic duct precluded cholangiogram10/27/18, Dr. Violeta Gelinas  POD 6 Increased pain and distension =>>CT 10/31: 19.5 cm fluid collection - Biloma/abscess 17.4 cm possible hematoma IR  drain Placed 12/08/16, 1050 In drain on return from the floor and 2035 ml total yesterday.   900 IR drain 11/2 845 IR drain 11/3 HIDA ordered  Acute kidney injury: Creatinine 1.07/0.97/1.42 12/04/16 =>>0.83 today Hold lisinopril/increase Toprol, and decease fluids some.   Hyponatremia - restart IV fluids  Leukocytosis - Improving  Anemia - post op restart heparin for DVT prophylaxis, - add MVI with FE tonight  RLL pleural effusion/atelectasis - IS  Active problems: Hypertension - Toprol - lower dose, still tachycardic, good BP  Hx of Borderline CHF - Grade 2 diastolic dysfunction/EF 44% Hyperlipidemia History of thyroid disease FEN: IV fluids/soft diet ID: Rocephin  10/26 -10/31. Zosyn 10/31 =>>day  5 DVT: SCD's/  -restart heparin today and watch hemoglobin hematocrit closely. Foley: none Follow up: Dr Janee Mornhompson    Plan: I have ordered blood cultures x2.  We will also order the HIDA scan this a.m. Restart NS for hyponatremia. Check orthostatics, daily weights.   LOS: 8 days    Theoren Palka 12/11/2016 [phone]

## 2016-12-12 LAB — COMPREHENSIVE METABOLIC PANEL
ALK PHOS: 134 U/L — AB (ref 38–126)
ALT: 28 U/L (ref 17–63)
ANION GAP: 7 (ref 5–15)
AST: 20 U/L (ref 15–41)
Albumin: 2.4 g/dL — ABNORMAL LOW (ref 3.5–5.0)
BUN: 9 mg/dL (ref 6–20)
CO2: 25 mmol/L (ref 22–32)
CREATININE: 0.94 mg/dL (ref 0.61–1.24)
Calcium: 8 mg/dL — ABNORMAL LOW (ref 8.9–10.3)
Chloride: 101 mmol/L (ref 101–111)
Glucose, Bld: 96 mg/dL (ref 65–99)
Potassium: 3.6 mmol/L (ref 3.5–5.1)
SODIUM: 133 mmol/L — AB (ref 135–145)
TOTAL PROTEIN: 6.6 g/dL (ref 6.5–8.1)
Total Bilirubin: 1 mg/dL (ref 0.3–1.2)

## 2016-12-12 LAB — CBC
HCT: 30.5 % — ABNORMAL LOW (ref 39.0–52.0)
HEMOGLOBIN: 9.6 g/dL — AB (ref 13.0–17.0)
MCH: 29.1 pg (ref 26.0–34.0)
MCHC: 31.5 g/dL (ref 30.0–36.0)
MCV: 92.4 fL (ref 78.0–100.0)
PLATELETS: 798 10*3/uL — AB (ref 150–400)
RBC: 3.3 MIL/uL — AB (ref 4.22–5.81)
RDW: 14.3 % (ref 11.5–15.5)
WBC: 15.1 10*3/uL — ABNORMAL HIGH (ref 4.0–10.5)

## 2016-12-12 MED ORDER — KCL IN DEXTROSE-NACL 20-5-0.9 MEQ/L-%-% IV SOLN
INTRAVENOUS | Status: DC
  Administered 2016-12-12 – 2016-12-19 (×16): via INTRAVENOUS
  Filled 2016-12-12 (×23): qty 1000

## 2016-12-12 NOTE — Progress Notes (Signed)
9 Days Post-Op    CC: Abdominal pain  Subjective: Started having some sweats yesterday, followed by a low-grade fever.  Blood cultures were obtained.  No results are available.  He notes the sweats seem to occur after he eats.  Still has a large volume output through his IR drain.  I explained his HIDA scan was negative and using pictures showed him the mechanics of his bile leak, and ERCP with stent placement.  GI has been called and were rechecking with them currently.  Overall he is fairly stable on physical exam.  He is currently n.p.o. and awaiting GI consult.  I have talked to Dr. Bosie Clos and he says Dr. Dulce Sellar will see today.  Objective: Vital signs in last 24 hours: Temp:  [98.6 F (37 C)-100.7 F (38.2 C)] 99 F (37.2 C) (11/05 0453) Pulse Rate:  [98-117] 108 (11/05 0740) Resp:  [18] 18 (11/05 0453) BP: (108-127)/(64-77) 127/77 (11/05 0740) SpO2:  [97 %-100 %] 97 % (11/05 0453) Weight:  [90.8 kg (200 lb 2.8 oz)-90.9 kg (200 lb 8 oz)] 90.8 kg (200 lb 2.8 oz) (11/05 0500) Last BM Date: 12/10/16 240 PO 575 IV Urine 1350 Drain 925 Afebrile, VSS NA 133, creatinine is normal WBC 15.1 H/H 9.6/30.5 HIDA postive   Intake/Output from previous day: 11/04 0701 - 11/05 0700 In: 815 [P.O.:240; I.V.:425; IV Piggyback:150] Out: 2275 [Urine:1350; Drains:925] Intake/Output this shift: No intake/output data recorded.  General appearance: alert, cooperative and no distress Resp: clear to auscultation bilaterally GI: soft, sore, ongoing high-volume drainage through the IR drain.  Port sites look fine.  Lab Results:  Recent Labs    12/11/16 0539 12/12/16 0645  WBC 13.8* 15.1*  HGB 8.6* 9.6*  HCT 27.7* 30.5*  PLT 685* 798*    BMET Recent Labs    12/11/16 0539 12/12/16 0645  NA 132* 133*  K 4.0 3.6  CL 99* 101  CO2 25 25  GLUCOSE 97 96  BUN 7 9  CREATININE 0.93 0.94  CALCIUM 8.0* 8.0*   PT/INR No results for input(s): LABPROT, INR in the last 72 hours.  Recent  Labs  Lab 12/06/16 0541 12/07/16 0611 12/10/16 0608 12/11/16 0539 12/12/16 0645  AST 24 22 18 21 20   ALT 89* 71* 38 32 28  ALKPHOS 117 134* 134* 138* 134*  BILITOT 1.0 1.1 1.2 1.0 1.0  PROT 5.2* 5.7* 6.2* 6.0* 6.6  ALBUMIN 2.3* 2.4* 2.2* 2.2* 2.4*     Lipase     Component Value Date/Time   LIPASE 21 11/25/2016 2301     Medications: . acetaminophen  1,000 mg Oral Q8H  . docusate sodium  100 mg Oral BID  . heparin subcutaneous  5,000 Units Subcutaneous Q8H  . metoprolol succinate  25 mg Oral Q breakfast  . multivitamins with iron  1 tablet Oral QPC supper  . pantoprazole  40 mg Oral Daily   . sodium chloride 100 mL/hr at 12/12/16 0509  . methocarbamol (ROBAXIN)  IV 500 mg (12/11/16 0515)  . piperacillin-tazobactam (ZOSYN)  IV 3.375 g (12/12/16 0503)   Anti-infectives (From admission, onward)   Start     Dose/Rate Route Frequency Ordered Stop   12/07/16 0830  piperacillin-tazobactam (ZOSYN) IVPB 3.375 g     3.375 g 12.5 mL/hr over 240 Minutes Intravenous Every 8 hours 12/07/16 0736     12/02/16 1500  cefTRIAXone (ROCEPHIN) 2 g in dextrose 5 % 50 mL IVPB  Status:  Discontinued     2 g 100  mL/hr over 30 Minutes Intravenous Every 24 hours 12/02/16 1337 12/07/16 0736       Assessment/Plan Gangrenous cholecystitis with cholelithiasis  Laparoscopic cholecystectomy, 19 French Blake drain placement, gangrenous changes involving the entirety of the back wall, short cystic duct precluded cholangiogram10/27/18, Dr. Violeta GelinasBurke Thompson  POD 6 Increased pain and distension =>> CT 10/31:  19.5 cm fluid collection - Biloma/abscess  17.4 cm possible hematoma IR drain Placed 12/08/16, 1050 In drain on return from the floor and 2035 ml total yesterday.    900 IR drain 11/2 845 IR drain 11/3 HIDA ordered 12/11/16 =>> Positive HIDA - Gi consult pending   Acute kidney injury:  Creatinine 1.07/0.97/1.42 12/04/16  =>> 0.83 today Hold lisinopril/increase Toprol, and decease fluids some.      Hyponatremia - restart IV fluids   Leukocytosis - Improving   Anemia - post op   restart heparin for DVT prophylaxis, - add MVI with FE tonight   RLL pleural effusion/atelectasis -  IS   Active problems: Hypertension - Toprol - lower dose, still tachycardic, good BP  Hx of Borderline CHF - Grade 2 diastolic dysfunction/EF 44% Hyperlipidemia History of thyroid disease FEN: IV fluids/soft NPO ID: Rocephin 10/26 - 10/31.  Zosyn 10/31 =>> day  6 DVT:  SCD's/  -restart heparin today and watch hemoglobin hematocrit closely. Foley: none Follow up: Dr Janee Mornhompson     Plan:  He needs ERCP and stent placement.  Dr. Dulce Sellarutlaw to see him today.  He is NPO, continue IV fluids and antibiotics.       LOS: 9 days    Shabrea Weldin 12/12/2016 [phone]

## 2016-12-12 NOTE — Consult Note (Signed)
[facility] Consultation [facility]  Referring Provider: No ref. provider found Primary Care Physician:  Jac Canavanysinger, David S, PA-C Primary Gastroenterologist:  None  Reason for Consultation:  Bile leak  HPI: Bobby Kelly is a 36 y.o. male whom we've been asked to see for bile leak. Had cholecystectomy for gangrenous calculous cholecystitis about one week ago; post-op developed fevers and escalating abdominal pain, imaging showed subhepatic fluid collection, drained percutaneously and appeared bilious; drain continues to put out upwards of one liter of bilious fluid daily over the past few days.  HIDA recently confirmed bile leak.  He has some lingering upper abdominal pain.   Past Medical History:  Diagnosis Date  . Allergy   . Dry skin    nasal folds  . Hypertension   . Thyroid disease    hyperactive in high school, had oral therapy.    . Wears contact lenses     Past Surgical History:  Procedure Laterality Date  . APPENDECTOMY      Prior to Admission medications   Medication Sig Start Date End Date Taking? Authorizing Provider  lisinopril (PRINIVIL,ZESTRIL) 20 MG tablet Take 1 tablet (20 mg total) by mouth daily. 10/17/16 01/15/17 Yes Camnitz, Will Daphine DeutscherMartin, MD  metoprolol succinate (TOPROL-XL) 50 MG 24 hr tablet Take 1 tablet (50 mg total) by mouth daily. Take with or immediately following a meal. 10/17/16  Yes Camnitz, Will Daphine DeutscherMartin, MD  ondansetron (ZOFRAN ODT) 4 MG disintegrating tablet Take 1 tablet (4 mg total) by mouth every 8 (eight) hours as needed for nausea or vomiting. 11/26/16  Yes Khatri, Hina, PA-C  oxyCODONE-acetaminophen (PERCOCET/ROXICET) 5-325 MG tablet Take 1 tablet by mouth every 8 (eight) hours as needed for severe pain. 11/26/16  Yes Khatri, Hina, PA-C  pravastatin (PRAVACHOL) 20 MG tablet Take 1 tablet (20 mg total) by mouth every evening. 11/07/16 11/07/17 Yes Tysinger, Kermit Baloavid S, PA-C    Current Facility-Administered Medications  Medication Dose Route Frequency  Provider Last Rate Last Dose  . acetaminophen (TYLENOL) tablet 1,000 mg  1,000 mg Oral Q8H Sherrie GeorgeJennings, Willard, PA-C   1,000 mg at 12/11/16 1545  . alum & mag hydroxide-simeth (MAALOX/MYLANTA) 200-200-20 MG/5ML suspension 30 mL  30 mL Oral Q4H PRN Violeta Gelinashompson, Burke, MD   30 mL at 12/07/16 1354  . dextrose 5 % and 0.9 % NaCl with KCl 20 mEq/L infusion   Intravenous Continuous Sherrie GeorgeJennings, Willard, PA-C 125 mL/hr at 12/12/16 [id number]    . docusate sodium (COLACE) capsule 100 mg  100 mg Oral BID Phylliss Blakesonnor, Chelsea A, MD   100 mg at 12/11/16 2039  . heparin injection 5,000 Units  5,000 Units Subcutaneous Q8H Charlott RakesSchooler, Vincent, MD      . HYDROmorphone (DILAUDID) injection 1 mg  1 mg Intravenous Q1H PRN Abigail MiyamotoBlackman, Douglas, MD   1 mg at 12/12/16 1149  . methocarbamol (ROBAXIN) 500 mg in dextrose 5 % 50 mL IVPB  500 mg Intravenous Q6H PRN Phylliss Blakesonnor, Chelsea A, MD 110 mL/hr at 12/11/16 0515 500 mg at 12/11/16 0515  . metoprolol succinate (TOPROL-XL) 24 hr tablet 25 mg  25 mg Oral Q breakfast Sherrie GeorgeJennings, Willard, PA-C   25 mg at 12/12/16 0740  . multivitamins with iron tablet 1 tablet  1 tablet Oral QPC supper Sherrie GeorgeJennings, Willard, PA-C   1 tablet at 12/11/16 1544  . ondansetron (ZOFRAN-ODT) disintegrating tablet 4 mg  4 mg Oral Q6H PRN Abigail MiyamotoBlackman, Douglas, MD       Or  . ondansetron [facility](ZOFRAN) injection 4 mg  4 mg Intravenous  Q6H PRN Abigail Miyamoto, MD   4 mg at 12/07/16 1354  . oxyCODONE (Oxy IR/ROXICODONE) immediate release tablet 5 mg  5 mg Oral Q4H PRN Phylliss Blakes A, MD   5 mg at 12/12/16 0740  . pantoprazole (PROTONIX) EC tablet 40 mg  40 mg Oral Daily Axel Filler, MD   40 mg at 12/11/16 1043  . piperacillin-tazobactam (ZOSYN) IVPB 3.375 g  3.375 g Intravenous Q8H Sherrie George, [place]   Stopped at 12/12/16 4132    Allergies as of 12/02/2016  . (No Known Allergies)    Family History  Problem Relation Age of Onset  . Hypertension Mother   . Cataracts Mother   . Hyperlipidemia Father   . Pancreatic cancer  Maternal Grandmother   . Cancer Maternal Grandmother   . Diabetes Other   . Heart disease Neg Hx   . Stroke Neg Hx     Social History   Socioeconomic History  . Marital status: Married    Spouse name: Not on file  . Number of children: Not on file  . Years of education: Not on file  . Highest education level: Not on file  Social Needs  . Financial resource strain: Not on file  . Food insecurity - worry: Not on file  . Food insecurity - inability: Not on file  . Transportation needs - medical: Not on file  . Transportation needs - non-medical: Not on file  Occupational History  . Not on file  Tobacco Use  . Smoking status: Never Smoker  . Smokeless tobacco: Never Used  Substance and Sexual Activity  . Alcohol use: Yes    Alcohol/week: 2.4 oz    Types: 3 Cans of beer, 1 Shots of liquor per week    Comment: occ, history of 2-3 years of heavier alcohol  . Drug use: No  . Sexual activity: Not on file  Other Topics Concern  . Not on file  Social History Narrative   Lives at home with wife and 1yo son. Son's birthday end of September.   Exercise - some jogging 2-3 times per week.    Considering joining gym.   10/2016    Review of Systems: As per HPI, all others negative  Physical Exam: Vital signs in last 24 hours: Temp:  [98.6 F (37 C)-99 F (37.2 C)] 99 F (37.2 C) (11/05 0453) Pulse Rate:  [98-108] 108 (11/05 0740) Resp:  [16-18] 16 (11/05 0839) BP: (108-127)/(64-77) 127/77 (11/05 0740) SpO2:  [97 %-100 %] 97 % (11/05 0453) Weight:  [200 lb 2.8 oz (90.8 kg)] 200 lb 2.8 oz (90.8 kg) (11/05 0500) Last BM Date: 12/10/16 General:   Alert,  Well-developed, well-nourished, pleasant and cooperative in NAD Head:  Normocephalic and atraumatic. Eyes:  Sclera clear, no icterus.   Conjunctiva pink. Ears:  Normal auditory acuity. Nose:  No deformity, discharge,  or lesions. Mouth:  No deformity or lesions.  Oropharynx pink & moist. Neck:  Supple; no masses or  thyromegaly. Lungs:  Clear throughout to auscultation.   No wheezes, crackles, or rhonchi. No acute distress. Heart:  Regular rate and rhythm; no murmurs, clicks, rubs,  or gallops. Abdomen:  Soft,protuberant, mild upper abdominal tenderness; RUQ perc drain with bilious fluid draining. No masses, hepatosplenomegaly or hernias noted. Hypoactive bowel sounds, mild guarding     Msk:  Symmetrical without gross deformities. Normal posture. Pulses:  Normal pulses noted. Extremities:  Without clubbing or edema. Neurologic:  Alert and  oriented x4; diffusely weak, otherwise grossly  normal neurologically. Skin:  Intact without significant lesions or rashes. Psych:  Alert and cooperative. Normal mood and affect.   Lab Results: Recent Labs    12/10/16 0608 12/11/16 0539 12/12/16 0645  WBC 16.0* 13.8* 15.1*  HGB 8.7* 8.6* 9.6*  HCT 28.2* 27.7* 30.5*  PLT 647* 685* 798*   BMET Recent Labs    12/10/16 0608 12/11/16 0539 12/12/16 0645  NA 134* 132* 133*  K 4.1 4.0 3.6  CL 103 99* 101  CO2 24 25 25   GLUCOSE 105* 97 96  BUN 7 7 9   CREATININE 0.88 0.93 0.94  CALCIUM 8.0* 8.0* 8.0*   LFT Recent Labs    12/12/16 0645  PROT 6.6  ALBUMIN 2.4*  AST 20  ALT 28  ALKPHOS 134*  BILITOT 1.0   PT/INR No results for input(s): LABPROT, INR in the last 72 hours.  Studies/Results: Nm Hepatobiliary Liver Func  Result Date: 12/11/2016 CLINICAL DATA:  Cholecystectomy, now of perihepatic fluid collection. EXAM: NUCLEAR MEDICINE HEPATOBILIARY IMAGING TECHNIQUE: Sequential images of the abdomen were obtained out to 60 minutes following intravenous administration of radiopharmaceutical. RADIOPHARMACEUTICALS:  5.6 mCi Tc-81m  Choletec IV COMPARISON:  CT scan 12/08/2016 FINDINGS: There is prompt uptake of radiopharmaceutical from the blood pool. Biliary tree and bowel activity noted at 5 minutes. At 5 minutes, and progressing over the next hour, there is filling of an abnormal large collection along the  right side of the liver, compatible with bile leak into the large fluid collection shown on CT scan. This is not shown to track down along the right paracolic gutter. IMPRESSION: 1. Bile leak into a large collection along the right side of the liver. Electronically Signed   By: Gaylyn Rong M.D.   On: 12/11/2016 14:25   Impression:  1.  Gangrenous calculous cholecystitis, ~ one week post-op. 2.  Abdominal pain with fevers, leukocytosis, and biloma with percutaneous drain placed. 3.  Post-operative bile leak, suspected with bile in perc drain and + HIDA scan.  Plan:  1.  ERCP with anticipated biliary sphincterotomy and bile duct stent placement tomorrow. 2.  Risks (up to and including bleeding, infection, perforation, pancreatitis that can be complicated by infected necrosis and death), benefits (removal of stones, alleviating blockage, decreasing risk of cholangitis or choledocholithiasis-related pancreatitis), and alternatives (watchful waiting, percutaneous transhepatic cholangiography) of ERCP were explained to patient/family in detail and patient elects to proceed. 3.  Continue antibiotics and supportive care. 4.  [facility] will follow.   LOS: 9 days   Velvia Mehrer M  12/12/2016, 1:04 PM  Cell [phone] If no answer or after 5 PM call [phone]

## 2016-12-12 NOTE — H&P (View-Only) (Signed)
[facility] Consultation [facility]  Referring Provider: No ref. provider found Primary Care Physician:  Jac Canavanysinger, David S, PA-C Primary Gastroenterologist:  None  Reason for Consultation:  Bile leak  HPI: Bobby Kelly is a 36 y.o. male whom we've been asked to see for bile leak. Had cholecystectomy for gangrenous calculous cholecystitis about one week ago; post-op developed fevers and escalating abdominal pain, imaging showed subhepatic fluid collection, drained percutaneously and appeared bilious; drain continues to put out upwards of one liter of bilious fluid daily over the past few days.  HIDA recently confirmed bile leak.  He has some lingering upper abdominal pain.   Past Medical History:  Diagnosis Date  . Allergy   . Dry skin    nasal folds  . Hypertension   . Thyroid disease    hyperactive in high school, had oral therapy.    . Wears contact lenses     Past Surgical History:  Procedure Laterality Date  . APPENDECTOMY      Prior to Admission medications   Medication Sig Start Date End Date Taking? Authorizing Provider  lisinopril (PRINIVIL,ZESTRIL) 20 MG tablet Take 1 tablet (20 mg total) by mouth daily. 10/17/16 01/15/17 Yes Camnitz, Will Daphine DeutscherMartin, MD  metoprolol succinate (TOPROL-XL) 50 MG 24 hr tablet Take 1 tablet (50 mg total) by mouth daily. Take with or immediately following a meal. 10/17/16  Yes Camnitz, Will Daphine DeutscherMartin, MD  ondansetron (ZOFRAN ODT) 4 MG disintegrating tablet Take 1 tablet (4 mg total) by mouth every 8 (eight) hours as needed for nausea or vomiting. 11/26/16  Yes Khatri, Hina, PA-C  oxyCODONE-acetaminophen (PERCOCET/ROXICET) 5-325 MG tablet Take 1 tablet by mouth every 8 (eight) hours as needed for severe pain. 11/26/16  Yes Khatri, Hina, PA-C  pravastatin (PRAVACHOL) 20 MG tablet Take 1 tablet (20 mg total) by mouth every evening. 11/07/16 11/07/17 Yes Tysinger, Kermit Baloavid S, PA-C    Current Facility-Administered Medications  Medication Dose Route Frequency  Provider Last Rate Last Dose  . acetaminophen (TYLENOL) tablet 1,000 mg  1,000 mg Oral Q8H Sherrie GeorgeJennings, Willard, PA-C   1,000 mg at 12/11/16 1545  . alum & mag hydroxide-simeth (MAALOX/MYLANTA) 200-200-20 MG/5ML suspension 30 mL  30 mL Oral Q4H PRN Violeta Gelinashompson, Burke, MD   30 mL at 12/07/16 1354  . dextrose 5 % and 0.9 % NaCl with KCl 20 mEq/L infusion   Intravenous Continuous Sherrie GeorgeJennings, Willard, PA-C 125 mL/hr at 12/12/16 [id number]    . docusate sodium (COLACE) capsule 100 mg  100 mg Oral BID Phylliss Blakesonnor, Chelsea A, MD   100 mg at 12/11/16 2039  . heparin injection 5,000 Units  5,000 Units Subcutaneous Q8H Charlott RakesSchooler, Vincent, MD      . HYDROmorphone (DILAUDID) injection 1 mg  1 mg Intravenous Q1H PRN Abigail MiyamotoBlackman, Douglas, MD   1 mg at 12/12/16 1149  . methocarbamol (ROBAXIN) 500 mg in dextrose 5 % 50 mL IVPB  500 mg Intravenous Q6H PRN Phylliss Blakesonnor, Chelsea A, MD 110 mL/hr at 12/11/16 0515 500 mg at 12/11/16 0515  . metoprolol succinate (TOPROL-XL) 24 hr tablet 25 mg  25 mg Oral Q breakfast Sherrie GeorgeJennings, Willard, PA-C   25 mg at 12/12/16 0740  . multivitamins with iron tablet 1 tablet  1 tablet Oral QPC supper Sherrie GeorgeJennings, Willard, PA-C   1 tablet at 12/11/16 1544  . ondansetron (ZOFRAN-ODT) disintegrating tablet 4 mg  4 mg Oral Q6H PRN Abigail MiyamotoBlackman, Douglas, MD       Or  . ondansetron [facility](ZOFRAN) injection 4 mg  4 mg Intravenous  Q6H PRN Abigail Miyamoto, MD   4 mg at 12/07/16 1354  . oxyCODONE (Oxy IR/ROXICODONE) immediate release tablet 5 mg  5 mg Oral Q4H PRN Phylliss Blakes A, MD   5 mg at 12/12/16 0740  . pantoprazole (PROTONIX) EC tablet 40 mg  40 mg Oral Daily Axel Filler, MD   40 mg at 12/11/16 1043  . piperacillin-tazobactam (ZOSYN) IVPB 3.375 g  3.375 g Intravenous Q8H Sherrie George, [place]   Stopped at 12/12/16 4132    Allergies as of 12/02/2016  . (No Known Allergies)    Family History  Problem Relation Age of Onset  . Hypertension Mother   . Cataracts Mother   . Hyperlipidemia Father   . Pancreatic cancer  Maternal Grandmother   . Cancer Maternal Grandmother   . Diabetes Other   . Heart disease Neg Hx   . Stroke Neg Hx     Social History   Socioeconomic History  . Marital status: Married    Spouse name: Not on file  . Number of children: Not on file  . Years of education: Not on file  . Highest education level: Not on file  Social Needs  . Financial resource strain: Not on file  . Food insecurity - worry: Not on file  . Food insecurity - inability: Not on file  . Transportation needs - medical: Not on file  . Transportation needs - non-medical: Not on file  Occupational History  . Not on file  Tobacco Use  . Smoking status: Never Smoker  . Smokeless tobacco: Never Used  Substance and Sexual Activity  . Alcohol use: Yes    Alcohol/week: 2.4 oz    Types: 3 Cans of beer, 1 Shots of liquor per week    Comment: occ, history of 2-3 years of heavier alcohol  . Drug use: No  . Sexual activity: Not on file  Other Topics Concern  . Not on file  Social History Narrative   Lives at home with wife and 1yo son. Son's birthday end of September.   Exercise - some jogging 2-3 times per week.    Considering joining gym.   10/2016    Review of Systems: As per HPI, all others negative  Physical Exam: Vital signs in last 24 hours: Temp:  [98.6 F (37 C)-99 F (37.2 C)] 99 F (37.2 C) (11/05 0453) Pulse Rate:  [98-108] 108 (11/05 0740) Resp:  [16-18] 16 (11/05 0839) BP: (108-127)/(64-77) 127/77 (11/05 0740) SpO2:  [97 %-100 %] 97 % (11/05 0453) Weight:  [200 lb 2.8 oz (90.8 kg)] 200 lb 2.8 oz (90.8 kg) (11/05 0500) Last BM Date: 12/10/16 General:   Alert,  Well-developed, well-nourished, pleasant and cooperative in NAD Head:  Normocephalic and atraumatic. Eyes:  Sclera clear, no icterus.   Conjunctiva pink. Ears:  Normal auditory acuity. Nose:  No deformity, discharge,  or lesions. Mouth:  No deformity or lesions.  Oropharynx pink & moist. Neck:  Supple; no masses or  thyromegaly. Lungs:  Clear throughout to auscultation.   No wheezes, crackles, or rhonchi. No acute distress. Heart:  Regular rate and rhythm; no murmurs, clicks, rubs,  or gallops. Abdomen:  Soft,protuberant, mild upper abdominal tenderness; RUQ perc drain with bilious fluid draining. No masses, hepatosplenomegaly or hernias noted. Hypoactive bowel sounds, mild guarding     Msk:  Symmetrical without gross deformities. Normal posture. Pulses:  Normal pulses noted. Extremities:  Without clubbing or edema. Neurologic:  Alert and  oriented x4; diffusely weak, otherwise grossly  normal neurologically. Skin:  Intact without significant lesions or rashes. Psych:  Alert and cooperative. Normal mood and affect.   Lab Results: Recent Labs    12/10/16 0608 12/11/16 0539 12/12/16 0645  WBC 16.0* 13.8* 15.1*  HGB 8.7* 8.6* 9.6*  HCT 28.2* 27.7* 30.5*  PLT 647* 685* 798*   BMET Recent Labs    12/10/16 0608 12/11/16 0539 12/12/16 0645  NA 134* 132* 133*  K 4.1 4.0 3.6  CL 103 99* 101  CO2 24 25 25   GLUCOSE 105* 97 96  BUN 7 7 9   CREATININE 0.88 0.93 0.94  CALCIUM 8.0* 8.0* 8.0*   LFT Recent Labs    12/12/16 0645  PROT 6.6  ALBUMIN 2.4*  AST 20  ALT 28  ALKPHOS 134*  BILITOT 1.0   PT/INR No results for input(s): LABPROT, INR in the last 72 hours.  Studies/Results: Nm Hepatobiliary Liver Func  Result Date: 12/11/2016 CLINICAL DATA:  Cholecystectomy, now of perihepatic fluid collection. EXAM: NUCLEAR MEDICINE HEPATOBILIARY IMAGING TECHNIQUE: Sequential images of the abdomen were obtained out to 60 minutes following intravenous administration of radiopharmaceutical. RADIOPHARMACEUTICALS:  5.6 mCi Tc-81m  Choletec IV COMPARISON:  CT scan 12/08/2016 FINDINGS: There is prompt uptake of radiopharmaceutical from the blood pool. Biliary tree and bowel activity noted at 5 minutes. At 5 minutes, and progressing over the next hour, there is filling of an abnormal large collection along the  right side of the liver, compatible with bile leak into the large fluid collection shown on CT scan. This is not shown to track down along the right paracolic gutter. IMPRESSION: 1. Bile leak into a large collection along the right side of the liver. Electronically Signed   By: Gaylyn Rong M.D.   On: 12/11/2016 14:25   Impression:  1.  Gangrenous calculous cholecystitis, ~ one week post-op. 2.  Abdominal pain with fevers, leukocytosis, and biloma with percutaneous drain placed. 3.  Post-operative bile leak, suspected with bile in perc drain and + HIDA scan.  Plan:  1.  ERCP with anticipated biliary sphincterotomy and bile duct stent placement tomorrow. 2.  Risks (up to and including bleeding, infection, perforation, pancreatitis that can be complicated by infected necrosis and death), benefits (removal of stones, alleviating blockage, decreasing risk of cholangitis or choledocholithiasis-related pancreatitis), and alternatives (watchful waiting, percutaneous transhepatic cholangiography) of ERCP were explained to patient/family in detail and patient elects to proceed. 3.  Continue antibiotics and supportive care. 4.  [facility] will follow.   LOS: 9 days   Jiayi Lengacher M  12/12/2016, 1:04 PM  Cell [phone] If no answer or after 5 PM call [phone]

## 2016-12-12 NOTE — Progress Notes (Signed)
Patient ID: Bobby Kelly, male   DOB: 03/12/1980, 36 y.o.   MRN: [id number]    Referring Physician(s): Dr. Abigail Miyamoto  Supervising Physician: Malachy Moan  Patient Status: [facility] - In-pt  Chief Complaint: biloma  Subjective: Patient still with some RUQ.  Drain draining well.  Plans for ERCP today.  Allergies: Patient has no known allergies.  Medications: Prior to Admission medications   Medication Sig Start Date End Date Taking? Authorizing Provider  lisinopril (PRINIVIL,ZESTRIL) 20 MG tablet Take 1 tablet (20 mg total) by mouth daily. 10/17/16 01/15/17 Yes Camnitz, Will Daphine Deutscher, MD  metoprolol succinate (TOPROL-XL) 50 MG 24 hr tablet Take 1 tablet (50 mg total) by mouth daily. Take with or immediately following a meal. 10/17/16  Yes Camnitz, Will Daphine Deutscher, MD  ondansetron (ZOFRAN ODT) 4 MG disintegrating tablet Take 1 tablet (4 mg total) by mouth every 8 (eight) hours as needed for nausea or vomiting. 11/26/16  Yes Khatri, Hina, PA-C  oxyCODONE-acetaminophen (PERCOCET/ROXICET) 5-325 MG tablet Take 1 tablet by mouth every 8 (eight) hours as needed for severe pain. 11/26/16  Yes Khatri, Hina, PA-C  pravastatin (PRAVACHOL) 20 MG tablet Take 1 tablet (20 mg total) by mouth every evening. 11/07/16 11/07/17 Yes Tysinger, Kermit Balo, PA-C    Vital Signs: BP 127/77   Pulse (!) 108   Temp 99 F (37.2 C) (Oral)   Resp 16   Ht 5\' 5"  (1.651 m)   Wt 200 lb 2.8 oz (90.8 kg)   SpO2 97%   BMI 33.31 kg/m   Physical Exam: Abd: soft, appropriately tender, drain in place with bilious output. 250cc of output.  Drain site is c/d/i  Imaging: Nm Hepatobiliary Liver Func  Result Date: 12/11/2016 CLINICAL DATA:  Cholecystectomy, now of perihepatic fluid collection. EXAM: NUCLEAR MEDICINE HEPATOBILIARY IMAGING TECHNIQUE: Sequential images of the abdomen were obtained out to 60 minutes following intravenous administration of radiopharmaceutical. RADIOPHARMACEUTICALS:  5.6 mCi Tc-77m  Choletec IV  COMPARISON:  CT scan 12/08/2016 FINDINGS: There is prompt uptake of radiopharmaceutical from the blood pool. Biliary tree and bowel activity noted at 5 minutes. At 5 minutes, and progressing over the next hour, there is filling of an abnormal large collection along the right side of the liver, compatible with bile leak into the large fluid collection shown on CT scan. This is not shown to track down along the right paracolic gutter. IMPRESSION: 1. Bile leak into a large collection along the right side of the liver. Electronically Signed   By: Gaylyn Rong M.D.   On: 12/11/2016 14:25   Ct Image Guided Fluid Drain By Catheter  Result Date: 12/08/2016 INDICATION: Perihepatic abscess EXAM: CT-GUIDED ABSCESS DRAIN MEDICATIONS: The patient is currently admitted to the hospital and receiving intravenous antibiotics. The antibiotics were administered within an appropriate time frame prior to the initiation of the procedure. ANESTHESIA/SEDATION: Fentanyl 50 mcg IV; Versed 1 mg IV Moderate Sedation Time:  13 The patient was continuously monitored during the procedure by the interventional radiology nurse under my direct supervision. COMPLICATIONS: None immediate. PROCEDURE: Informed written consent was obtained from the patient after a thorough discussion of the procedural risks, benefits and alternatives. All questions were addressed. Maximal Sterile Barrier Technique was utilized including caps, mask, sterile gowns, sterile gloves, sterile drape, hand hygiene and skin antiseptic. A timeout was performed prior to the initiation of the procedure. The right upper quadrant was prepped and draped in a sterile fashion. 1% lidocaine was utilized for local infiltration. Under CT guidance, an 18 gauge  needle was advanced into the perihepatic abscess and removed over an Amplatz wire. A 12 French dilator followed by a 12 [place] drain were inserted. It was looped and string fixed then sewn to the skin. Bilious fluid was  aspirated. FINDINGS: Images demonstrate placement of a 8112 French drain into a perihepatic abscess. IMPRESSION: Successful CT-guided perihepatic abscess drain yielding bilious fluid. Electronically Signed   By: Jolaine ClickArthur  Hoss M.D.   On: 12/08/2016 15:07    Labs:  CBC: Recent Labs    12/09/16 0334 12/10/16 0608 12/11/16 0539 12/12/16 0645  WBC 16.2* 16.0* 13.8* 15.1*  HGB 8.5* 8.7* 8.6* 9.6*  HCT 25.5* 28.2* 27.7* 30.5*  PLT 512* 647* 685* 798*    COAGS: Recent Labs    12/08/16 0747  INR 1.08    BMP: Recent Labs    12/08/16 0701 12/10/16 0608 12/11/16 0539 12/12/16 0645  NA 132* 134* 132* 133*  K 4.4 4.1 4.0 3.6  CL 100* 103 99* 101  CO2 24 24 25 25   GLUCOSE 103* 105* 97 96  BUN 9 7 7 9   CALCIUM 8.0* 8.0* 8.0* 8.0*  CREATININE 0.88 0.88 0.93 0.94  GFRNONAA >60 >60 >60 >60  GFRAA >60 >60 >60 >60    LIVER FUNCTION TESTS: Recent Labs    12/07/16 0611 12/10/16 0608 12/11/16 0539 12/12/16 0645  BILITOT 1.1 1.2 1.0 1.0  AST 22 18 21 20   ALT 71* 38 32 28  ALKPHOS 134* 134* 138* 134*  PROT 5.7* 6.2* 6.0* 6.6  ALBUMIN 2.4* 2.2* 2.2* 2.4*    Assessment and Plan: 1. S/p lap chole with biloma, s/p drain placement  Drain is functioning well.  Output should hopefully trend down after ERCP and stent placement today. Cont with drain until bile leak has resolved. Will follow.  Electronically Signed: Letha CapeSBORNE,Mirta Mally E 12/12/2016, 11:21 AM   I spent a total of 15 Minutes at the the patient's bedside AND on the patient's hospital floor or unit, greater than 50% of which was counseling/coordinating care for biloma

## 2016-12-13 ENCOUNTER — Encounter (HOSPITAL_COMMUNITY): Payer: Self-pay

## 2016-12-13 ENCOUNTER — Inpatient Hospital Stay (HOSPITAL_COMMUNITY): Payer: No Typology Code available for payment source | Admitting: Anesthesiology

## 2016-12-13 ENCOUNTER — Inpatient Hospital Stay (HOSPITAL_COMMUNITY): Payer: No Typology Code available for payment source

## 2016-12-13 ENCOUNTER — Encounter (HOSPITAL_COMMUNITY): Admission: AD | Disposition: A | Discharge status: Home / Self Care | Payer: Self-pay | Source: Ambulatory Visit

## 2016-12-13 HISTORY — PX: ESOPHAGOGASTRODUODENOSCOPY (EGD) WITH PROPOFOL: SHX5813

## 2016-12-13 LAB — AEROBIC/ANAEROBIC CULTURE W GRAM STAIN (SURGICAL/DEEP WOUND): Culture: NO GROWTH

## 2016-12-13 LAB — AEROBIC/ANAEROBIC CULTURE (SURGICAL/DEEP WOUND)

## 2016-12-13 SURGERY — ESOPHAGOGASTRODUODENOSCOPY (EGD) WITH PROPOFOL
Anesthesia: General

## 2016-12-13 MED ORDER — LACTATED RINGERS IV SOLN
INTRAVENOUS | Status: DC | PRN
  Administered 2016-12-13: 12:00:00 via INTRAVENOUS

## 2016-12-13 MED ORDER — DEXAMETHASONE SODIUM PHOSPHATE 10 MG/ML IJ SOLN
INTRAMUSCULAR | Status: DC | PRN
  Administered 2016-12-13: 10 mg via INTRAVENOUS

## 2016-12-13 MED ORDER — PHENOL 1.4 % MT LIQD
1.0000 | OROMUCOSAL | Status: DC | PRN
  Administered 2016-12-13 – 2016-12-15 (×2): 1 via OROMUCOSAL
  Filled 2016-12-13: qty 177

## 2016-12-13 MED ORDER — MIDAZOLAM HCL 5 MG/5ML IJ SOLN
INTRAMUSCULAR | Status: DC | PRN
  Administered 2016-12-13: 2 mg via INTRAVENOUS

## 2016-12-13 MED ORDER — KETOROLAC TROMETHAMINE 15 MG/ML IJ SOLN
15.0000 mg | Freq: Four times a day (QID) | INTRAMUSCULAR | Status: AC
  Administered 2016-12-13 – 2016-12-15 (×7): 15 mg via INTRAVENOUS
  Filled 2016-12-13 (×7): qty 1

## 2016-12-13 MED ORDER — SUGAMMADEX SODIUM 200 MG/2ML IV SOLN
INTRAVENOUS | Status: DC | PRN
  Administered 2016-12-13: 184.2 mg via INTRAVENOUS

## 2016-12-13 MED ORDER — ROCURONIUM BROMIDE 100 MG/10ML IV SOLN
INTRAVENOUS | Status: DC | PRN
  Administered 2016-12-13: 50 mg via INTRAVENOUS

## 2016-12-13 MED ORDER — LIDOCAINE HCL (CARDIAC) 20 MG/ML IV SOLN
INTRAVENOUS | Status: DC | PRN
  Administered 2016-12-13: 50 mg via INTRAVENOUS

## 2016-12-13 MED ORDER — ONDANSETRON HCL 4 MG/2ML IJ SOLN
INTRAMUSCULAR | Status: DC | PRN
  Administered 2016-12-13: 4 mg via INTRAVENOUS

## 2016-12-13 MED ORDER — GLUCAGON HCL RDNA (DIAGNOSTIC) 1 MG IJ SOLR
INTRAMUSCULAR | Status: AC
  Filled 2016-12-13: qty 1

## 2016-12-13 MED ORDER — PROPOFOL 10 MG/ML IV BOLUS
INTRAVENOUS | Status: DC | PRN
  Administered 2016-12-13: 150 mg via INTRAVENOUS

## 2016-12-13 MED ORDER — IOPAMIDOL (ISOVUE-300) INJECTION 61%
INTRAVENOUS | Status: AC
  Filled 2016-12-13: qty 50

## 2016-12-13 MED ORDER — SODIUM CHLORIDE 0.9 % IV SOLN: INTRAVENOUS | Status: DC

## 2016-12-13 MED ORDER — FENTANYL CITRATE (PF) 100 MCG/2ML IJ SOLN
INTRAMUSCULAR | Status: DC | PRN
  Administered 2016-12-13: 100 ug via INTRAVENOUS

## 2016-12-13 MED ORDER — INDOMETHACIN 50 MG RE SUPP
RECTAL | Status: AC
  Filled 2016-12-13: qty 2

## 2016-12-13 MED ORDER — HYDROMORPHONE HCL 1 MG/ML IJ SOLN
0.5000 mg | INTRAMUSCULAR | Status: DC | PRN
  Administered 2016-12-14 – 2016-12-19 (×11): 1 mg via INTRAVENOUS
  Administered 2016-12-20: 0.5 mg via INTRAVENOUS
  Administered 2016-12-20: 1 mg via INTRAVENOUS
  Filled 2016-12-13 (×13): qty 1

## 2016-12-13 NOTE — Anesthesia Preprocedure Evaluation (Addendum)
Anesthesia Evaluation  Patient identified by MRN, date of birth, ID band Patient awake    Reviewed: Allergy & Precautions, H&P , NPO status , Patient's Chart, lab work & pertinent test results  Airway Mallampati: III  TM Distance: >3 FB Neck ROM: Full    Dental no notable dental hx. (+) Teeth Intact, Dental Advisory Given   Pulmonary neg pulmonary ROS,    Pulmonary exam normal breath sounds clear to auscultation       Cardiovascular hypertension, Pt. on medications  Rhythm:Regular Rate:Normal     Neuro/Psych negative neurological ROS  negative psych ROS   GI/Hepatic negative GI ROS, Neg liver ROS,   Endo/Other  negative endocrine ROS  Renal/GU negative Renal ROS  negative genitourinary   Musculoskeletal   Abdominal   Peds  Hematology negative hematology ROS (+)   Anesthesia Other Findings   Reproductive/Obstetrics negative OB ROS                            Anesthesia Physical Anesthesia Plan  ASA: II  Anesthesia Plan: General   Post-op Pain Management:    Induction: Intravenous  PONV Risk Score and Plan: 3 and Ondansetron, Dexamethasone and Midazolam  Airway Management Planned: Oral ETT  Additional Equipment:   Intra-op Plan:   Post-operative Plan: Extubation in OR  Informed Consent: I have reviewed the patients History and Physical, chart, labs and discussed the procedure including the risks, benefits and alternatives for the proposed anesthesia with the patient or authorized representative who has indicated his/her understanding and acceptance.   Dental advisory given  Plan Discussed with: CRNA  Anesthesia Plan Comments:         Anesthesia Quick Evaluation

## 2016-12-13 NOTE — Anesthesia Postprocedure Evaluation (Signed)
Anesthesia Post Note  Patient: Bobby Kelly  Procedure(s) Performed: ENDOSCOPIC RETROGRADE CHOLANGIOPANCREATOGRAPHY (ERCP) (N/A )     Patient location during evaluation: PACU Anesthesia Type: General Level of consciousness: awake and alert Pain management: pain level controlled Vital Signs Assessment: post-procedure vital signs reviewed and stable Respiratory status: spontaneous breathing, nonlabored ventilation and respiratory function stable Cardiovascular status: blood pressure returned to baseline and stable Postop Assessment: no apparent nausea or vomiting Anesthetic complications: no    Last Vitals:  Vitals:   12/13/16 1340 12/13/16 1350  BP: 136/83 132/86  Pulse: (!) 103 (!) 103  Resp: 20 18  Temp:    SpO2: 93% 94%    Last Pain:  Vitals:   12/13/16 1327  TempSrc: Oral  PainSc:                  Lorea Kupfer,W. EDMOND

## 2016-12-13 NOTE — Progress Notes (Signed)
Long discussion with patient and his wife.  They are very distraught over all the post op complications.   They are upset that he was seen in the ER and sent home; then had to come back and have all these issues   They do not understand why he is having all these issues.  Why he could not get the ERCP, yesterday , why his stomach was full today.  I think they are just worn down with all the complications and wonder what could have been done to prevent it.  He feels like he is not getting better.  His wife is even asking if they can go to another hospital where he might get better care.  She is not sure he is a priority to some of the staff here. We discussed all of this and I told them that it was my opinion he has been seen and watched over very carefully.  We have been concerned with all his issues and addressed them as they occurred.  I will talk with Dr. Carolynne Edouard about this also. I have added Toradol for pain control, creatinine is normal, and decreased frequency of his dilaudid. Labs in AM, keeping him on clear liquid for now.

## 2016-12-13 NOTE — Transfer of Care (Signed)
Immediate Anesthesia Transfer of Care Note  Patient: Bobby Kelly  Procedure(s) Performed: ENDOSCOPIC RETROGRADE CHOLANGIOPANCREATOGRAPHY (ERCP) (N/A )  Patient Location: PACU  Anesthesia Type:General  Level of Consciousness: awake and alert   Airway & Oxygen Therapy: Patient Spontanous Breathing and Patient connected to nasal cannula oxygen  Post-op Assessment: Report given to RN and Post -op Vital signs reviewed and stable  Post vital signs: Reviewed and stable  Last Vitals:  Vitals:   12/13/16 1327 12/13/16 1330  BP: 139/80 131/87  Pulse: (!) 104 (!) 106  Resp: 18 11  Temp: 36.7 C   SpO2: 96% 93%    Last Pain:  Vitals:   12/13/16 1327  TempSrc: Oral  PainSc:       Patients Stated Pain Goal: 2 (12/12/16 1800)  Complications: No apparent anesthesia complications

## 2016-12-13 NOTE — Interval H&P Note (Signed)
History and Physical Interval Note:  12/13/2016 12:13 PM  Bobby Kelly  has presented today for surgery, with the diagnosis of bile leak  The various methods of treatment have been discussed with the patient and family. After consideration of risks, benefits and other options for treatment, the patient has consented to  Procedure(s): ENDOSCOPIC RETROGRADE CHOLANGIOPANCREATOGRAPHY (ERCP) (N/A) as a surgical intervention .  The patient's history has been reviewed, patient examined, no change in status, stable for surgery.  I have reviewed the patient's chart and labs.  Questions were answered to the patient's satisfaction.     [name]

## 2016-12-13 NOTE — Op Note (Signed)
[facility] Patient Name: Bobby HeadlandMarvel Denner Procedure Date : 12/13/2016 MRN: [id number] Attending MD: Willis ModenaWilliam Musa Rewerts , MD Date of Birth: [birth year] CSN: [id number] Age: [age] Admit Type: Inpatient Procedure:                Upper GI endoscopy Indications:              Bile leak (post-operative), elevated LFTs Providers:                Willis ModenaWilliam Chord Takahashi, MD, Dwain SarnaPatricia Ford, RN, Beryle BeamsJanie                            Billups, Technician, Gwenyth Allegraichard Adami CRNA, CRNA Referring MD:             [facility] Medicines:                General Anesthesia, Zosyn 3.375 grams 0700 today Complications:            No immediate complications. Estimated Blood Loss:     Estimated blood loss: none. Procedure:                Pre-Anesthesia Assessment:                           - Prior to the procedure, a History and Physical                            was performed, and patient medications and                            allergies were reviewed. The patient's tolerance of                            previous anesthesia was also reviewed. The risks                            and benefits of the procedure and the sedation                            options and risks were discussed with the patient.                            All questions were answered, and informed consent                            was obtained. Prior Anticoagulants: The patient has                            taken no previous anticoagulant or antiplatelet                            agents. ASA Grade Assessment: II - A patient with                            mild systemic disease. After reviewing the risks  and benefits, the patient was deemed in                            satisfactory condition to undergo the procedure.                           After obtaining informed consent, the endoscope was                            passed under direct vision. Throughout the                            procedure, the  patient's blood pressure, pulse, and                            oxygen saturations were monitored continuously. The                            [id number] scope was introduced through the                            mouth, and used to inject contrast into antrum of                            the stomach. After obtaining informed consent, the                            endoscope was passed under direct vision.                            Throughout the procedure, the patient's blood                            pressure, pulse, and oxygen saturations were                            monitored continuously.The upper GI endoscopy was                            technically difficult and complex due to presence                            of food. The patient tolerated the procedure well. Scope In: Scope Out: Findings:      A large amount of food (residue) was found in the gastric fundus, in the       gastric body and in the gastric antrum. Well over 50% of the stomach was       packed with food, looks like fibrous meats and vegetable matter. Much of       the food residue was around the antrum; despite changing patient's       position, suctioning, flushing, I could never locate the pylorus.       Procedure aborted. Impression:               - A large amount of  food (residue) in the stomach,                            precluding ability to intubate the duodenum to                            perform ERCP. Moderate Sedation:      None Recommendation:           - Return patient to hospital ward for ongoing care.                           - Retry ERCP in the next couple days, pending                            investigation into the cause of patient's large                            bolus of residue food in stomach.                           [facility] will follow. I discussed with surgical                            team.                           - Put patient on a clear liquid diet  starting today.                           - Continue present medications. Procedure Code(s):        --- Professional ---                           [phone], 52, Esophagogastroduodenoscopy, flexible,                            transoral; diagnostic, including collection of                            specimen(s) by brushing or washing, when performed                            (separate procedure) Diagnosis Code(s):        --- Professional ---                           R14.0, Abdominal distension (gaseous) CPT copyright 2016 [employer]. All rights reserved. The codes documented in this report are preliminary and upon coder review may  be revised to meet current compliance requirements. Willis Modena, MD 12/13/2016 1:28:08 PM This report has been signed electronically. Number of Addenda: 0

## 2016-12-13 NOTE — Progress Notes (Signed)
10 Days Post-Op    CC:  Abdominal pain    Subjective: Ambulating in the halls this a.m.  Complains of some gas.  Drainage is about the same.  No new issues or complaints.  Objective: Vital signs in last 24 hours: Temp:  [98.4 F (36.9 C)-98.6 F (37 C)] 98.6 F (37 C) (11/06 0443) Pulse Rate:  [97-103] 100 (11/06 0443) Resp:  [16] 16 (11/06 0443) BP: (119-127)/(62-80) 119/75 (11/06 0443) SpO2:  [97 %-99 %] 97 % (11/06 0443) Weight:  [92.2 kg (203 lb 4.2 oz)] 92.2 kg (203 lb 4.2 oz) (11/06 0559) Last BM Date: 12/11/16 840 PO 2493 IV 1300 urine 790 drain Afebrile, VSS WBC 15.1 CMP OK yesterday, recheck after ERCP tomorrow Intake/Output from previous day: 11/05 0701 - 11/06 0700 In: 3538.8 [P.O.:840; I.V.:2493.8; IV Piggyback:205] Out: 2090 [Urine:1300; Drains:790] Intake/Output this shift: No intake/output data recorded.  General appearance: alert, cooperative and no distress Resp: clear to auscultation bilaterally GI: sore, drainage still brown colored fluid. JP drain and IR drain look about he same.  Tolerating more PO, complains of gas this AM  Lab Results:  Recent Labs    12/11/16 0539 12/12/16 0645  WBC 13.8* 15.1*  HGB 8.6* 9.6*  HCT 27.7* 30.5*  PLT 685* 798*    BMET Recent Labs    12/11/16 0539 12/12/16 0645  NA 132* 133*  K 4.0 3.6  CL 99* 101  CO2 25 25  GLUCOSE 97 96  BUN 7 9  CREATININE 0.93 0.94  CALCIUM 8.0* 8.0*   PT/INR No results for input(s): LABPROT, INR in the last 72 hours.  Recent Labs  Lab 12/07/16 0611 12/10/16 0608 12/11/16 0539 12/12/16 0645  AST 22 18 21 20   ALT 71* 38 32 28  ALKPHOS 134* 134* 138* 134*  BILITOT 1.1 1.2 1.0 1.0  PROT 5.7* 6.2* 6.0* 6.6  ALBUMIN 2.4* 2.2* 2.2* 2.4*     Lipase     Component Value Date/Time   LIPASE 21 11/25/2016 2301     Medications: . acetaminophen  1,000 mg Oral Q8H  . docusate sodium  100 mg Oral BID  . heparin subcutaneous  5,000 Units Subcutaneous Q8H  . metoprolol  succinate  25 mg Oral Q breakfast  . multivitamins with iron  1 tablet Oral QPC supper  . pantoprazole  40 mg Oral Daily   . dextrose 5 % and 0.9 % NaCl with KCl 20 mEq/L 125 mL/hr at 12/13/16 0108  . methocarbamol (ROBAXIN)  IV Stopped (12/12/16 2200)  . piperacillin-tazobactam (ZOSYN)  IV 3.375 g (12/13/16 0441)   Anti-infectives (From admission, onward)   Start     Dose/Rate Route Frequency Ordered Stop   12/07/16 0830  piperacillin-tazobactam (ZOSYN) IVPB 3.375 g     3.375 g 12.5 mL/hr over 240 Minutes Intravenous Every 8 hours 12/07/16 0736     12/02/16 1500  cefTRIAXone (ROCEPHIN) 2 g in dextrose 5 % 50 mL IVPB  Status:  Discontinued     2 g 100 mL/hr over 30 Minutes Intravenous Every 24 hours 12/02/16 1337 12/07/16 0736      Assessment/Plan Gangrenous cholecystitis with cholelithiasis  Laparoscopic cholecystectomy, 19 French Blake drain placement, gangrenous changes involving the entirety of the back wall, short cystic duct precluded cholangiogram10/27/18, Dr. Violeta GelinasBurke Thompson POD10 Increased pain and distension =>>CT 10/31: 19.5 cm fluid collection - Biloma/abscess 17.4 cm possible hematoma IR drain Placed 12/08/16, 1050 In drain on return from the floor and 2035 ml total yesterday.  900  IR drain 11/2 845 IR drain 11/3 790 IR drain 11/5 HIDA ordered 12/11/16 =>> Positive HIDA -ERCP 12/13/16  Acute kidney injury: Creatinine 1.07/0.97/1.42 12/04/16 =>>0.83 today Hold lisinopril/increase Toprol, and decease fluids some.   Hyponatremia - restart IV fluids  Leukocytosis -Improving  Anemia - post oprestart heparin for DVT prophylaxis, - add MVI with FE   RLL pleural effusion/atelectasis - IS  Active problems: Hypertension - Toprol - lower dose, still tachycardic, good BP  Hx of Borderline CHF - Grade 2 diastolic dysfunction/EF 44% Hyperlipidemia History of thyroid disease FEN: IV fluids/soft NPO ID: Rocephin 10/26 -10/31. Zosyn 10/31 =>>day 7 DVT:  SCD's/ -restart heparin today and watch hemoglobin hematocrit closely. Foley: none Follow up: Dr Janee Morn   Plan:   ERCP today recheck labs tomorrow.           LOS: 10 days    Beckie Viscardi 12/13/2016 [phone]

## 2016-12-14 LAB — COMPREHENSIVE METABOLIC PANEL
ALBUMIN: 2.1 g/dL — AB (ref 3.5–5.0)
ALT: 25 U/L (ref 17–63)
ANION GAP: 4 — AB (ref 5–15)
AST: 22 U/L (ref 15–41)
Alkaline Phosphatase: 119 U/L (ref 38–126)
BUN: 7 mg/dL (ref 6–20)
CO2: 25 mmol/L (ref 22–32)
Calcium: 7.8 mg/dL — ABNORMAL LOW (ref 8.9–10.3)
Chloride: 107 mmol/L (ref 101–111)
Creatinine, Ser: 0.94 mg/dL (ref 0.61–1.24)
Glucose, Bld: 112 mg/dL — ABNORMAL HIGH (ref 65–99)
POTASSIUM: 4.2 mmol/L (ref 3.5–5.1)
Sodium: 136 mmol/L (ref 135–145)
TOTAL PROTEIN: 5.7 g/dL — AB (ref 6.5–8.1)
Total Bilirubin: 0.7 mg/dL (ref 0.3–1.2)

## 2016-12-14 LAB — CBC
HEMATOCRIT: 28.1 % — AB (ref 39.0–52.0)
Hemoglobin: 8.7 g/dL — ABNORMAL LOW (ref 13.0–17.0)
MCH: 28.6 pg (ref 26.0–34.0)
MCHC: 31 g/dL (ref 30.0–36.0)
MCV: 92.4 fL (ref 78.0–100.0)
Platelets: 754 10*3/uL — ABNORMAL HIGH (ref 150–400)
RBC: 3.04 MIL/uL — ABNORMAL LOW (ref 4.22–5.81)
RDW: 14.5 % (ref 11.5–15.5)
WBC: 15.3 10*3/uL — ABNORMAL HIGH (ref 4.0–10.5)

## 2016-12-14 LAB — LIPASE, BLOOD: Lipase: 35 U/L (ref 11–51)

## 2016-12-14 NOTE — Addendum Note (Signed)
Addendum  created 12/14/16 1220 by Gwenyth Allegra, CRNA   Sign clinical note

## 2016-12-14 NOTE — Progress Notes (Signed)
Subjective: Abdominal soreness improving. Drainage from JP drain improved overnight, but more again this morning.  Objective: Vital signs in last 24 hours: Temp:  [97.5 F (36.4 C)-99.3 F (37.4 C)] 99.1 F (37.3 C) (11/07 0521) Pulse Rate:  [93-123] 93 (11/07 0815) Resp:  [11-20] 18 (11/07 0521) BP: (102-139)/(62-94) 102/62 (11/07 0815) SpO2:  [93 %-100 %] 99 % (11/07 0521) Weight change: -4.2 oz (-0.12 kg) Last BM Date: 12/13/16  PE: GEN:  NAD  Lab Results: CBC    Component Value Date/Time   WBC 15.3 (H) 12/14/2016 0350   RBC 3.04 (L) 12/14/2016 0350   HGB 8.7 (L) 12/14/2016 0350   HCT 28.1 (L) 12/14/2016 0350   PLT 754 (H) 12/14/2016 0350   MCV 92.4 12/14/2016 0350   MCV 95.2 11/12/2012 1059   MCH 28.6 12/14/2016 0350   MCHC 31.0 12/14/2016 0350   RDW 14.5 12/14/2016 0350   LYMPHSABS 2,750 11/03/2016 0811   EOSABS 220 11/03/2016 0811   BASOSABS 55 11/03/2016 0811   CMP     Component Value Date/Time   NA 136 12/14/2016 0350   NA 137 11/01/2016 0832   K 4.2 12/14/2016 0350   CL 107 12/14/2016 0350   CO2 25 12/14/2016 0350   GLUCOSE 112 (H) 12/14/2016 0350   BUN 7 12/14/2016 0350   BUN 17 11/01/2016 0832   CREATININE 0.94 12/14/2016 0350   CREATININE 0.83 04/28/2016 1232   CALCIUM 7.8 (L) 12/14/2016 0350   PROT 5.7 (L) 12/14/2016 0350   ALBUMIN 2.1 (L) 12/14/2016 0350   AST 22 12/14/2016 0350   ALT 25 12/14/2016 0350   ALKPHOS 119 12/14/2016 0350   BILITOT 0.7 12/14/2016 0350   GFRNONAA >60 12/14/2016 0350   GFRAA >60 12/14/2016 0350   Assessment:  1.  Bile leak.  Couldn't do ERCP yesterday due to food in stomach. 2.  Elevated LFTs, now resolved. 3.  Abdominal pain, improving.  Plan:  1.  ERCP tentatively planned for 1100 am tomorrow; if drainage markedly improves over next 24 hours can cancel. 2.  Keep on clear liquids only today. 3.  [facility] will follow.   [name] 12/14/2016, 12:24 PM   Cell [phone] If no answer or after 5  PM call [phone]

## 2016-12-14 NOTE — Progress Notes (Signed)
1 Day Post-Op   Subjective/Chief Complaint: Complains of sore throat   Objective: Vital signs in last 24 hours: Temp:  [97.5 F (36.4 C)-99.3 F (37.4 C)] 99.1 F (37.3 C) (11/07 0521) Pulse Rate:  [93-123] 93 (11/07 0815) Resp:  [11-20] 18 (11/07 0521) BP: (102-139)/(62-94) 102/62 (11/07 0815) SpO2:  [93 %-100 %] 99 % (11/07 0521) Weight:  [92.1 kg (203 lb)] 92.1 kg (203 lb) (11/06 1131) Last BM Date: 12/13/16  Intake/Output from previous day: 11/06 0701 - 11/07 0700 In: 4509.6 [P.O.:320; I.V.:4089.6; IV Piggyback:100] Out: 3215 [Urine:2650; Drains:565] Intake/Output this shift: No intake/output data recorded.  General appearance: alert and cooperative Resp: clear to auscultation bilaterally Cardio: regular rate and rhythm GI: soft, mild tenderness. drain output bilious and decreasing slowly  Lab Results:  Recent Labs    12/12/16 0645 12/14/16 0350  WBC 15.1* 15.3*  HGB 9.6* 8.7*  HCT 30.5* 28.1*  PLT 798* 754*   BMET Recent Labs    12/12/16 0645 12/14/16 0350  NA 133* 136  K 3.6 4.2  CL 101 107  CO2 25 25  GLUCOSE 96 112*  BUN 9 7  CREATININE 0.94 0.94  CALCIUM 8.0* 7.8*   PT/INR No results for input(s): LABPROT, INR in the last 72 hours. ABG No results for input(s): PHART, HCO3 in the last 72 hours.  Invalid input(s): PCO2, PO2  Studies/Results: No results found.  Anti-infectives: Anti-infectives (From admission, onward)   Start     Dose/Rate Route Frequency Ordered Stop   12/07/16 0830  piperacillin-tazobactam (ZOSYN) IVPB 3.375 g     3.375 g 12.5 mL/hr over 240 Minutes Intravenous Every 8 hours 12/07/16 0736     12/02/16 1500  cefTRIAXone (ROCEPHIN) 2 g in dextrose 5 % 50 mL IVPB  Status:  Discontinued     2 g 100 mL/hr over 30 Minutes Intravenous Every 24 hours 12/02/16 1337 12/07/16 0736      Assessment/Plan: s/p Procedure(s): ESOPHAGOGASTRODUODENOSCOPY (EGD) WITH PROPOFOL (N/A) Continue clears  Continue abx and drain for bile  leak Consider retry of ercp in a few days if output persists   LOS: 11 days    TOTH III,PAUL S 12/14/2016

## 2016-12-14 NOTE — Progress Notes (Signed)
Patient ID: Bobby Kelly, male   DOB: 10-27-1980, 36 y.o.   MRN: [id number]    Referring Physician(s): Dr. Abigail Miyamoto  Supervising Physician: Jolaine Click  Patient Status: [facility] - In-pt  Chief Complaint: Biloma  Subjective: Soreness at drain site which is appropriate.  Possible ERCP tomorrow.   Allergies: Patient has no known allergies.  Medications: Prior to Admission medications   Medication Sig Start Date End Date Taking? Authorizing Provider  lisinopril (PRINIVIL,ZESTRIL) 20 MG tablet Take 1 tablet (20 mg total) by mouth daily. 10/17/16 01/15/17 Yes Camnitz, Will Daphine Deutscher, MD  metoprolol succinate (TOPROL-XL) 50 MG 24 hr tablet Take 1 tablet (50 mg total) by mouth daily. Take with or immediately following a meal. 10/17/16  Yes Camnitz, Will Daphine Deutscher, MD  ondansetron (ZOFRAN ODT) 4 MG disintegrating tablet Take 1 tablet (4 mg total) by mouth every 8 (eight) hours as needed for nausea or vomiting. 11/26/16  Yes Khatri, Hina, PA-C  oxyCODONE-acetaminophen (PERCOCET/ROXICET) 5-325 MG tablet Take 1 tablet by mouth every 8 (eight) hours as needed for severe pain. 11/26/16  Yes Khatri, Hina, PA-C  pravastatin (PRAVACHOL) 20 MG tablet Take 1 tablet (20 mg total) by mouth every evening. 11/07/16 11/07/17 Yes Tysinger, Kermit Balo, PA-C    Vital Signs: BP 102/62   Pulse 93   Temp 99.1 F (37.3 C) (Oral)   Resp 18   Ht 5\' 5"  (1.651 m)   Wt 203 lb (92.1 kg)   SpO2 99%   BMI 33.78 kg/m   Physical Exam: Abd: Soft, sore. Drains in place with bilious output; 145 mL recorded so far today, more in bulb/bag.  Drain sites is c/d/i  Imaging: Nm Hepatobiliary Liver Func  Result Date: 12/11/2016 CLINICAL DATA:  Cholecystectomy, now of perihepatic fluid collection. EXAM: NUCLEAR MEDICINE HEPATOBILIARY IMAGING TECHNIQUE: Sequential images of the abdomen were obtained out to 60 minutes following intravenous administration of radiopharmaceutical. RADIOPHARMACEUTICALS:  5.6 mCi Tc-64m  Choletec IV  COMPARISON:  CT scan 12/08/2016 FINDINGS: There is prompt uptake of radiopharmaceutical from the blood pool. Biliary tree and bowel activity noted at 5 minutes. At 5 minutes, and progressing over the next hour, there is filling of an abnormal large collection along the right side of the liver, compatible with bile leak into the large fluid collection shown on CT scan. This is not shown to track down along the right paracolic gutter. IMPRESSION: 1. Bile leak into a large collection along the right side of the liver. Electronically Signed   By: Gaylyn Rong M.D.   On: 12/11/2016 14:25    Labs:  CBC: Recent Labs    12/10/16 0608 12/11/16 0539 12/12/16 0645 12/14/16 0350  WBC 16.0* 13.8* 15.1* 15.3*  HGB 8.7* 8.6* 9.6* 8.7*  HCT 28.2* 27.7* 30.5* 28.1*  PLT 647* 685* 798* 754*    COAGS: Recent Labs    12/08/16 0747  INR 1.08    BMP: Recent Labs    12/10/16 0608 12/11/16 0539 12/12/16 0645 12/14/16 0350  NA 134* 132* 133* 136  K 4.1 4.0 3.6 4.2  CL 103 99* 101 107  CO2 24 25 25 25   GLUCOSE 105* 97 96 112*  BUN 7 7 9 7   CALCIUM 8.0* 8.0* 8.0* 7.8*  CREATININE 0.88 0.93 0.94 0.94  GFRNONAA >60 >60 >60 >60  GFRAA >60 >60 >60 >60    LIVER FUNCTION TESTS: Recent Labs    12/10/16 0608 12/11/16 0539 12/12/16 0645 12/14/16 0350  BILITOT 1.2 1.0 1.0 0.7  AST 18 21  20 22  ALT 38 32 28 25  ALKPHOS 134* 138* 134* 119  PROT 6.2* 6.0* 6.6 5.7*  ALBUMIN 2.2* 2.2* 2.4* 2.1*    Assessment and Plan: Lap chole with biloma, s/p drain placement Drain remains in place; 565 mL output yesterday, 145 mL so far today with more in collecting bags.  Tentatively scheduled for ERCP tomorrow if continues to have increased output.  WBC remains elevated at 15K.  IR to follow.   Electronically Signed: Hoyt KochKacie Sue-Ellen Rionna Feltes 12/14/2016, 2:14 PM   I spent a total of 15 Minutes at the the patient's bedside AND on the patient's hospital floor or unit, greater than 50% of which was  counseling/coordinating care for biloma

## 2016-12-14 NOTE — Anesthesia Postprocedure Evaluation (Signed)
Anesthesia Post Note  Patient: Bobby Kelly  Procedure(s) Performed: ESOPHAGOGASTRODUODENOSCOPY (EGD) WITH PROPOFOL (N/A )     Anesthesia Post Evaluation  Last Vitals:  Vitals:   12/14/16 0521 12/14/16 0815  BP: 120/71 102/62  Pulse: 99 93  Resp: 18   Temp: 37.3 C   SpO2: 99%     Last Pain:  Vitals:   12/14/16 0553  TempSrc:   PainSc: 0-No pain                 Albert Hersch

## 2016-12-14 NOTE — Addendum Note (Signed)
Addendum  created 12/14/16 1413 by Gwenyth Allegra, CRNA   Intraprocedure Staff edited

## 2016-12-15 ENCOUNTER — Encounter (HOSPITAL_COMMUNITY): Payer: Self-pay | Admitting: Anesthesiology

## 2016-12-15 ENCOUNTER — Inpatient Hospital Stay (HOSPITAL_COMMUNITY): Payer: No Typology Code available for payment source | Admitting: Anesthesiology

## 2016-12-15 ENCOUNTER — Encounter (HOSPITAL_COMMUNITY): Admission: AD | Disposition: A | Discharge status: Home / Self Care | Payer: Self-pay | Source: Ambulatory Visit

## 2016-12-15 HISTORY — PX: ESOPHAGOGASTRODUODENOSCOPY (EGD) WITH PROPOFOL: SHX5813

## 2016-12-15 SURGERY — ESOPHAGOGASTRODUODENOSCOPY (EGD) WITH PROPOFOL
Anesthesia: Monitor Anesthesia Care

## 2016-12-15 MED ORDER — ONDANSETRON HCL 4 MG/2ML IJ SOLN
INTRAMUSCULAR | Status: DC | PRN
  Administered 2016-12-15: 4 mg via INTRAVENOUS

## 2016-12-15 MED ORDER — BUTAMBEN-TETRACAINE-BENZOCAINE 2-2-14 % EX AERO
INHALATION_SPRAY | CUTANEOUS | Status: DC | PRN
  Administered 2016-12-15: 2 via TOPICAL

## 2016-12-15 MED ORDER — GLUCAGON HCL RDNA (DIAGNOSTIC) 1 MG IJ SOLR
INTRAMUSCULAR | Status: AC
  Filled 2016-12-15: qty 1

## 2016-12-15 MED ORDER — INDOMETHACIN 50 MG RE SUPP
RECTAL | Status: AC
  Filled 2016-12-15: qty 2

## 2016-12-15 MED ORDER — IOPAMIDOL (ISOVUE-300) INJECTION 61%
INTRAVENOUS | Status: AC
  Filled 2016-12-15: qty 50

## 2016-12-15 MED ORDER — LACTATED RINGERS IV SOLN
INTRAVENOUS | Status: AC | PRN
  Administered 2016-12-15: 1000 mL via INTRAVENOUS

## 2016-12-15 MED ORDER — PROPOFOL 500 MG/50ML IV EMUL
INTRAVENOUS | Status: DC | PRN
  Administered 2016-12-15: 100 ug/kg/min via INTRAVENOUS

## 2016-12-15 MED ORDER — PROPOFOL 10 MG/ML IV BOLUS
INTRAVENOUS | Status: DC | PRN
  Administered 2016-12-15 (×3): 20 mg via INTRAVENOUS

## 2016-12-15 NOTE — Transfer of Care (Signed)
Immediate Anesthesia Transfer of Care Note  Patient: Bobby Kelly  Procedure(s) Performed: ESOPHAGOGASTRODUODENOSCOPY (EGD) WITH PROPOFOL (N/A )  Patient Location: PACU  Anesthesia Type:MAC  Level of Consciousness: awake, oriented and patient cooperative  Airway & Oxygen Therapy: Patient Spontanous Breathing and Patient connected to nasal cannula oxygen  Post-op Assessment: Report given to RN and Post -op Vital signs reviewed and stable  Post vital signs: Reviewed  Last Vitals:  Vitals:   12/15/16 1019 12/15/16 1153  BP: 119/71 123/73  Pulse:  95  Resp: (!) 22 (!) 26  Temp: 36.6 C   SpO2: 99% 99%    Last Pain:  Vitals:   12/15/16 1153  TempSrc:   PainSc: 4       Patients Stated Pain Goal: 2 (29/47/65 4650)  Complications: No apparent anesthesia complications

## 2016-12-15 NOTE — Anesthesia Postprocedure Evaluation (Signed)
Anesthesia Post Note  Patient: Bobby Kelly  Procedure(s) Performed: ESOPHAGOGASTRODUODENOSCOPY (EGD) WITH PROPOFOL (N/A )     Patient location during evaluation: PACU Anesthesia Type: MAC Level of consciousness: awake and alert Pain management: pain level controlled Vital Signs Assessment: post-procedure vital signs reviewed and stable Respiratory status: spontaneous breathing and respiratory function stable Cardiovascular status: stable Postop Assessment: no apparent nausea or vomiting Anesthetic complications: no    Last Vitals:  Vitals:   12/15/16 1210 12/15/16 1218  BP:  (!) 145/76  Pulse: (!) 109 (!) 106  Resp:    Temp:    SpO2: 98% 98%    Last Pain:  Vitals:   12/15/16 1153  TempSrc:   PainSc: 4                  Shakara Tweedy DANIEL

## 2016-12-15 NOTE — Anesthesia Preprocedure Evaluation (Addendum)
Anesthesia Evaluation  Patient identified by MRN, date of birth, ID band Patient awake    Reviewed: Allergy & Precautions, NPO status , Patient's Chart, lab work & pertinent test results, reviewed documented beta blocker date and time   History of Anesthesia Complications Negative for: history of anesthetic complications  Airway Mallampati: II  TM Distance: >3 FB Neck ROM: Full    Dental no notable dental hx. (+) Dental Advisory Given, Teeth Intact   Pulmonary neg pulmonary ROS,    Pulmonary exam normal breath sounds clear to auscultation       Cardiovascular hypertension, Pt. on medications and Pt. on home beta blockers negative cardio ROS Normal cardiovascular exam Rhythm:Regular Rate:Normal   Nuclear stress EF: 44%.  No significant change in nonspecific ST abnormality during infusion.  There is a small defect of mild severity present in the basal inferior and mid inferior location. The defect is partially reversible and consistent with infarct with peri infarct ischemia vs. Variations in diaphragmatic attenuation artifact.  The left ventricular ejection fraction is moderately decreased (30-44%).  This is an intermediate risk study.  Recommend 2D echo for further assessment of LVF and inferior wall motion.     Neuro/Psych negative neurological ROS  negative psych ROS   GI/Hepatic negative GI ROS, Neg liver ROS,   Endo/Other  negative endocrine ROS  Renal/GU negative Renal ROS  negative genitourinary   Musculoskeletal negative musculoskeletal ROS (+)   Abdominal   Peds negative pediatric ROS (+)  Hematology negative hematology ROS (+)   Anesthesia Other Findings   Reproductive/Obstetrics negative OB ROS                            Anesthesia Physical Anesthesia Plan  ASA: III  Anesthesia Plan: MAC   Post-op Pain Management:    Induction: Intravenous  PONV Risk Score and  Plan: Ondansetron and Dexamethasone  Airway Management Planned: Natural Airway  Additional Equipment:   Intra-op Plan:   Post-operative Plan:   Informed Consent: I have reviewed the patients History and Physical, chart, labs and discussed the procedure including the risks, benefits and alternatives for the proposed anesthesia with the patient or authorized representative who has indicated his/her understanding and acceptance.   Dental advisory given  Plan Discussed with: CRNA, Anesthesiologist and Surgeon  Anesthesia Plan Comments:        Anesthesia Quick Evaluation

## 2016-12-15 NOTE — Progress Notes (Signed)
Day of Surgery    CC:  Abdominal pain  Subjective: He is very frustrated and angry, but trying to hold it together.  Unable to do Stent/ERCP again today.   Abdomen is stable,,Drain is dark bilious fluid.    Objective: Vital signs in last 24 hours: Temp:  [97.8 F (36.6 C)-98.7 F (37.1 C)] 97.8 F (36.6 C) (11/08 1019) Pulse Rate:  [90-95] 95 (11/08 1153) Resp:  [16-26] 26 (11/08 1153) BP: (117-133)/(71-79) 123/73 (11/08 1153) SpO2:  [99 %-100 %] 99 % (11/08 1153) Weight:  [94.3 kg (208 lb)-94.4 kg (208 lb 1.6 oz)] 94.3 kg (208 lb) (11/08 1019) Last BM Date: 12/14/16 500 PO 2400 IV 2340 urine 1415 IR drain Stool x 2   Afebrile, VSS No labs today  Intake/Output from previous day: 11/07 0701 - 11/08 0700 In: 2946.7 [P.O.:500; I.V.:2246.7; IV Piggyback:200] Out: 3755 [Urine:2340; Drains:1415] Intake/Output this shift: Total I/O In: 200 [I.V.:200] Out: 175 [Drains:175]  General appearance: alert, cooperative and no distress Resp: clear to auscultation bilaterally GI: soft, still somewhat distended.  drain showing dark brown clear fluid.    Lab Results:  Recent Labs    12/14/16 0350  WBC 15.3*  HGB 8.7*  HCT 28.1*  PLT 754*    BMET Recent Labs    12/14/16 0350  NA 136  K 4.2  CL 107  CO2 25  GLUCOSE 112*  BUN 7  CREATININE 0.94  CALCIUM 7.8*   PT/INR No results for input(s): LABPROT, INR in the last 72 hours.  Recent Labs  Lab 12/10/16 0608 12/11/16 0539 12/12/16 0645 12/14/16 0350  AST 18 21 20 22   ALT 38 32 28 25  ALKPHOS 134* 138* 134* 119  BILITOT 1.2 1.0 1.0 0.7  PROT 6.2* 6.0* 6.6 5.7*  ALBUMIN 2.2* 2.2* 2.4* 2.1*     Lipase     Component Value Date/Time   LIPASE 35 12/14/2016 0350     Medications: . [MAR Hold] acetaminophen  1,000 mg Oral Q8H  . [MAR Hold] docusate sodium  100 mg Oral BID  . [MAR Hold] heparin subcutaneous  5,000 Units Subcutaneous Q8H  . [MAR Hold] ketorolac  15 mg Intravenous Q6H  . [MAR Hold] metoprolol  succinate  25 mg Oral Q breakfast  . [MAR Hold] multivitamins with iron  1 tablet Oral QPC supper  . [MAR Hold] pantoprazole  40 mg Oral Daily   . dextrose 5 % and 0.9 % NaCl with KCl 20 mEq/L 100 mL/hr at 12/14/16 2240  . [MAR Hold] methocarbamol (ROBAXIN)  IV Stopped (12/12/16 2200)  . [MAR Hold] piperacillin-tazobactam (ZOSYN)  IV 3.375 g (12/15/16 0557)    Assessment/Plan Gangrenous cholecystitis with cholelithiasis  Laparoscopic cholecystectomy, 19 French Blake drain placement, gangrenous changes involving the entirety of the back wall, short cystic duct precluded cholangiogram10/27/18, Dr. Violeta GelinasBurke Thompson POD10 Increased pain and distension =>>CT 10/31: 19.5 cm fluid collection - Biloma/abscess 17.4 cm possible hematoma IR drain Placed 12/08/16, 1050 In drain on return from the floor and 2035 ml total yesterday.  900 IR drain 11/2 845 IR drain 11/3 790 IR drain 11/5 HIDA ordered11/4/18 =>> Positive HIDA -ERCP 12/13/16  Acute kidney injury: Creatinine 1.07/0.97/1.42 12/04/16 =>>0.83 today Hold lisinopril/increase Toprol, and decease fluids some.   Hyponatremia - restart IV fluids  Leukocytosis -Improving  Anemia - post oprestart heparin for DVT prophylaxis, - add MVI with FE   RLL pleural effusion/atelectasis - IS  Active problems: Hypertension - Toprol - lower dose, still tachycardic, good BP  Hx of Borderline CHF - Grade 2 diastolic dysfunction/EF 44% Hyperlipidemia History of thyroid disease FEN: IV fluids/restart clears and go to fulls if he tolerates them.  ID: Rocephin 10/26 -10/31. Zosyn 10/31 =>>day7 DVT: SCD's/ -restart heparin today and watch hemoglobin hematocrit closely. Foley: none Follow up: Dr Janee Morn  Plan: Second attempt at ERCP failed. Pt airway swollen and Dr. Dulce Sellar not comfortable with doing procedure.  He wants to know what the plan is now, he is not happy about waiting a week or more before making any further  progress.            LOS: 12 days    Bobby Kelly 12/15/2016 [phone]

## 2016-12-15 NOTE — Op Note (Signed)
[facility] Patient Name: Bobby Kelly Procedure Date : 12/15/2016 MRN: [id number] Attending MD: Willis Modena , MD Date of Birth: March 04, 1980 CSN: [id number] Age: 36 Admit Type: Inpatient Procedure:                Upper GI endoscopy Indications:              bile leak Providers:                Willis Modena, MD, Norman Clay, RN, Zoila Shutter,                            Technician Referring MD:              Medicines:                Monitored Anesthesia Care Complications:            No immediate complications. Estimated Blood Loss:     Estimated blood loss: none. Procedure:                Pre-Anesthesia Assessment:                           - Prior to the procedure, a History and Physical                            was performed, and patient medications and                            allergies were reviewed. The patient's tolerance of                            previous anesthesia was also reviewed. The risks                            and benefits of the procedure and the sedation                            options and risks were discussed with the patient.                            All questions were answered, and informed consent                            was obtained. Prior Anticoagulants: The patient has                            taken no previous anticoagulant or antiplatelet                            agents. ASA Grade Assessment: III - A patient with                            severe systemic disease. After reviewing the risks  and benefits, the patient was deemed in                            satisfactory condition to undergo the procedure.                           After obtaining informed consent, the endoscope was                            passed under direct vision. Throughout the                            procedure, the patient's blood pressure, pulse, and                            oxygen saturations were monitored  continuously. The                            Duodenoscope was introduced through the mouth, and                            used to inject contrast into second part of                            duodenum. After obtaining informed consent, the                            endoscope was passed under direct vision.                            Throughout the procedure, the patient's blood                            pressure, pulse, and oxygen saturations were                            monitored continuously.The upper GI endoscopy was                            accomplished without difficulty. The patient                            tolerated the procedure well. Scope In: Scope Out: Findings:      Diffuse moderate mucosal changes characterized by congestion were found       at the cricopharyngeus region.      The exam of the esophagus was otherwise normal.      Mild inflammation was found in the gastric fundus.      The exam of the stomach was otherwise normal.      The duodenal bulb, first portion of the duodenum and second portion of       the duodenum were normal. Impression:               - Congested mucosa in the esophagus.                           -  Gastritis.                           - Normal duodenal bulb, first portion of the                            duodenum and second portion of the duodenum. Moderate Sedation:      None Recommendation:           - Return patient to hospital ward for ongoing care.                           - Soft diet.                           - Patient, pre-procedure, was noting some globus                            sensation and spitting up saliva from time to time.                            The edema findings in the oropharynx are likely                            from prior procedure, and I felt that doing general                            anesthesia again so soon in a non-urgent situation,                            with passage of both endotracheal  tube and then the                            larger duodenoscope would have chance to cause                            potential respiratory issues. As a result, I would                            elect holding off on general anesthsia and ERCP,                            send patient home with biliary drain, and then                            repeat ERCP for biliary stent placement as                            outpatient in 1-2 weeks (if patient continues to                            have drainage from JP tube).                           -  The findings and recommendations were discussed                            directly with the surgical team.                           - Continue present medications. Procedure Code(s):        --- Professional ---                           [phone], Esophagogastroduodenoscopy, flexible,                            transoral; diagnostic, including collection of                            specimen(s) by brushing or washing, when performed                            (separate procedure) Diagnosis Code(s):        --- Professional ---                           K22.8, Other specified diseases of esophagus                           K29.70, Gastritis, unspecified, without bleeding                           K91.89, Other postprocedural complications and                            disorders of digestive system CPT copyright 2016 [employer]. All rights reserved. The codes documented in this report are preliminary and upon coder review may  be revised to meet current compliance requirements. Willis Modena, MD 12/15/2016 11:59:14 AM This report has been signed electronically. Number of Addenda: 0

## 2016-12-15 NOTE — Interval H&P Note (Signed)
History and Physical Interval Note:  12/15/2016 10:45 AM  Bobby Kelly  has presented today for surgery, with the diagnosis of bile leak  The various methods of treatment have been discussed with the patient and family. After consideration of risks, benefits and other options for treatment, the patient has consented to  Procedure(s): ENDOSCOPIC RETROGRADE CHOLANGIOPANCREATOGRAPHY (ERCP) (N/A) as a surgical intervention .  The patient's history has been reviewed, patient examined, no change in status, stable for surgery.  I have reviewed the patient's chart and labs.  Questions were answered to the patient's satisfaction.     Dishawn Bhargava M  Assessment:  1.  Bile leak, post-operative.  Plan:  1.  Endoscopic retrograde cholangiopancreatography with anticipated bile duct stent placement. 2.  Risks (up to and including bleeding, infection, perforation, pancreatitis that can be complicated by infected necrosis and death), benefits (removal of stones, alleviating blockage, decreasing risk of cholangitis or choledocholithiasis-related pancreatitis), and alternatives (watchful waiting, percutaneous transhepatic cholangiography) of ERCP were explained to patient/family in detail and patient elects to proceed.

## 2016-12-15 NOTE — Anesthesia Procedure Notes (Signed)
Procedure Name: MAC Date/Time: 12/15/2016 11:30 AM Performed by: Jenne Campus, CRNA Pre-anesthesia Checklist: Patient identified, Emergency Drugs available, Suction available, Patient being monitored and Timeout performed Oxygen Delivery Method: Nasal cannula

## 2016-12-16 ENCOUNTER — Encounter (HOSPITAL_COMMUNITY): Payer: Self-pay | Admitting: Gastroenterology

## 2016-12-16 LAB — CULTURE, BLOOD (ROUTINE X 2)
CULTURE: NO GROWTH
CULTURE: NO GROWTH
SPECIAL REQUESTS: ADEQUATE
SPECIAL REQUESTS: ADEQUATE

## 2016-12-16 NOTE — Progress Notes (Signed)
Patient ID: Bobby Kelly, male   DOB: October 25, 1980, 36 y.o.   MRN: [id number]    Referring Physician(s): Dr. Abigail Miyamoto  Supervising Physician: Simonne Come  Patient Status: [facility] - In-pt  Chief Complaint: Biloma  Subjective: Soreness at drain site which is appropriate. Has pain medication ordered.  Upper endo yesterday.  Allergies: Patient has no known allergies.  Medications: Prior to Admission medications   Medication Sig Start Date End Date Taking? Authorizing Provider  lisinopril (PRINIVIL,ZESTRIL) 20 MG tablet Take 1 tablet (20 mg total) by mouth daily. 10/17/16 01/15/17 Yes Camnitz, Will Daphine Deutscher, MD  metoprolol succinate (TOPROL-XL) 50 MG 24 hr tablet Take 1 tablet (50 mg total) by mouth daily. Take with or immediately following a meal. 10/17/16  Yes Camnitz, Will Daphine Deutscher, MD  ondansetron (ZOFRAN ODT) 4 MG disintegrating tablet Take 1 tablet (4 mg total) by mouth every 8 (eight) hours as needed for nausea or vomiting. 11/26/16  Yes Khatri, Hina, PA-C  oxyCODONE-acetaminophen (PERCOCET/ROXICET) 5-325 MG tablet Take 1 tablet by mouth every 8 (eight) hours as needed for severe pain. 11/26/16  Yes Khatri, Hina, PA-C  pravastatin (PRAVACHOL) 20 MG tablet Take 1 tablet (20 mg total) by mouth every evening. 11/07/16 11/07/17 Yes Tysinger, Kermit Balo, PA-C    Vital Signs: BP 117/76   Pulse 96   Temp 98.6 F (37 C) (Oral)   Resp 16   Ht 5\' 5"  (1.651 m)   Wt 195 lb 8 oz (88.7 kg)   SpO2 96%   BMI 32.53 kg/m   Physical Exam: Abd: Soft, sore. Drains in place with bilious output; 325 mL total for the day.  Drain sites is c/d/i  Imaging: No results found.  Labs:  CBC: Recent Labs    12/10/16 0608 12/11/16 0539 12/12/16 0645 12/14/16 0350  WBC 16.0* 13.8* 15.1* 15.3*  HGB 8.7* 8.6* 9.6* 8.7*  HCT 28.2* 27.7* 30.5* 28.1*  PLT 647* 685* 798* 754*    COAGS: Recent Labs    12/08/16 0747  INR 1.08    BMP: Recent Labs    12/10/16 0608 12/11/16 0539 12/12/16 0645  12/14/16 0350  NA 134* 132* 133* 136  K 4.1 4.0 3.6 4.2  CL 103 99* 101 107  CO2 24 25 25 25   GLUCOSE 105* 97 96 112*  BUN 7 7 9 7   CALCIUM 8.0* 8.0* 8.0* 7.8*  CREATININE 0.88 0.93 0.94 0.94  GFRNONAA >60 >60 >60 >60  GFRAA >60 >60 >60 >60    LIVER FUNCTION TESTS: Recent Labs    12/10/16 0608 12/11/16 0539 12/12/16 0645 12/14/16 0350  BILITOT 1.2 1.0 1.0 0.7  AST 18 21 20 22   ALT 38 32 28 25  ALKPHOS 134* 138* 134* 119  PROT 6.2* 6.0* 6.6 5.7*  ALBUMIN 2.2* 2.2* 2.4* 2.1*    Assessment and Plan: Lap chole with biloma, s/p drain placement Drain remains in place; 325 mL output yesterday. CBC/CMP pending. Upper endo 11/8 shows cricopharyngeal edema.  ERCP planned for Monday.  Keep drain to bag for now.  IR to follow.   Electronically Signed: Hoyt Koch 12/16/2016, 10:48 AM   I spent a total of 15 Minutes at the the patient's bedside AND on the patient's hospital floor or unit, greater than 50% of which was counseling/coordinating care for biloma

## 2016-12-16 NOTE — Progress Notes (Signed)
Subjective: Mild sore throat.  Objective: Vital signs in last 24 hours: Temp:  [97.8 F (36.6 C)-98.9 F (37.2 C)] 98.6 F (37 C) (11/09 0447) Pulse Rate:  [91-109] 96 (11/09 0836) Resp:  [0-26] 16 (11/09 0447) BP: (117-145)/(71-93) 117/76 (11/09 0836) SpO2:  [96 %-100 %] 96 % (11/09 0447) Weight:  [195 lb 8 oz (88.7 kg)-208 lb (94.3 kg)] 195 lb 8 oz (88.7 kg) (11/09 0449) Weight change: -1.6 oz (-0.045 kg) Last BM Date: 12/15/16  PE: GEN:  NAD  Lab Results: No results found for this or any previous visit (from the past 48 hour(s)).  Assessment:  1.  Bile leak. 2.  Cricopharyngeal edema, improving, from recent endotracheal intubation + duodenoscope passage.  Plan:  1.  ERCP planned Monday with Dr. Ewing Schlein. 2.  [facility] will follow at a distance over the weekend.   [name] 12/16/2016, 10:11 AM   Cell [phone] If no answer or after 5 PM call [phone]

## 2016-12-16 NOTE — Progress Notes (Signed)
1 Day Post-Op   Subjective/Chief Complaint: Mild throat irritation   Objective: Vital signs in last 24 hours: Temp:  [97.8 F (36.6 C)-98.9 F (37.2 C)] 98.6 F (37 C) (11/09 0447) Pulse Rate:  [91-109] 96 (11/09 0447) Resp:  [0-26] 16 (11/09 0447) BP: (117-145)/(71-93) 117/76 (11/09 0447) SpO2:  [96 %-100 %] 96 % (11/09 0447) Weight:  [88.7 kg (195 lb 8 oz)-94.3 kg (208 lb)] 88.7 kg (195 lb 8 oz) (11/09 0449) Last BM Date: 12/15/16  Intake/Output from previous day: 11/08 0701 - 11/09 0700 In: 2631.7 [P.O.:240; I.V.:2241.7; IV Piggyback:150] Out: 2960 [Urine:2600; Drains:360] Intake/Output this shift: No intake/output data recorded.  General appearance: alert and cooperative Resp: clear to auscultation bilaterally Cardio: regular rate and rhythm GI: soft, incisions CDI, RUQ drain with clear bile  Lab Results:  Recent Labs    12/14/16 0350  WBC 15.3*  HGB 8.7*  HCT 28.1*  PLT 754*   BMET Recent Labs    12/14/16 0350  NA 136  K 4.2  CL 107  CO2 25  GLUCOSE 112*  BUN 7  CREATININE 0.94  CALCIUM 7.8*   PT/INR No results for input(s): LABPROT, INR in the last 72 hours. ABG No results for input(s): PHART, HCO3 in the last 72 hours.  Invalid input(s): PCO2, PO2  Studies/Results: No results found.  Anti-infectives: Anti-infectives (From admission, onward)   Start     Dose/Rate Route Frequency Ordered Stop   12/07/16 0830  piperacillin-tazobactam (ZOSYN) IVPB 3.375 g     3.375 g 12.5 mL/hr over 240 Minutes Intravenous Every 8 hours 12/07/16 0736     12/02/16 1500  cefTRIAXone (ROCEPHIN) 2 g in dextrose 5 % 50 mL IVPB  Status:  Discontinued     2 g 100 mL/hr over 30 Minutes Intravenous Every 24 hours 12/02/16 1337 12/07/16 0736      Assessment/Plan: Gangrenous cholecystitis with cholelithiasis  Laparoscopic cholecystectomy, 19 French Blake drain placement, gangrenous changes involving the entirety of the back wall, short cystic duct precluded  cholangiogram10/27/18, Dr. Violeta Gelinas CT 10/31: 19.5 cm fluid collection - Biloma/abscess 17.4 cm possible hematoma IR drain Placed 12/08/16, 1050 In drain on return from the floor and 2035 ml total yesterday.  Drain 360/24h ERCP attempt 11/6 Dr. Dulce Sellar - unable to do (food in stomach) ERCP attempt 11/8 Dr. Dulce Sellar - unable to do (throat edema)  Acute kidney injury: resolved  Anemia  RLL pleural effusion/atelectasis - IS  Active problems: Hypertension - Toprol - 25mg  QD Hx of Borderline CHF - Grade 2 diastolic dysfunction/EF 44% Hyperlipidemia History of thyroid disease FEN: clears and go to fulls if he tolerates them.  ID: Rocephin 10/26 -10/31. Zosyn 10/31 =>> DVT: SCD's, SQ hep Follow up: Dr Janee Morn  Plan: Second attempt at ERCP failed.Keep in hospital awaiting improvement in pharyngeal edema with plan for ERCP in a few days.  I had a long talk with him. He is frustrated with the situation as he is missing work and his family. He has a young son and his sister just had a baby, his niece who is now in the NICU, in [place]. He feels bad he cannot be with them. I assured him his large team of caregivers is working hard to coordinate expeditious care.  LOS: 13 days    Bobby Kelly E 12/16/2016

## 2016-12-17 LAB — COMPREHENSIVE METABOLIC PANEL
ALBUMIN: 2.3 g/dL — AB (ref 3.5–5.0)
ALT: 30 U/L (ref 17–63)
AST: 22 U/L (ref 15–41)
Alkaline Phosphatase: 147 U/L — ABNORMAL HIGH (ref 38–126)
Anion gap: 6 (ref 5–15)
CHLORIDE: 106 mmol/L (ref 101–111)
CO2: 25 mmol/L (ref 22–32)
Calcium: 8.1 mg/dL — ABNORMAL LOW (ref 8.9–10.3)
Creatinine, Ser: 0.9 mg/dL (ref 0.61–1.24)
GFR calc Af Amer: >60 mL/min (ref >60)
GFR calc non Af Amer: >60 mL/min (ref >60)
GLUCOSE: 90 mg/dL (ref 65–99)
POTASSIUM: 3.7 mmol/L (ref 3.5–5.1)
SODIUM: 137 mmol/L (ref 135–145)
Total Bilirubin: 1.1 mg/dL (ref 0.3–1.2)
Total Protein: 6 g/dL — ABNORMAL LOW (ref 6.5–8.1)

## 2016-12-17 LAB — CBC
HEMATOCRIT: 32.7 % — AB (ref 39.0–52.0)
Hemoglobin: 9.8 g/dL — ABNORMAL LOW (ref 13.0–17.0)
MCH: 28 pg (ref 26.0–34.0)
MCHC: 30 g/dL (ref 30.0–36.0)
MCV: 93.4 fL (ref 78.0–100.0)
Platelets: 735 10*3/uL — ABNORMAL HIGH (ref 150–400)
RBC: 3.5 MIL/uL — ABNORMAL LOW (ref 4.22–5.81)
RDW: 15.1 % (ref 11.5–15.5)
WBC: 11.5 10*3/uL — AB (ref 4.0–10.5)

## 2016-12-17 NOTE — Progress Notes (Signed)
Subjective No acute events. Feeling well, no complaints. ERCP x2, failed 2nd attempt yesterday.  Objective: Vital signs in last 24 hours: Temp:  [97.9 F (36.6 C)-98.5 F (36.9 C)] 97.9 F (36.6 C) (11/10 0446) Pulse Rate:  [81-105] 81 (11/10 0819) Resp:  [18] 18 (11/10 0446) BP: (104-123)/(64-76) 104/64 (11/10 0819) SpO2:  [99 %-100 %] 99 % (11/10 0446) Weight:  [90.8 kg (200 lb 1.6 oz)] 90.8 kg (200 lb 1.6 oz) (11/10 0446) Last BM Date: 12/17/16  Intake/Output from previous day: 11/09 0701 - 11/10 0700 In: 1060 [P.O.:1060] Out: 3555 [Urine:2270; Drains:1285] Intake/Output this shift: Total I/O In: 360 [P.O.:360] Out: -   Gen: NAD, comfortable CV: RRR Pulm: Normal work of breathing Abd: Soft, NT/ND; JP in place with clear brown effluent; IR drain to gravity with golden bile in bag Ext: SCDs in place  Lab Results: CBC  Recent Labs    12/17/16 0529  WBC 11.5*  HGB 9.8*  HCT 32.7*  PLT 735*   BMET Recent Labs    12/17/16 0529  NA 137  K 3.7  CL 106  CO2 25  GLUCOSE 90  BUN <5*  CREATININE 0.90  CALCIUM 8.1*     Anti-infectives: Anti-infectives (From admission, onward)   Start     Dose/Rate Route Frequency Ordered Stop   12/07/16 0830  piperacillin-tazobactam (ZOSYN) IVPB 3.375 g     3.375 g 12.5 mL/hr over 240 Minutes Intravenous Every 8 hours 12/07/16 0736     12/02/16 1500  cefTRIAXone (ROCEPHIN) 2 g in dextrose 5 % 50 mL IVPB  Status:  Discontinued     2 g 100 mL/hr over 30 Minutes Intravenous Every 24 hours 12/02/16 1337 12/07/16 0736       Assessment/Plan: Patient Active Problem List   Diagnosis Date Noted  . Acute calculous cholecystitis 12/02/2016  . Routine general medical examination at a health care facility 11/03/2016  . Need for influenza vaccination 11/03/2016  . Class 2 obesity with serious comorbidity and body mass index (BMI) of 36.0 to 36.9 in adult 11/03/2016  . Vaccine counseling 11/03/2016  . Heart disease 11/03/2016  .  Palpitation 11/03/2016  . Constipation 04/28/2016   Gangrenous cholecystitis with cholelithiasis  Laparoscopic cholecystectomy, 19 French Blake drain placement, gangrenous changes involving the entirety of the back wall, short cystic duct precluded cholangiogram10/27/18, Dr. Violeta Gelinas CT 10/31: 19.5 cm fluid collection - Biloma/abscess 17.4 cm possible hematoma IR drain Placed 12/08/16, 1050 In drain on return from the floor and 2035 ml total yesterday.  Drain 360/24h ERCP attempt 11/6 Dr. Dulce Sellar - unable to do (food in stomach) ERCP attempt 11/8 Dr. Dulce Sellar - unable to do (throat edema)  LFTs normal In good spirits today Continue to monitor drain output - 1.3L/24hrs. Monitor LFTs, BMP, UOP. Anticipate possible ERCP attempts Monday   LOS: 14 days   Stephanie Coup. Cliffton Asters, M.D. General and Colorectal Surgery [facility]

## 2016-12-18 LAB — CBC WITH DIFFERENTIAL/PLATELET
Basophils Absolute: 0 10*3/uL (ref 0.0–0.1)
Basophils Relative: 0 %
EOS PCT: 5 %
Eosinophils Absolute: 0.4 10*3/uL (ref 0.0–0.7)
HEMATOCRIT: 30.2 % — AB (ref 39.0–52.0)
Hemoglobin: 9.2 g/dL — ABNORMAL LOW (ref 13.0–17.0)
LYMPHS ABS: 2.7 10*3/uL (ref 0.7–4.0)
LYMPHS PCT: 30 %
MCH: 28.2 pg (ref 26.0–34.0)
MCHC: 30.5 g/dL (ref 30.0–36.0)
MCV: 92.6 fL (ref 78.0–100.0)
MONO ABS: 0.8 10*3/uL (ref 0.1–1.0)
Monocytes Relative: 9 %
NEUTROS ABS: 5.1 10*3/uL (ref 1.7–7.7)
Neutrophils Relative %: 56 %
PLATELETS: 658 10*3/uL — AB (ref 150–400)
RBC: 3.26 MIL/uL — AB (ref 4.22–5.81)
RDW: 14.7 % (ref 11.5–15.5)
WBC: 8.9 10*3/uL (ref 4.0–10.5)

## 2016-12-18 LAB — COMPREHENSIVE METABOLIC PANEL
ALT: 22 U/L (ref 17–63)
AST: 16 U/L (ref 15–41)
Albumin: 2.2 g/dL — ABNORMAL LOW (ref 3.5–5.0)
Alkaline Phosphatase: 116 U/L (ref 38–126)
Anion gap: 5 (ref 5–15)
BILIRUBIN TOTAL: 0.8 mg/dL (ref 0.3–1.2)
CALCIUM: 8 mg/dL — AB (ref 8.9–10.3)
CHLORIDE: 109 mmol/L (ref 101–111)
CO2: 25 mmol/L (ref 22–32)
CREATININE: 0.9 mg/dL (ref 0.61–1.24)
Glucose, Bld: 87 mg/dL (ref 65–99)
Potassium: 3.6 mmol/L (ref 3.5–5.1)
Sodium: 139 mmol/L (ref 135–145)
TOTAL PROTEIN: 5.6 g/dL — AB (ref 6.5–8.1)

## 2016-12-18 NOTE — Progress Notes (Signed)
Patient ID: Bobby Kelly, male   DOB: Jan 24, 1981, 36 y.o.   MRN: [id number]    Referring Physician(s): Dr. Carman Ching  Supervising Physician: Simonne Come  Patient Status: [facility] - In-pt  Chief Complaint: Bile leak  Subjective: Patient feels well today.  Failed ERCP x2.  Tolerating his diet.  Allergies: Patient has no known allergies.  Medications: Prior to Admission medications   Medication Sig Start Date End Date Taking? Authorizing Provider  lisinopril (PRINIVIL,ZESTRIL) 20 MG tablet Take 1 tablet (20 mg total) by mouth daily. 10/17/16 01/15/17 Yes Camnitz, Will Daphine Deutscher, MD  metoprolol succinate (TOPROL-XL) 50 MG 24 hr tablet Take 1 tablet (50 mg total) by mouth daily. Take with or immediately following a meal. 10/17/16  Yes Camnitz, Will Daphine Deutscher, MD  ondansetron (ZOFRAN ODT) 4 MG disintegrating tablet Take 1 tablet (4 mg total) by mouth every 8 (eight) hours as needed for nausea or vomiting. 11/26/16  Yes Khatri, Hina, PA-C  oxyCODONE-acetaminophen (PERCOCET/ROXICET) 5-325 MG tablet Take 1 tablet by mouth every 8 (eight) hours as needed for severe pain. 11/26/16  Yes Khatri, Hina, PA-C  pravastatin (PRAVACHOL) 20 MG tablet Take 1 tablet (20 mg total) by mouth every evening. 11/07/16 11/07/17 Yes Tysinger, Kermit Balo, PA-C    Vital Signs: BP 110/79   Pulse 95   Temp 98.4 F (36.9 C) (Oral)   Resp 18   Ht 5\' 5"  (1.651 m)   Wt 202 lb (91.6 kg)   SpO2 98%   BMI 33.61 kg/m   Physical Exam: Abd: drain in place with bilious output.  Having about 1L of output on a daily basis.  Site is c/d/i  Imaging: No results found.  Labs:  CBC: Recent Labs    12/12/16 0645 12/14/16 0350 12/17/16 0529 12/18/16 0433  WBC 15.1* 15.3* 11.5* 8.9  HGB 9.6* 8.7* 9.8* 9.2*  HCT 30.5* 28.1* 32.7* 30.2*  PLT 798* 754* 735* 658*    COAGS: Recent Labs    12/08/16 0747  INR 1.08    BMP: Recent Labs    12/12/16 0645 12/14/16 0350 12/17/16 0529 12/18/16 0433  NA 133* 136 137 139  K  3.6 4.2 3.7 3.6  CL 101 107 106 109  CO2 25 25 25 25   GLUCOSE 96 112* 90 87  BUN 9 7 <5* <5*  CALCIUM 8.0* 7.8* 8.1* 8.0*  CREATININE 0.94 0.94 0.90 0.90  GFRNONAA >60 >60 >60 >60  GFRAA >60 >60 >60 >60    LIVER FUNCTION TESTS: Recent Labs    12/12/16 0645 12/14/16 0350 12/17/16 0529 12/18/16 0433  BILITOT 1.0 0.7 1.1 0.8  AST 20 22 22 16   ALT 28 25 30 22   ALKPHOS 134* 119 147* 116  PROT 6.6 5.7* 6.0* 5.6*  ALBUMIN 2.4* 2.1* 2.3* 2.2*    Assessment and Plan: 1. Biloma, s/p perc drain  Cont with drain for now as he is still awaiting ERCP with stent placement to hopefully begin to alleviate some of the drainage out of his drains.   Will continue to follow  Electronically Signed: Erron Wengert E 12/18/2016, 12:24 PM   I spent a total of 15 Minutes at the the patient's bedside AND on the patient's hospital floor or unit, greater than 50% of which was counseling/coordinating care for biloma

## 2016-12-18 NOTE — Progress Notes (Signed)
Subjective No acute events. Feeling well, no complaints. Watching soccer this morning.  Objective: Vital signs in last 24 hours: Temp:  [97.9 F (36.6 C)-98.4 F (36.9 C)] 98.4 F (36.9 C) (11/11 0515) Pulse Rate:  [80-96] 95 (11/11 0753) Resp:  [18] 18 (11/11 0515) BP: (110-126)/(67-79) 110/79 (11/11 0753) SpO2:  [98 %-100 %] 98 % (11/11 0515) Weight:  [91.6 kg (202 lb)] 91.6 kg (202 lb) (11/11 0515) Last BM Date: 12/18/16(watery)  Intake/Output from previous day: 11/10 0701 - 11/11 0700 In: 6168.3 [P.O.:1080; I.V.:4738.3; IV Piggyback:350] Out: 4080 [Urine:3050; Drains:1030] Intake/Output this shift: Total I/O In: 300 [P.O.:300] Out: -   Gen: NAD, comfortable CV: RRR Pulm: Normal work of breathing Abd: Soft, NT/ND; JP in place with clear brown effluent; IR drain to gravity with golden bile in bag Ext: SCDs in place  Lab Results: CBC  Recent Labs    12/17/16 0529 12/18/16 0433  WBC 11.5* 8.9  HGB 9.8* 9.2*  HCT 32.7* 30.2*  PLT 735* 658*   BMET Recent Labs    12/17/16 0529 12/18/16 0433  NA 137 139  K 3.7 3.6  CL 106 109  CO2 25 25  GLUCOSE 90 87  BUN <5* <5*  CREATININE 0.90 0.90  CALCIUM 8.1* 8.0*     Anti-infectives: Anti-infectives (From admission, onward)   Start     Dose/Rate Route Frequency Ordered Stop   12/07/16 0830  piperacillin-tazobactam (ZOSYN) IVPB 3.375 g     3.375 g 12.5 mL/hr over 240 Minutes Intravenous Every 8 hours 12/07/16 0736     12/02/16 1500  cefTRIAXone (ROCEPHIN) 2 g in dextrose 5 % 50 mL IVPB  Status:  Discontinued     2 g 100 mL/hr over 30 Minutes Intravenous Every 24 hours 12/02/16 1337 12/07/16 0736       Assessment/Plan: Patient Active Problem List   Diagnosis Date Noted  . Acute calculous cholecystitis 12/02/2016  . Routine general medical examination at a health care facility 11/03/2016  . Need for influenza vaccination 11/03/2016  . Class 2 obesity with serious comorbidity and body mass index (BMI) of  36.0 to 36.9 in adult 11/03/2016  . Vaccine counseling 11/03/2016  . Heart disease 11/03/2016  . Palpitation 11/03/2016  . Constipation 04/28/2016   Gangrenous cholecystitis with cholelithiasis  Laparoscopic cholecystectomy, 19 French Blake drain placement, gangrenous changes involving the entirety of the back wall, short cystic duct precluded cholangiogram10/27/18, Dr. Violeta Gelinas CT 10/31: 19.5 cm fluid collection - Biloma/abscess 17.4 cm possible hematoma IR drain Placed 12/08/16, 1050 In drain on return from the floor and 2035 ml total yesterday.  Drain 360/24h ERCP attempt 11/6 Dr. Dulce Sellar - unable to do (food in stomach) ERCP attempt 11/8 Dr. Dulce Sellar - unable to do (throat edema)  LFTs normal In good spirits today Continue to monitor drain output - 1L/24hrs; 1.3L the prior 24hrs. Monitor LFTs, BMP, UOP.  Anticipate possible ERCP tomorrow - NPO after MN tonight   LOS: 15 days   Bobby Kelly. Cliffton Asters, M.D. General and Colorectal Surgery [facility]

## 2016-12-19 ENCOUNTER — Encounter (HOSPITAL_COMMUNITY): Payer: Self-pay | Admitting: *Deleted

## 2016-12-19 ENCOUNTER — Inpatient Hospital Stay (HOSPITAL_COMMUNITY): Payer: No Typology Code available for payment source | Admitting: Anesthesiology

## 2016-12-19 ENCOUNTER — Encounter (HOSPITAL_COMMUNITY): Admission: AD | Disposition: A | Discharge status: Home / Self Care | Payer: Self-pay | Source: Ambulatory Visit

## 2016-12-19 ENCOUNTER — Ambulatory Visit: Payer: Self-pay | Admitting: Gastroenterology

## 2016-12-19 ENCOUNTER — Encounter: Payer: Self-pay | Admitting: Gastroenterology

## 2016-12-19 ENCOUNTER — Inpatient Hospital Stay (HOSPITAL_COMMUNITY): Payer: No Typology Code available for payment source

## 2016-12-19 HISTORY — PX: ERCP: SHX5425

## 2016-12-19 SURGERY — ERCP, WITH INTERVENTION IF INDICATED
Anesthesia: General

## 2016-12-19 MED ORDER — DEXAMETHASONE SODIUM PHOSPHATE 10 MG/ML IJ SOLN
INTRAMUSCULAR | Status: DC | PRN
  Administered 2016-12-19: 10 mg via INTRAVENOUS

## 2016-12-19 MED ORDER — SODIUM CHLORIDE 0.9 % IV SOLN
INTRAVENOUS | Status: DC | PRN
  Administered 2016-12-19: 30 mL

## 2016-12-19 MED ORDER — IOPAMIDOL (ISOVUE-300) INJECTION 61%
INTRAVENOUS | Status: AC
  Filled 2016-12-19: qty 50

## 2016-12-19 MED ORDER — SUCCINYLCHOLINE CHLORIDE 20 MG/ML IJ SOLN
INTRAMUSCULAR | Status: DC | PRN
  Administered 2016-12-19: 100 mg via INTRAVENOUS

## 2016-12-19 MED ORDER — ONDANSETRON HCL 4 MG/2ML IJ SOLN
INTRAMUSCULAR | Status: DC | PRN
  Administered 2016-12-19: 4 mg via INTRAVENOUS

## 2016-12-19 MED ORDER — PROPOFOL 10 MG/ML IV BOLUS
INTRAVENOUS | Status: DC | PRN
  Administered 2016-12-19: 120 mg via INTRAVENOUS

## 2016-12-19 MED ORDER — FENTANYL CITRATE (PF) 100 MCG/2ML IJ SOLN
INTRAMUSCULAR | Status: DC | PRN
  Administered 2016-12-19 (×4): 50 ug via INTRAVENOUS

## 2016-12-19 MED ORDER — INDOMETHACIN 50 MG RE SUPP
RECTAL | Status: DC | PRN
  Administered 2016-12-19 (×2): 50 mg via RECTAL

## 2016-12-19 MED ORDER — MIDAZOLAM HCL 5 MG/5ML IJ SOLN
INTRAMUSCULAR | Status: DC | PRN
  Administered 2016-12-19: 2 mg via INTRAVENOUS

## 2016-12-19 MED ORDER — SODIUM CHLORIDE 0.9% FLUSH
10.0000 mL | INTRAVENOUS | Status: DC | PRN
  Administered 2016-12-22: 10 mL
  Filled 2016-12-19: qty 40

## 2016-12-19 MED ORDER — GLUCAGON HCL RDNA (DIAGNOSTIC) 1 MG IJ SOLR
INTRAMUSCULAR | Status: AC
  Filled 2016-12-19: qty 1

## 2016-12-19 MED ORDER — LACTATED RINGERS IV SOLN
INTRAVENOUS | Status: DC
  Administered 2016-12-19: 08:00:00 via INTRAVENOUS

## 2016-12-19 MED ORDER — PHENYLEPHRINE HCL 10 MG/ML IJ SOLN
INTRAMUSCULAR | Status: DC | PRN
  Administered 2016-12-19 (×2): 120 ug via INTRAVENOUS
  Administered 2016-12-19: 80 ug via INTRAVENOUS

## 2016-12-19 MED ORDER — LIDOCAINE HCL (CARDIAC) 20 MG/ML IV SOLN
INTRAVENOUS | Status: DC | PRN
  Administered 2016-12-19: 60 mg via INTRAVENOUS

## 2016-12-19 MED ORDER — INDOMETHACIN 50 MG RE SUPP
RECTAL | Status: AC
  Filled 2016-12-19: qty 2

## 2016-12-19 NOTE — Op Note (Signed)
[facility] Patient Name: Bobby Kelly Procedure Date : 12/19/2016 MRN: [id number] Attending MD: Vida Rigger , MD Date of Birth: 03-29-1980 CSN: [id number] Age: 36 Admit Type: Inpatient Procedure:                ERCP Indications:              Treatment of bile leak Providers:                Vida Rigger, MD, Norman Clay, RN, Margo Aye,                            Technician Referring MD:              Medicines:                General Anesthesia Complications:            No immediate complications. Estimated Blood Loss:     Estimated blood loss: none. Procedure:                Pre-Anesthesia Assessment:                           - Prior to the procedure, a History and Physical                            was performed, and patient medications and                            allergies were reviewed. The patient's tolerance of                            previous anesthesia was also reviewed. The risks                            and benefits of the procedure and the sedation                            options and risks were discussed with the patient.                            All questions were answered, and informed consent                            was obtained. Prior Anticoagulants: The patient has                            taken no previous anticoagulant or antiplatelet                            agents. ASA Grade Assessment: I - A normal, healthy                            patient. After reviewing the risks and benefits,                            the  patient was deemed in satisfactory condition to                            undergo the procedure.                           After obtaining informed consent, the scope was                            passed under direct vision. Throughout the                            procedure, the patient's blood pressure, pulse, and                            oxygen saturations were monitored continuously. The      [id number] scope was introduced through the                            mouth, and used to inject contrast into and used to                            locate the major papilla. The ERCP was accomplished                            without difficulty. The patient tolerated the                            procedure well. Scope In: Scope Out: Findings:      The major papilla was normal.deep selective cannulation was obtained       readily and there was no pancreatic duct injection or wire advancement       and on initial cholangiogram obvious cystic duct leaking was confirmed       and we cannot fill the intrahepatics very well due to the leak and we       proceeded with a Biliary sphincterotomy which was made with a Hydratome       sphincterotome using ERBE electrocautery. There was no       post-sphincterotomy bleeding. Extravasation of contrast originating from       the cystic duct was observed. we could get the fully bowed       sphincterotome easily in and out of the sphincterotomy site but there       was not much drainage however contrast seemed to preferentiallyexits the       leak readily and we proceeded with One 10 Fr by 7 cm plastic stent with       a single external flap and a single internal flap was placed 6 cm into       the common bile duct. Bile flowed through the stent. The stent was in       good position. guidewire and introducer were removed and the scope was       removed and the patient tolerated the procedure well Impression:               - The major papilla appeared normal.                           -  A bile leak was found. This was treated with a                            sphincterotomy and stenting as above.                           - A biliary sphincterotomy was performed.                           - One plastic stent was placed into the common bile                            duct. there was no pancreatic duct injection or                             wire advancement throughout the procedure Moderate Sedation:      moderate sedation-none Recommendation:           - Clear liquid diet for 6 hours. ay slowly advance                            later this afternoon if doing well                           - Continue present medications.                           - Return to GI clinic in 3 weeks to discuss the                            timing of stent removal.                           - Telephone GI clinic if symptomatic PRN. will                            check on tomorrow Procedure Code(s):        --- Professional ---                           [phone], Esophagogastroduodenoscopy, flexible,                            transoral; diagnostic, including collection of                            specimen(s) by brushing or washing, when performed                            (separate procedure) Diagnosis Code(s):        --- Professional ---                           K83.9, Disease of biliary tract, unspecified  K83.8, Other specified diseases of biliary tract CPT copyright 2016 American Medical [employer]. All rights reserved. The codes documented in this report are preliminary and upon coder review may  be revised to meet current compliance requirements. Vida RiggerMarc Shalayne Leach, MD 12/19/2016 9:01:22 AM This report has been signed electronically. Number of Addenda: 0

## 2016-12-19 NOTE — Progress Notes (Signed)
0950 received pt back from Endo, A&O x4, denies pain at this time. Sips of clear liquids started at 1000. Tolerated.

## 2016-12-19 NOTE — Transfer of Care (Signed)
Immediate Anesthesia Transfer of Care Note  Patient: Bobby Kelly  Procedure(s) Performed: ENDOSCOPIC RETROGRADE CHOLANGIOPANCREATOGRAPHY (ERCP) (N/A )  Patient Location: Endoscopy Unit  Anesthesia Type:General  Level of Consciousness: awake and patient cooperative  Airway & Oxygen Therapy: Patient Spontanous Breathing  Post-op Assessment: Report given to RN and Post -op Vital signs reviewed and stable  Post vital signs: Reviewed and stable  Last Vitals:  Vitals:   12/19/16 0745 12/19/16 0913  BP: 132/78 131/74  Pulse: 92 100  Resp: 19 20  Temp: 37.1 C 36.7 C  SpO2: 98% 98%    Last Pain:  Vitals:   12/19/16 0913  TempSrc: Oral  PainSc:       Patients Stated Pain Goal: 1 (12/18/16 0511)  Complications: No apparent anesthesia complications

## 2016-12-19 NOTE — Anesthesia Procedure Notes (Signed)
Procedure Name: Intubation Date/Time: 12/19/2016 8:20 AM Performed by: Rosiland Oz, CRNA Pre-anesthesia Checklist: Patient identified, Suction available, Emergency Drugs available, Patient being monitored and Timeout performed Patient Re-evaluated:Patient Re-evaluated prior to induction Oxygen Delivery Method: Circle system utilized Preoxygenation: Pre-oxygenation with 100% oxygen Induction Type: IV induction Laryngoscope Size: Miller and 3 Grade View: Grade I Tube type: Oral Tube size: 7.5 mm Number of attempts: 1 Airway Equipment and Method: Stylet Placement Confirmation: ETT inserted through vocal cords under direct vision,  positive ETCO2 and breath sounds checked- equal and bilateral Secured at: 22 cm Tube secured with: Tape Dental Injury: Teeth and Oropharynx as per pre-operative assessment

## 2016-12-19 NOTE — Anesthesia Preprocedure Evaluation (Addendum)
Anesthesia Evaluation  Patient identified by MRN, date of birth, ID band Patient awake    Reviewed: Allergy & Precautions, NPO status , Patient's Chart, lab work & pertinent test results, reviewed documented beta blocker date and time   History of Anesthesia Complications Negative for: history of anesthetic complications  Airway Mallampati: II  TM Distance: >3 FB Neck ROM: Full    Dental no notable dental hx. (+) Dental Advisory Given, Teeth Intact   Pulmonary neg pulmonary ROS,    Pulmonary exam normal breath sounds clear to auscultation       Cardiovascular hypertension, Pt. on medications and Pt. on home beta blockers +CHF  Normal cardiovascular exam Rhythm:Regular Rate:Normal  ECHO: LV EF: 35% -   40%  Nuclear stress EF: 44%. No significant change in nonspecific ST abnormality during infusion. There is a small defect of mild severity present in the basal inferior and mid inferior location. The defect is partially reversible and consistent with infarct with peri infarct ischemia vs. Variations in diaphragmatic attenuation artifact. The left ventricular ejection fraction is moderately decreased (30-44%). This is an intermediate risk study. Recommend 2D echo for further assessment of LVF and inferior wall motion.     Neuro/Psych negative neurological ROS  negative psych ROS   GI/Hepatic Neg liver ROS, bile leak   Endo/Other  negative endocrine ROS  Renal/GU negative Renal ROS     Musculoskeletal negative musculoskeletal ROS (+)   Abdominal (+) + obese,   Peds  Hematology  (+) anemia ,   Anesthesia Other Findings HLD  Reproductive/Obstetrics                             Anesthesia Physical  Anesthesia Plan  ASA: III  Anesthesia Plan: General   Post-op Pain Management:    Induction: Intravenous  PONV Risk Score and Plan: 3 and Ondansetron, Dexamethasone and Midazolam  Airway  Management Planned: Oral ETT  Additional Equipment:   Intra-op Plan:   Post-operative Plan: Extubation in OR  Informed Consent: I have reviewed the patients History and Physical, chart, labs and discussed the procedure including the risks, benefits and alternatives for the proposed anesthesia with the patient or authorized representative who has indicated his/her understanding and acceptance.   Dental advisory given  Plan Discussed with: CRNA  Anesthesia Plan Comments:         Anesthesia Quick Evaluation

## 2016-12-19 NOTE — Progress Notes (Signed)
Day of Surgery    CC: Abdominal pain  Subjective: He is back from ERCP, drain still pretty full.  He feels fine and encouraged with the ERCP being successful this AM.  Objective: Vital signs in last 24 hours: Temp:  [98.1 F (36.7 C)-98.8 F (37.1 C)] 98.7 F (37.1 C) (11/12 0951) Pulse Rate:  [71-100] 93 (11/12 0951) Resp:  [18-21] 20 (11/12 0951) BP: (115-144)/(62-78) 144/78 (11/12 0951) SpO2:  [96 %-100 %] 96 % (11/12 0951) Weight:  [90.5 kg (199 lb 8.3 oz)] 90.5 kg (199 lb 8.3 oz) (11/12 0456) Last BM Date: 12/18/16 840 PO 900 IV 5375 urine Drain 1245 ml BM x 1 Afebrile, VSS No labs this AM    Intake/Output from previous day: 11/11 0701 - 11/12 0700 In: 1718.3 [P.O.:840; I.V.:878.3] Out: 6620 [Urine:5375; Drains:1245] Intake/Output this shift: Total I/O In: 1120 [P.O.:120; I.V.:1000] Out: 480 [Drains:480]  General appearance: alert, cooperative and no distress Resp: clear to auscultation bilaterally GI: soft sore, sites OK,  the IR drain and JP both have brown fluid in them.    Lab Results:  Recent Labs    12/17/16 0529 12/18/16 0433  WBC 11.5* 8.9  HGB 9.8* 9.2*  HCT 32.7* 30.2*  PLT 735* 658*    BMET Recent Labs    12/17/16 0529 12/18/16 0433  NA 137 139  K 3.7 3.6  CL 106 109  CO2 25 25  GLUCOSE 90 87  BUN <5* <5*  CREATININE 0.90 0.90  CALCIUM 8.1* 8.0*   PT/INR No results for input(s): LABPROT, INR in the last 72 hours.  Recent Labs  Lab 12/14/16 0350 12/17/16 0529 12/18/16 0433  AST 22 22 16   ALT 25 30 22   ALKPHOS 119 147* 116  BILITOT 0.7 1.1 0.8  PROT 5.7* 6.0* 5.6*  ALBUMIN 2.1* 2.3* 2.2*     Lipase     Component Value Date/Time   LIPASE 35 12/14/2016 0350     Medications: . acetaminophen  1,000 mg Oral Q8H  . docusate sodium  100 mg Oral BID  . heparin subcutaneous  5,000 Units Subcutaneous Q8H  . metoprolol succinate  25 mg Oral Q breakfast  . multivitamins with iron  1 tablet Oral QPC supper  . pantoprazole   40 mg Oral Daily   . dextrose 5 % and 0.9 % NaCl with KCl 20 mEq/L 100 mL/hr at 12/19/16 0303  . methocarbamol (ROBAXIN)  IV Stopped (12/12/16 2200)  . piperacillin-tazobactam (ZOSYN)  IV Stopped (12/19/16 1015)   Anti-infectives (From admission, onward)   Start     Dose/Rate Route Frequency Ordered Stop   12/07/16 0830  piperacillin-tazobactam (ZOSYN) IVPB 3.375 g     3.375 g 12.5 mL/hr over 240 Minutes Intravenous Every 8 hours 12/07/16 0736     12/02/16 1500  cefTRIAXone (ROCEPHIN) 2 g in dextrose 5 % 50 mL IVPB  Status:  Discontinued     2 g 100 mL/hr over 30 Minutes Intravenous Every 24 hours 12/02/16 1337 12/07/16 0736      Assessment/Plan Gangrenous cholecystitis with cholelithiasis  Laparoscopic cholecystectomy, 19 French Blake drain placement, gangrenous changes involving the entirety of the back wall, short cystic duct precluded cholangiogram10/27/18, Dr. Violeta Gelinas CT 10/31: 19.5 cm fluid collection - Biloma/abscess 17.4 cm possible hematoma IR drain Placed 12/08/16, 1050 In drain on return from the floor and 2035 ml total yesterday.  Drain 360/24h ERCP attempt 11/6 Dr. Dulce Sellar - unable to do (food in stomach) ERCP attempt 11/8 Dr. Dulce Sellar -  unable to do (throat edema) ERCP 10Fr 7 cm drain placed, sphincterotomy   12/19/16, Dr. Ewing SchleinMagod   Acute kidney injury: resolved  Anemia  RLL pleural effusion/atelectasis - IS  Active problems: Hypertension - Toprol - 25mg  QD Hx of Borderline CHF - Grade 2 diastolic dysfunction/EF 44% Hyperlipidemia History of thyroid disease FEN: clears and go to fulls if he tolerates them.  ID: Rocephin 10/26 -10/31. Zosyn 10/31 =>>day 13 DVT: SCD's, SQ hep Follow up: Dr Janee Mornhompson   Plan:  Wait and see how things proceed with successful ERCP      LOS: 16 days    Bobby Kelly 12/19/2016 [phone]

## 2016-12-19 NOTE — Progress Notes (Signed)
Bobby Kelly 8:13 AM  Subjective: Patient doing well wants to eat no new complaints hospital computer chart reviewed and case discussed with Dr. Dulce Sellar  Objective: Vital signs stable afebrile exam please see preassessment evaluation yesterday's labs okay  Assessment: Bile leak  Plan: Okay for ERCP with anesthesia assistance  [facility]  Pager [phone] After 5PM or if no answer call [phone]

## 2016-12-20 ENCOUNTER — Inpatient Hospital Stay (HOSPITAL_COMMUNITY): Payer: No Typology Code available for payment source

## 2016-12-20 ENCOUNTER — Encounter (HOSPITAL_COMMUNITY): Payer: Self-pay | Admitting: *Deleted

## 2016-12-20 LAB — CBC WITH DIFFERENTIAL/PLATELET
BASOS PCT: 0 %
Basophils Absolute: 0 10*3/uL (ref 0.0–0.1)
EOS ABS: 0.1 10*3/uL (ref 0.0–0.7)
EOS PCT: 1 %
HCT: 29.9 % — ABNORMAL LOW (ref 39.0–52.0)
Hemoglobin: 9.2 g/dL — ABNORMAL LOW (ref 13.0–17.0)
LYMPHS ABS: 2.8 10*3/uL (ref 0.7–4.0)
Lymphocytes Relative: 25 %
MCH: 28.3 pg (ref 26.0–34.0)
MCHC: 30.8 g/dL (ref 30.0–36.0)
MCV: 92 fL (ref 78.0–100.0)
MONOS PCT: 8 %
Monocytes Absolute: 0.9 10*3/uL (ref 0.1–1.0)
Neutro Abs: 7.2 10*3/uL (ref 1.7–7.7)
Neutrophils Relative %: 66 %
PLATELETS: 657 10*3/uL — AB (ref 150–400)
RBC: 3.25 MIL/uL — AB (ref 4.22–5.81)
RDW: 14.7 % (ref 11.5–15.5)
WBC: 11 10*3/uL — AB (ref 4.0–10.5)

## 2016-12-20 LAB — LIPASE, BLOOD: LIPASE: 30 U/L (ref 11–51)

## 2016-12-20 LAB — COMPREHENSIVE METABOLIC PANEL
ALK PHOS: 102 U/L (ref 38–126)
ALT: 16 U/L — AB (ref 17–63)
AST: 19 U/L (ref 15–41)
Albumin: 2.2 g/dL — ABNORMAL LOW (ref 3.5–5.0)
Anion gap: 5 (ref 5–15)
CALCIUM: 7.9 mg/dL — AB (ref 8.9–10.3)
CHLORIDE: 108 mmol/L (ref 101–111)
CO2: 24 mmol/L (ref 22–32)
CREATININE: 0.98 mg/dL (ref 0.61–1.24)
Glucose, Bld: 103 mg/dL — ABNORMAL HIGH (ref 65–99)
Potassium: 3.6 mmol/L (ref 3.5–5.1)
Sodium: 137 mmol/L (ref 135–145)
Total Bilirubin: 0.8 mg/dL (ref 0.3–1.2)
Total Protein: 5.5 g/dL — ABNORMAL LOW (ref 6.5–8.1)

## 2016-12-20 MED ORDER — IOPAMIDOL (ISOVUE-300) INJECTION 61%
INTRAVENOUS | Status: AC
  Administered 2016-12-20: 100 mL
  Filled 2016-12-20: qty 100

## 2016-12-20 MED ORDER — HYDROMORPHONE HCL 1 MG/ML IJ SOLN
0.5000 mg | INTRAMUSCULAR | Status: DC | PRN
  Administered 2016-12-20 – 2016-12-25 (×18): 1 mg via INTRAVENOUS
  Filled 2016-12-20 (×20): qty 1

## 2016-12-20 MED ORDER — OXYCODONE HCL 5 MG PO TABS
5.0000 mg | ORAL_TABLET | ORAL | Status: DC | PRN
  Administered 2016-12-20 – 2016-12-25 (×18): 10 mg via ORAL
  Filled 2016-12-20 (×18): qty 2

## 2016-12-20 MED ORDER — METHOCARBAMOL 750 MG PO TABS
750.0000 mg | ORAL_TABLET | Freq: Four times a day (QID) | ORAL | Status: DC | PRN
  Administered 2016-12-20 – 2016-12-24 (×7): 750 mg via ORAL
  Filled 2016-12-20 (×7): qty 1

## 2016-12-20 NOTE — Progress Notes (Signed)
1 Day Post-Op    CC: Abdominal pain  Subjective: Complaining of some back pain and mid epigastric discomfort.  Walking and on a soft diet.  Drainage is much improved now.    Objective: Vital signs in last 24 hours: Temp:  [98 F (36.7 C)-98.7 F (37.1 C)] 98.6 F (37 C) (11/13 0502) Pulse Rate:  [80-94] 82 (11/13 0502) Resp:  [18] 18 (11/13 0502) BP: (114-123)/(69-79) 115/69 (11/13 0502) SpO2:  [95 %-100 %] 95 % (11/13 0502) Weight:  [90.5 kg (199 lb 8.3 oz)] 90.5 kg (199 lb 8.3 oz) (11/13 0500) Last BM Date: 12/18/16 760 Po 4000 IV 2400 urine  Drain 800, (160 last shift Wt stable Afebrile, VSS Labs OK  WBC 11.9 this AM Intake/Output from previous day: 11/12 0701 - 11/13 0700 In: 4810 [P.O.:760; I.V.:3900; IV Piggyback:150] Out: 3200 [Urine:2400; Drains:800] Intake/Output this shift: No intake/output data recorded.  General appearance: alert, cooperative and no distress Resp: clear to auscultation bilaterally GI: soft, sore mid abdomen and into back.    Lab Results:  Recent Labs    12/18/16 0433 12/20/16 0325  WBC 8.9 11.0*  HGB 9.2* 9.2*  HCT 30.2* 29.9*  PLT 658* 657*    BMET Recent Labs    12/18/16 0433 12/20/16 0325  NA 139 137  K 3.6 3.6  CL 109 108  CO2 25 24  GLUCOSE 87 103*  BUN <5* <5*  CREATININE 0.90 0.98  CALCIUM 8.0* 7.9*   PT/INR No results for input(s): LABPROT, INR in the last 72 hours.  Recent Labs  Lab 12/14/16 0350 12/17/16 0529 12/18/16 0433 12/20/16 0325  AST 22 22 16 19   ALT 25 30 22  16*  ALKPHOS 119 147* 116 102  BILITOT 0.7 1.1 0.8 0.8  PROT 5.7* 6.0* 5.6* 5.5*  ALBUMIN 2.1* 2.3* 2.2* 2.2*     Lipase     Component Value Date/Time   LIPASE 35 12/14/2016 0350     Medications: . acetaminophen  1,000 mg Oral Q8H  . docusate sodium  100 mg Oral BID  . heparin subcutaneous  5,000 Units Subcutaneous Q8H  . metoprolol succinate  25 mg Oral Q breakfast  . multivitamins with iron  1 tablet Oral QPC supper  .  pantoprazole  40 mg Oral Daily   Anti-infectives (From admission, onward)   Start     Dose/Rate Route Frequency Ordered Stop   12/07/16 0830  piperacillin-tazobactam (ZOSYN) IVPB 3.375 g     3.375 g 12.5 mL/hr over 240 Minutes Intravenous Every 8 hours 12/07/16 0736     12/02/16 1500  cefTRIAXone (ROCEPHIN) 2 g in dextrose 5 % 50 mL IVPB  Status:  Discontinued     2 g 100 mL/hr over 30 Minutes Intravenous Every 24 hours 12/02/16 1337 12/07/16 0736     . dextrose 5 % and 0.9 % NaCl with KCl 20 mEq/L 100 mL/hr at 12/19/16 2344  . methocarbamol (ROBAXIN)  IV Stopped (12/12/16 2200)  . piperacillin-tazobactam (ZOSYN)  IV Stopped (12/20/16 0900)    Assessment/Plan Gangrenous cholecystitis with cholelithiasis  Laparoscopic cholecystectomy, 19 French Blake drain placement, gangrenous changes involving the entirety of the back wall, short cystic duct precluded cholangiogram10/27/18, Dr. Violeta GelinasBurke Thompson CT 10/31: 19.5 cm fluid collection - Biloma/abscess 17.4 cm possible hematoma IR drain Placed 12/08/16, 1050 In drain on return from the floor and 2035 ml total yesterday.  Drain 360/24h ERCP attempt 11/6 Dr. Dulce Sellarutlaw - unable to do (food in stomach) ERCP attempt 11/8 Dr. Dulce Sellarutlaw -  unable to do (throat edema) ERCP 10Fr 7 cm drain placed, sphincterotomy   12/19/16, Dr. Ewing Schlein   Acute kidney injury: resolved  Anemia  RLL pleural effusion/atelectasis - IS  Active problems: Hypertension - Toprol -25mg  QD Hx of Borderline CHF - Grade 2 diastolic dysfunction/EF 44% Hyperlipidemia History of thyroid disease FEN: clears and go to fulls if he tolerates them.  ID: Rocephin 10/26 -10/31. Zosyn 10/31 =>>day 14 DVT: SCD's, SQ hep Follow up: Dr Janee Morn  Plan:  See how he does with the drainage today.  ? Stop antibiotics after today's dose.  Decrease IV fluids and discuss discharge criteria.          LOS: 17 days    Bobby Kelly 12/20/2016 [phone]

## 2016-12-20 NOTE — Discharge Summary (Addendum)
Physician Discharge Summary  Patient ID: Bobby Kelly MRN: [id number] DOB/AGE: [birth year] 35 y.o.  Admit date: 12/02/2016 Discharge date: 12/25/2016   Admission Diagnoses:  Acute on chronic cholecystitis/cholelithiasis. Hypertension Borderline CHF with mild cardiomyopathy EF 44% with grade 2 diastolic dysfunction.   Discharge Diagnoses:  Acute gangrenous cholecystitis. Postoperative bile leak with biloma. Postoperative bleeding from the site with anemia. Postoperative acute kidney injury -resolved Postop leukocytosis Hypertension Borderline CHF with mild cardiomyopathy EF 44% with grade 2 diastolic dysfunction. HCAP Pneumonia Large right pleural effusion   Principal Problem:   Acute calculous cholecystitis Active Problems:   Pleural effusion   HCAP (healthcare-associated pneumonia)   Biloma following surgery   CHF (congestive heart failure) (HCC)   Cholelithiasis   PROCEDURES: 1.   Laparoscopic cholecystectomy with Harrison Mons drain placement 12/03/16, Dr. Violeta Gelinas 2.  IR drain placement 12/08/16 3.  Attempted ERCP 12/13/16, Dr. Willis Modena. -  4.  Attempted ERCP 11/0 8/19, Dr. Willis Modena 5.  ERCP with placement of a 10 French 7 cm drain.  Sphincterotomy 12/19/16 Dr. Leary Roca. 6.  Repositioning of drains 12/23/16 7.  IR right thoracentesis  12/23/16      Hospital Course:   this is a 36 year old gentleman referred by the emergency department for symptomatic gallstones. He was actually seen there on October 20. At that time he had right upper quadrant tenderness with an elevated white blood count. An ultrasound showed gallstones. His liver function tests were slightly elevated as well. He apparently improved slightly and was discharged from the emergency department. Yesterday he underwent a HIDA scan showing nonvisualization of the gallbladder. He is here today complaining of right upper quadrant abdominal pain. He had an admission in August for chest pain and  was found to have a 44% cardiac ejection fraction.  Was seen in the emergency department and admitted by Dr. Magnus Ivan.  He was also seen by Dr. Claud Kelp at that point.  Was his opinion patient has smoldering acute cholecystitis with cholelithiasis.  Was subsequently scheduled and taken to the operating room the following a.m. by Dr. Violeta Gelinas. At that time he was found to have gangrenous cholecystitis with cholelithiasis.  Expiration showed a severely inflamed gallbladder completely covered with omentum which was also adherent to the anterior abdominal wall.  There was severe inflammation with some patches of gangrene.  Because of the severe inflammation he was unable to do a cholangiogram.  The back wall of the gallbladder was completely gangrenous and therefore was removed leaving part of the gangrenous back wall on the liver bed.  A 19 French drain was placed in the liver bed and brought out through a right lateral port.  He returned to the floor and was hemodynamically stable.  The a.m. of 1028 his creatinine was up to 1.42 LFTs were improving his white count was 20,500.  H/H on admission was 15.8/45.3.  On 10/28 it was down to 10/31, and on 10/29 H/H was 7.6/23.2.  Patient was typed and screened at this point and hemoglobin rechecked showed him stabilizing.  His WBC remained.  He showed minimal improvement and continued to feel terrible and we repeated his CT scan on 12/07/16.  This showed his recent cholecystectomy with thick walled loculated complex fluid collection in the right upper quadrant and gallbladder fossa with a mass-effect on the liver.  Superior component was measuring 19 cm was concerning for abscess or biloma.  He also had a small right pleural effusion.  IR was contacted and a drain  was placed on 12/08/16.  He continued to have a large volume of drainage output and on 11 4/18 a HIDA scan was obtained which showed he had a bile leak into a large collection alongside the right side of  the liver.  GI consult was obtained at this point.  He was seen by Dr. Willis ModenaWilliam Outlaw and subsequently scheduled for an ERCP the following day, 12/13/16.  Patient was taken to endoscopy at that point and EGD showed a large amount of fluid and stomach content.  The volume was enough that it precluded going any further with the study at that time.  And it was discontinued.  He was returned to the floor.  We kept him on clear liquids at that point and he was taken back to the endoscopy suite 12/15/16. EGD was performed but there was a fair amount of edema in the airway and the cricopharyngeus region.  The exam was stomach was normal duodenal bulb and first portion of the duodenum second portion of the duodenum were normal.  Because of the edema procedure was abandoned.  Patient was kept in the hospital And on 12/19/16 he was taken back to the endoscopy suite by Dr. Leary RocaMark Magod.  The major papilla appeared normal. - A bile leak was found. This was treated with a sphincterotomy and stenting.  A biliary sphincterotomy was performed. One plastic stent was placed into the common bile duct. there was no pancreatic duct injection or wire advancement throughout the procedure.  His drainage at this point is markedly reduced.  He was  tolerating a soft diet. He still had an elevated WBC, and fevers on 11/14, started back on antibiotics.  We would not find an abdominal source, and we were concerned that it was pulmonary.  Medicine consult obtained led to a CT of the chest, which showed a significant right pleural effusion, and confirmed HCAP diagnosis.  It also confirmed the perihepatic fluid collection was larger.  IR irrigated the drain, and agreed it was not working properly.  ON 11/16 he went back to IR for drain repositioning and right 800 cc thoracentesis.  ON 11/17 he was doing better, IR drain put out 200 cc, Breathing was better.  CBC was improved, H/H is low but stable  .  He was ready for discharge and sent home at that  time.  He was sent home on 5 more days of Augmentin.      He will follow-up with Dr. Leary RocaMark Magod and Dr. Violeta GelinasBurke Thompson.    CBC Latest Ref Rng & Units 12/25/2016 12/24/2016 12/23/2016  WBC 4.0 - 10.5 K/uL 8.6 10.3 13.5(H)  Hemoglobin 13.0 - 17.0 g/dL 10.0(L) 8.8(L) 9.3(L)  Hematocrit 39.0 - 52.0 % 32.2(L) 29.2(L) 30.6(L)  Platelets 150 - 400 K/uL 544(H) 494(H) 451(H)   CMP Latest Ref Rng & Units 12/25/2016 12/24/2016 12/23/2016  Glucose 65 - 99 mg/dL 95 97 161(W112(H)  BUN 6 - 20 mg/dL 5(L) 6 9  Creatinine 9.600.61 - 1.24 mg/dL 4.540.75 0.980.80 1.190.84  Sodium 135 - 145 mmol/L 135 133(L) 130(L)  Potassium 3.5 - 5.1 mmol/L 4.0 3.6 3.8  Chloride 101 - 111 mmol/L 103 100(L) 99(L)  CO2 22 - 32 mmol/L 25 26 24   Calcium 8.9 - 10.3 mg/dL 8.2(L) 7.5(L) 7.4(L)  Total Protein 6.5 - 8.1 g/dL 6.4(L) 5.5(L) -  Total Bilirubin 0.3 - 1.2 mg/dL 1.0 0.5 -  Alkaline Phos 38 - 126 U/L 114 108 -  AST 15 - 41 U/L 32 15 -  ALT 17 - 63 U/L 14(L) 12(L) -    Disposition: 01-Home or Self Care  Discharge Instructions    Discharge patient   Complete by:  As directed    Discharge disposition:  01-Home or Self Care   Discharge patient date:  12/25/2016   PICC line removal   Complete by:  As directed      Allergies as of 12/25/2016   No Known Allergies     Medication List    STOP taking these medications   oxyCODONE-acetaminophen 5-325 MG tablet Commonly known as:  PERCOCET/ROXICET     TAKE these medications   amoxicillin-clavulanate 875-125 MG tablet Commonly known as:  AUGMENTIN Take 1 tablet every 12 (twelve) hours for 5 days by mouth.   lisinopril 20 MG tablet Commonly known as:  PRINIVIL,ZESTRIL Take 1 tablet (20 mg total) by mouth daily.   metoprolol succinate 50 MG 24 hr tablet Commonly known as:  TOPROL-XL Take 1 tablet (50 mg total) by mouth daily. Take with or immediately following a meal.   ondansetron 4 MG disintegrating tablet Commonly known as:  ZOFRAN ODT Take 1 tablet (4 mg total) by  mouth every 8 (eight) hours as needed for nausea or vomiting.   oxyCODONE 5 MG immediate release tablet Commonly known as:  Oxy IR/ROXICODONE Take 1 tablet (5 mg total) every 4 (four) hours as needed by mouth for severe pain or breakthrough pain.   pravastatin 20 MG tablet Commonly known as:  PRAVACHOL Take 1 tablet (20 mg total) by mouth every evening.      Follow-up Information    Hoss, Arthur, MD Follow up in 1 week(s).   Specialties:  Interventional Radiology, Radiology Why:  follow up with Dtr Hoss 1 week---we will call pt with time and date; call [phone] if any needs Contact information: [address] [place] [address] [phone]        Violeta Gelinas, MD Follow up on 01/04/2017.   Specialty:  General Surgery Why:  Your appointment with Dr. Janee Morn is at 10:50 AM.  Be at the office 30 minutes early for check-in, bring your photo ID and insurance information. Contact information: [address] [place] [address] [phone]        Vida Rigger, MD. Schedule an appointment as soon as possible for a visit in 3 week(s).   Specialty:  Gastroenterology Contact information: [address] [place] [address] [phone]           Signed: Sherrie George 12/26/2016, 1:02 PM

## 2016-12-20 NOTE — Progress Notes (Signed)
-   Xray findings discussed with Dr. Jordan/radiology - There is concern for subdiaphragmatic gas  versus subpulmonic gas. Recommended CT scan for further evaluation. - CT abdomen pelvis with IV contrast ordered.  Kathi Der MD, FACP 12/20/2016, 3:34 PM  Contact #  [phone]

## 2016-12-20 NOTE — Anesthesia Postprocedure Evaluation (Signed)
Anesthesia Post Note  Patient: Bobby Kelly  Procedure(s) Performed: ENDOSCOPIC RETROGRADE CHOLANGIOPANCREATOGRAPHY (ERCP) (N/A )     Patient location during evaluation: PACU Anesthesia Type: General Level of consciousness: awake and alert Pain management: pain level controlled Vital Signs Assessment: post-procedure vital signs reviewed and stable Respiratory status: spontaneous breathing, nonlabored ventilation, respiratory function stable and patient connected to nasal cannula oxygen Cardiovascular status: blood pressure returned to baseline and stable Postop Assessment: no apparent nausea or vomiting Anesthetic complications: no    Last Vitals:  Vitals:   12/20/16 1239 12/20/16 1724  BP: 113/70 123/78  Pulse: 99 (!) 127  Resp: 18   Temp: 37.1 C 37.5 C  SpO2: 98% 96%    Last Pain:  Vitals:   12/20/16 1724  TempSrc: Oral  PainSc:                  [name] P Anahlia Iseminger

## 2016-12-20 NOTE — Progress Notes (Signed)
[facility] Gastroenterology Progress Note  Bobby Kelly 36 y.o. Mar 22, 1980  CC:  Bile leak   Subjective: ERCP yesterday with sphincterotomy and plastic stent placement. Patient is now complaining of bilateral lower extremity discomfort as well as right shoulder pain, back pain and mild epigastric discomfort. Denied any nausea vomiting today. No bowel movement today. Ambulating in the hallway  ROS : Negative for chest pain and shortness of breath   Objective: Vital signs in last 24 hours: Vitals:   12/19/16 2212 12/20/16 0502  BP: 114/77 115/69  Pulse: 80 82  Resp: 18 18  Temp: 98.7 F (37.1 C) 98.6 F (37 C)  SpO2: 100% 95%    Physical Exam:  General:  Alert, cooperative, no distress, appears stated age  Head:  Normocephalic, without obvious abnormality, atraumatic  Eyes:  , EOM's intact,   Lungs:   Clear to auscultation bilaterally, respirations unlabored  Heart:  Regular rate and rhythm, S1, S2 normal  Abdomen:   Soft, mild tenderness in epigastric as well as right upper quadrant area bowel sounds active all four quadrants,  no masses, no peritoneal signs   Extremities: Extremities normal, atraumatic, no  edema  Pulses: 2+ and symmetric    Lab Results: Recent Labs    12/18/16 0433 12/20/16 0325  NA 139 137  K 3.6 3.6  CL 109 108  CO2 25 24  GLUCOSE 87 103*  BUN <5* <5*  CREATININE 0.90 0.98  CALCIUM 8.0* 7.9*   Recent Labs    12/18/16 0433 12/20/16 0325  AST 16 19  ALT 22 16*  ALKPHOS 116 102  BILITOT 0.8 0.8  PROT 5.6* 5.5*  ALBUMIN 2.2* 2.2*   Recent Labs    12/18/16 0433 12/20/16 0325  WBC 8.9 11.0*  NEUTROABS 5.1 7.2  HGB 9.2* 9.2*  HCT 30.2* 29.9*  MCV 92.6 92.0  PLT 658* 657*   No results for input(s): LABPROT, INR in the last 72 hours.    Assessment/Plan: - Bile leak. Status post ERCP with sphincterotomy and plastic stent placement yesterday - Bilateral lower extremity discomfort along with right shoulder pain and back pain. Could be  musculoskeletal. - Epigastric and right upper quadrant discomfort.  Recommendations --------------------------- - X-ray abdomen for further evaluation. - Continue current management for now. - Follow-up in GI clinic in 3 weeks to discuss stent removal from common bile duct. - GI will follow  Kathi Der MD, FACP 12/20/2016, 10:44 AM  Contact #  [phone]

## 2016-12-20 NOTE — Discharge Instructions (Signed)
[facility] ______[facility] °LAPAROSCOPIC SURGERY: POST OP INSTRUCTIONS °Always review your discharge instruction sheet given to you by the facility where your surgery was performed. °IF YOU HAVE DISABILITY OR FAMILY LEAVE FORMS, YOU MUST BRING THEM TO THE OFFICE FOR PROCESSING.   °DO NOT GIVE THEM TO YOUR DOCTOR. ° °1. A prescription for pain medication may be given to you upon discharge.  Take your pain medication as prescribed, if needed.  If narcotic pain medicine is not needed, then you may take acetaminophen (Tylenol) or ibuprofen (Advil) as needed. °2. Take your usually prescribed medications unless otherwise directed. °3. If you need a refill on your pain medication, please contact your pharmacy.  They will contact our office to request authorization. Prescriptions will not be filled after 5pm or on week-ends. °4. You should follow a light diet the first few days after arrival home, such as soup and crackers, etc.  Be sure to include lots of fluids daily. °5. Most patients will experience some swelling and bruising in the area of the incisions.  Ice packs will help.  Swelling and bruising can take several days to resolve.  °6. It is common to experience some constipation if taking pain medication after surgery.  Increasing fluid intake and taking a stool softener (such as Colace) will usually help or prevent this problem from occurring.  A mild laxative (Milk of Magnesia or Miralax) should be taken according to package instructions if there are no bowel movements after 48 hours. °7. Unless discharge instructions indicate otherwise, you may remove your bandages 24-48 hours after surgery, and you may shower at that time.  You may have steri-strips (small skin tapes) in place directly over the incision.  These strips should be left on the skin for 7-10 days.  If your surgeon used skin glue on the incision, you may shower in 24 hours.  The glue will flake off over the next 2-3 weeks.  Any sutures or  staples will be removed at the office during your follow-up visit. °8. ACTIVITIES:  You may resume regular (light) daily activities beginning the next day--such as daily self-care, walking, climbing stairs--gradually increasing activities as tolerated.  You may have sexual intercourse when it is comfortable.  Refrain from any heavy lifting or straining until approved by your doctor. °a. You may drive when you are no longer taking prescription pain medication, you can comfortably wear a seatbelt, and you can safely maneuver your car and apply brakes. °b. RETURN TO WORK:  __________________________________________________________ °9. You should see your doctor in the office for a follow-up appointment approximately 2-3 weeks after your surgery.  Make sure that you call for this appointment within a day or two after you arrive home to insure a convenient appointment time. °10. OTHER INSTRUCTIONS: __________________________________________________________________________________________________________________________ __________________________________________________________________________________________________________________________ °WHEN TO CALL YOUR DOCTOR: °1. Fever over 101.0 °2. Inability to urinate °3. Continued bleeding from incision. °4. Increased pain, redness, or drainage from the incision. °5. Increasing abdominal pain ° °The clinic staff is available to answer your questions during regular business hours.  Please don’t hesitate to call and ask to speak to one of the nurses for clinical concerns.  If you have a medical emergency, go to the nearest emergency room or call 911.  A surgeon from [facility] is always on call at the hospital. °[address], [address], [place], [place]  [address] ? [address] [address], [place],    [address] °[phone] ? [phone] ? FAX [phone] °Web site:   [email or web address]   Percutaneous Abscess Drain  Measure and record the  drainage each day.  Keep a running log and bring with you to all your doctors appointments, and your appointment with interventional radiology. Irrigate the drain each day with 5 mils of sterile saline. Percutaneous abscess drain is removal of a collection of infected fluid inside the body (abscess). This is done by placing a thin needle under the skin and moving it into the abscess. A small tube (catheter) is inserted during the procedure and left in place for a few days to continue to drain the abscess. Tell a health care provider about:  Any allergies you have.  All medicines you are taking, including vitamins, herbs, eye drops, creams, and over-the-counter medicines.  Any problems you or family members have had with anesthetic medicines.  Any blood disorders you have.  Any surgeries you have had.  Any medical conditions you have.  Whether you are pregnant or may be pregnant.  Any history of tobacco use or smoking. What are the risks? Generally, this is a safe procedure. However, problems may occur, including:  Infection.  Bleeding.  Allergic reaction to medicines or materials used.  Damage to other structures or organs.  Blockage of the catheter, requiring placement of a new catheter.  A need to repeat the procedure.  Failure of the procedure to drain the abscess completely, requiring an open surgical procedure to drain the abscess. An open procedure is done through a larger incision.  What happens before the procedure? Medicines  Ask your health care provider about: ? Changing or stopping your regular medicines. This is especially important if you are taking diabetes medicines or blood thinners. ? Taking medicines such as aspirin and ibuprofen. These medicines can thin your blood. Do not take these medicines before your procedure if your health care provider instructs you not to. Staying hydrated Follow instructions from your health care provider about hydration,  which may include:  Up to 2 hours before the procedure - you may continue to drink clear liquids, such as water, clear fruit juice, black coffee, and plain tea.  Eating and drinking restrictions Follow instructions from your health care provider about eating and drinking, which may include:  8 hours before the procedure - stop eating heavy meals or foods such as meat, fried foods, or fatty foods.  6 hours before the procedure - stop eating light meals or foods, such as toast or cereal.  6 hours before the procedure - stop drinking milk or drinks that contain milk.  2 hours before the procedure - stop drinking clear liquids.  General instructions   Plan to have someone take you home from the hospital or clinic.  If you will be going home right after the procedure, plan to have someone with you for 24 hours.  You may have blood tests or urine tests.  You may get a tetanus shot.  You may have imaging tests, such as an ultrasound, to check how large or deep your abscess is. What happens during the procedure?  To lower your risk of infection: ? Your health care team will wash or sanitize their hands. ? The skin around the abscess will be washed with soap. ? Hair may be removed from the surgical site.  An IV tube will be inserted into one of your veins.  You will be given medicine to numb the area (local anesthetic) where the catheter will be placed. Placement of the catheter varies depending on where your abscess  is located.  You may be given medicine to help you relax (sedative) or medicine to make you fall asleep (general anesthetic).  A small incision will be made in your skin.  A needle will be inserted under your skin and moved into the abscess. Images from ultrasound, X-ray, or a CT scan will be used to help guide the needle to the abscess.  A catheter will be inserted into your incision and moved underneath your skin until it reaches the abscess. Images will be used to  help guide the catheter to the abscess.  After the catheter is in place, the needle will be removed. The catheter will be connected to a bag outside of your body. The catheter will stay in place until the fluid has stopped draining and the infection is gone. The procedure may vary among health care providers and hospitals. What happens after the procedure?  Your blood pressure, heart rate, breathing rate, and blood oxygen level will be monitored until the medicines you were given have worn off.  You may have some pain or nausea. Medicines will be available to help you. Summary  An abscess is a collection of infected fluid inside the body.  During this procedure, images from ultrasound, X-rays, or a CT scan are used to help guide the needle and catheter to the abscess.  A catheter will be left in place to continue to drain the abscess after the procedure. This information is not intended to replace advice given to you by your health care provider. Make sure you discuss any questions you have with your health care provider. Document Released: 06/10/2013 Document Revised: 12/17/2015 Document Reviewed: 12/17/2015 Elsevier Interactive Patient Education  2017 [employer] Inc[employer]   Endoscopic Retrograde Cholangiopancreatogram, Care After with stent placement This sheet gives you information about how to care for yourself after your procedure. Your health care provider may also give you more specific instructions. If you have problems or questions, contact your health care provider. What can I expect after the procedure? After the procedure, it is common to have:  Soreness in your throat.  Nausea.  Bloating.  Dizziness.  Tiredness (fatigue).  Follow these instructions at home:  Take over-the-counter and prescription medicines only as told by your health care provider.  Do not drive for 24 hours if you were given a medicine to help you relax (sedative) during your procedure. Have someone  stay with you for 24 hours after the procedure.  Return to your normal activities as told by your health care provider. Ask your health care provider what activities are safe for you.  Return to eating what you normally do as soon as you feel well enough or as told by your health care provider.  Keep all follow-up visits as told by your health care provider. This is important. Contact a health care provider if:  You have pain in your abdomen that does not get better with medicine.  You develop signs of infection, such as: ? Chills. ? Feeling unwell. Get help right away if:  You have difficulty swallowing.  You have worsening pain in your throat, chest, or abdomen.  You vomit bright red blood or a substance that looks like coffee grounds.  You have bloody or very black stools.  You have a fever.  You have a sudden increase in swelling (bloating) in your abdomen. Summary  After the procedure, it is common to feel tired and to have some discomfort in your throat.  Contact your health care provider  if you have signs of infection--such as chills or feeling unwell--or if you have pain that does not improve with medicine.  Get help right away if you have trouble swallowing, worsening pain, bloody or black vomit, bloody or black stools, a fever, or increased swelling in your abdomen.  Keep all follow-up visits as told by your health care provider. This is important. This information is not intended to replace advice given to you by your health care provider. Make sure you discuss any questions you have with your health care provider. Document Released: 11/14/2012 Document Revised: 12/14/2015 Document Reviewed: 12/14/2015 Elsevier Interactive Patient Education  2017 [employer]

## 2016-12-20 NOTE — Progress Notes (Signed)
Referring Physician(s): Dr Elwyn Lade  Supervising Physician: Richarda Overlie  Patient Status:  [facility] - In-pt  Chief Complaint:  Gangrenous cholecystitis with cholelithiasis   Subjective:  11/1: IMPRESSION: Successful CT-guided perihepatic abscess drain yielding bilious Fluid.  Eating soft foods today Doing well  11/12 note: ERCP yesterday with sphincterotomy and plastic stent placement.  To follow with Dr Levora Angel 3 weeks per note  IR perihepatic drain intact Placed 11/1 No growth Cx 20 cc yesterday; 20 cc today Bilious OP---slowing   Allergies: Patient has no known allergies.  Medications: Prior to Admission medications   Medication Sig Start Date End Date Taking? Authorizing Provider  lisinopril (PRINIVIL,ZESTRIL) 20 MG tablet Take 1 tablet (20 mg total) by mouth daily. 10/17/16 01/15/17 Yes Camnitz, Will Daphine Deutscher, MD  metoprolol succinate (TOPROL-XL) 50 MG 24 hr tablet Take 1 tablet (50 mg total) by mouth daily. Take with or immediately following a meal. 10/17/16  Yes Camnitz, Will Daphine Deutscher, MD  ondansetron (ZOFRAN ODT) 4 MG disintegrating tablet Take 1 tablet (4 mg total) by mouth every 8 (eight) hours as needed for nausea or vomiting. 11/26/16  Yes Khatri, Hina, PA-C  oxyCODONE-acetaminophen (PERCOCET/ROXICET) 5-325 MG tablet Take 1 tablet by mouth every 8 (eight) hours as needed for severe pain. 11/26/16  Yes Khatri, Hina, PA-C  pravastatin (PRAVACHOL) 20 MG tablet Take 1 tablet (20 mg total) by mouth every evening. 11/07/16 11/07/17 Yes Tysinger, Kermit Balo, PA-C     Vital Signs: BP 113/70 (BP Location: Right Arm)   Pulse 99   Temp 98.7 F (37.1 C) (Oral)   Resp 18   Ht 5\' 5"  (1.651 m)   Wt 199 lb 8.3 oz (90.5 kg)   SpO2 98%   BMI 33.20 kg/m   Physical Exam  Constitutional: He is oriented to person, place, and time.  Musculoskeletal: Normal range of motion.  Neurological: He is alert and oriented to person, place, and time.  Skin: Skin is warm and dry.  IR  drain to gravity OP bile Slowing OP No growth Site is clean and dry    Imaging: Dg Ercp Biliary & Pancreatic Ducts  Result Date: 12/19/2016 CLINICAL DATA:  Anastomotic leak of biliary tree. History of cholecystectomy. EXAM: ERCP TECHNIQUE: Multiple spot images obtained with the fluoroscopic device and submitted for interpretation post-procedure. FLUOROSCOPY TIME:  Fluoroscopy Time:  146 seconds Number of Acquired Spot Images: 7 COMPARISON:  CT 11/09/2016 FINDINGS: Cannulation and opacification of the extrahepatic biliary system. There is a pigtail drain and a surgical drain present. The biliary system is non dilated. There appears to be contrast within the cystic duct and filling of an irregular space near the pigtail drain. Findings are compatible with a biliary leak which could be originating from or near the cystic duct. A nonmetallic biliary stent was placed in the distal common bile duct. IMPRESSION: Evidence for a bile leak. Placement of a nonmetallic biliary stent. These images were submitted for radiologic interpretation only. Please see the procedural report for the amount of contrast and the fluoroscopy time utilized. Electronically Signed   By: Richarda Overlie M.D.   On: 12/19/2016 09:18   Dg Abd Acute W/chest  Result Date: 12/20/2016 CLINICAL DATA:  Abdominal pain and nausea. Previous cholecystectomy and appendectomy. EXAM: DG ABDOMEN ACUTE W/ 1V CHEST COMPARISON:  Chest x-ray of December 09, 2014 FINDINGS: There is new volume loss at the right lung base. The right upper lobe is clear. The left lung is clear. There is no pulmonary vascular  congestion. The cardiac silhouette is mildly enlarged but likely stable. Within the abdomen there is a pigtail drainage catheter in the right upper quadrant. There is a common bile duct stent in place. There is an additional drainage catheter which terminates superior to the superior margin of the common bile duct stent. There is a trace of gas in the right  upper quadrant which may lie within the subdiaphragmatic region or subpulmonic region. The bowel gas pattern is normal. There surgical clips in the gallbladder fossa. There is gentle spinal curvature convex toward the left centered at L1. IMPRESSION: Drainage tubes in place in the right upper quadrant. Possible small amount of subdiaphragmatic gas versus subpulmonic gas. New increased density at the right lung base consistent with atelectasis or pneumonia. Electronically Signed   By: David  SwazilandJordan M.D.   On: 12/20/2016 14:07    Labs:  CBC: Recent Labs    12/14/16 0350 12/17/16 0529 12/18/16 0433 12/20/16 0325  WBC 15.3* 11.5* 8.9 11.0*  HGB 8.7* 9.8* 9.2* 9.2*  HCT 28.1* 32.7* 30.2* 29.9*  PLT 754* 735* 658* 657*    COAGS: Recent Labs    12/08/16 0747  INR 1.08    BMP: Recent Labs    12/14/16 0350 12/17/16 0529 12/18/16 0433 12/20/16 0325  NA 136 137 139 137  K 4.2 3.7 3.6 3.6  CL 107 106 109 108  CO2 25 25 25 24   GLUCOSE 112* 90 87 103*  BUN 7 <5* <5* <5*  CALCIUM 7.8* 8.1* 8.0* 7.9*  CREATININE 0.94 0.90 0.90 0.98  GFRNONAA >60 >60 >60 >60  GFRAA >60 >60 >60 >60    LIVER FUNCTION TESTS: Recent Labs    12/14/16 0350 12/17/16 0529 12/18/16 0433 12/20/16 0325  BILITOT 0.7 1.1 0.8 0.8  AST 22 22 16 19   ALT 25 30 22  16*  ALKPHOS 119 147* 116 102  PROT 5.7* 6.0* 5.6* 5.5*  ALBUMIN 2.1* 2.3* 2.2* 2.2*    Assessment and Plan:  Bile leak post gangrenous cholecystitis surgery IR drain placed 11/1 Slowing now after ERCP/stent placed 11/12 Home soon We will follow in IR OP drain clinic Flush daily-- 5 cc sterile saline Orders in place He will hear from scheduler for time and date   Electronically Signed: Evalynne Locurto A, PA-C 12/20/2016, 2:23 PM   I spent a total of 15 Minutes at the the patient's bedside AND on the patient's hospital floor or unit, greater than 50% of which was counseling/coordinating care for perihepatic bile leak drain  placement

## 2016-12-21 ENCOUNTER — Encounter (HOSPITAL_COMMUNITY): Payer: Self-pay | Admitting: Gastroenterology

## 2016-12-21 ENCOUNTER — Inpatient Hospital Stay (HOSPITAL_COMMUNITY): Payer: No Typology Code available for payment source

## 2016-12-21 LAB — COMPREHENSIVE METABOLIC PANEL
ALBUMIN: 2.2 g/dL — AB (ref 3.5–5.0)
ALK PHOS: 109 U/L (ref 38–126)
ALT: 16 U/L — ABNORMAL LOW (ref 17–63)
ALT: 16 U/L — ABNORMAL LOW (ref 17–63)
ANION GAP: 6 (ref 5–15)
ANION GAP: 8 (ref 5–15)
AST: 20 U/L (ref 15–41)
AST: 19 U/L (ref 15–41)
Albumin: 2.3 g/dL — ABNORMAL LOW (ref 3.5–5.0)
Alkaline Phosphatase: 120 U/L (ref 38–126)
BILIRUBIN TOTAL: 0.8 mg/dL (ref 0.3–1.2)
BUN: 5 mg/dL — AB (ref 6–20)
BUN: 6 mg/dL (ref 6–20)
CALCIUM: 7.9 mg/dL — AB (ref 8.9–10.3)
CHLORIDE: 100 mmol/L — AB (ref 101–111)
CO2: 25 mmol/L (ref 22–32)
CO2: 23 mmol/L (ref 22–32)
Calcium: 8.1 mg/dL — ABNORMAL LOW (ref 8.9–10.3)
Chloride: 103 mmol/L (ref 101–111)
Creatinine, Ser: 0.84 mg/dL (ref 0.61–1.24)
Creatinine, Ser: 0.93 mg/dL (ref 0.61–1.24)
GFR calc Af Amer: >60 mL/min (ref >60)
GFR calc non Af Amer: >60 mL/min (ref >60)
GLUCOSE: 93 mg/dL (ref 65–99)
Glucose, Bld: 138 mg/dL — ABNORMAL HIGH (ref 65–99)
POTASSIUM: 3.8 mmol/L (ref 3.5–5.1)
Potassium: 4 mmol/L (ref 3.5–5.1)
Sodium: 134 mmol/L — ABNORMAL LOW (ref 135–145)
Sodium: 131 mmol/L — ABNORMAL LOW (ref 135–145)
TOTAL PROTEIN: 5.8 g/dL — AB (ref 6.5–8.1)
Total Bilirubin: 0.7 mg/dL (ref 0.3–1.2)
Total Protein: 6.4 g/dL — ABNORMAL LOW (ref 6.5–8.1)

## 2016-12-21 LAB — CBC
HEMATOCRIT: 34.2 % — AB (ref 39.0–52.0)
HEMOGLOBIN: 10.6 g/dL — AB (ref 13.0–17.0)
MCH: 28.3 pg (ref 26.0–34.0)
MCHC: 31 g/dL (ref 30.0–36.0)
MCV: 91.4 fL (ref 78.0–100.0)
Platelets: 585 10*3/uL — ABNORMAL HIGH (ref 150–400)
RBC: 3.74 MIL/uL — ABNORMAL LOW (ref 4.22–5.81)
RDW: 14.9 % (ref 11.5–15.5)
WBC: 13.4 10*3/uL — ABNORMAL HIGH (ref 4.0–10.5)

## 2016-12-21 LAB — LIPASE, BLOOD: LIPASE: 17 U/L (ref 11–51)

## 2016-12-21 MED ORDER — VANCOMYCIN HCL IN DEXTROSE 1-5 GM/200ML-% IV SOLN
1000.0000 mg | Freq: Three times a day (TID) | INTRAVENOUS | Status: DC
  Administered 2016-12-21 – 2016-12-24 (×9): 1000 mg via INTRAVENOUS
  Filled 2016-12-21 (×10): qty 200

## 2016-12-21 MED ORDER — FUROSEMIDE 20 MG PO TABS
20.0000 mg | ORAL_TABLET | Freq: Once | ORAL | Status: AC
  Administered 2016-12-21: 20 mg via ORAL
  Filled 2016-12-21: qty 1

## 2016-12-21 MED ORDER — AMOXICILLIN-POT CLAVULANATE 875-125 MG PO TABS
1.0000 | ORAL_TABLET | Freq: Two times a day (BID) | ORAL | Status: DC
  Administered 2016-12-21: 1 via ORAL
  Filled 2016-12-21: qty 1

## 2016-12-21 MED ORDER — PIPERACILLIN-TAZOBACTAM 3.375 G IVPB
3.3750 g | Freq: Three times a day (TID) | INTRAVENOUS | Status: DC
  Administered 2016-12-21 – 2016-12-25 (×11): 3.375 g via INTRAVENOUS
  Filled 2016-12-21 (×14): qty 50

## 2016-12-21 NOTE — Progress Notes (Signed)
2 Days Post-Op    CC:  Abdominal pain  Subjective: Still complaining of pain RUQ and and into his back.  Feels distended.  Now with low grade fever and increasing WBC.  Ct is pending, done last PM.  IR drain green fluid, JP has old serosanguinous fluid.  Just sitting up in bed today is pretty uncomfortable.  Objective: Vital signs in last 24 hours: Temp:  [98.7 F (37.1 C)-100.4 F (38 C)] 100.4 F (38 C) (11/14 0514) Pulse Rate:  [99-127] 101 (11/14 0514) Resp:  [16-18] 16 (11/13 2247) BP: (113-123)/(64-78) 116/64 (11/14 0514) SpO2:  [93 %-98 %] 95 % (11/14 0514) Last BM Date: 12/18/16 600 PO 1625 urine Drain  45 cc Tm 100.4, HR up Na down some 134, LFT's OK WBC is up to 13.4 Film yesterday shows volume loss RLL/trace of gas in the RUQ: ? subdiaphragmatic gas:  CT pending Intake/Output from previous day: 11/13 0701 - 11/14 0700 In: 600 [P.O.:600] Out: 1670 [Urine:1625; Drains:45] Intake/Output this shift: No intake/output data recorded.  General appearance: alert, cooperative, no distress and sore, and looks like he still feels bad this AM.   Resp: clear to auscultation bilaterally and ? down some right base, few rales base on the left. GI: he still seems distended, and tight.  tender RUQ, and just moving in the bed, say pain goes to back and spine.    Lab Results:  Recent Labs    12/20/16 0325 12/21/16 0500  WBC 11.0* 13.4*  HGB 9.2* 10.6*  HCT 29.9* 34.2*  PLT 657* 585*    BMET Recent Labs    12/20/16 0325 12/21/16 0500  NA 137 134*  K 3.6 4.0  CL 108 103  CO2 24 25  GLUCOSE 103* 93  BUN <5* 5*  CREATININE 0.98 0.84  CALCIUM 7.9* 7.9*   PT/INR No results for input(s): LABPROT, INR in the last 72 hours.  Recent Labs  Lab 12/17/16 0529 12/18/16 0433 12/20/16 0325 12/21/16 0500  AST 22 16 19 20   ALT 30 22 16* 16*  ALKPHOS 147* 116 102 109  BILITOT 1.1 0.8 0.8 0.8  PROT 6.0* 5.6* 5.5* 5.8*  ALBUMIN 2.3* 2.2* 2.2* 2.2*     Lipase      Component Value Date/Time   LIPASE 30 12/20/2016 0325     Medications: . acetaminophen  1,000 mg Oral Q8H  . docusate sodium  100 mg Oral BID  . heparin subcutaneous  5,000 Units Subcutaneous Q8H  . metoprolol succinate  25 mg Oral Q breakfast  . multivitamins with iron  1 tablet Oral QPC supper  . pantoprazole  40 mg Oral Daily    Anti-infectives (From admission, onward)   Start     Dose/Rate Route Frequency Ordered Stop   12/07/16 0830  piperacillin-tazobactam (ZOSYN) IVPB 3.375 g     3.375 g 12.5 mL/hr over 240 Minutes Intravenous Every 8 hours 12/07/16 0736 12/20/16 2359   12/02/16 1500  cefTRIAXone (ROCEPHIN) 2 g in dextrose 5 % 50 mL IVPB  Status:  Discontinued     2 g 100 mL/hr over 30 Minutes Intravenous Every 24 hours 12/02/16 1337 12/07/16 0736      Assessment/Plan  Gangrenous cholecystitis with cholelithiasis  Laparoscopic cholecystectomy, 19 French Blake drain placement, gangrenous changes involving the entirety of the back wall, short cystic duct precluded cholangiogram10/27/18, Dr. Violeta Gelinas CT 10/31: 19.5 cm fluid collection - Biloma/abscess 17.4 cm possible hematoma IR drain Placed 12/08/16, 1050 In drain on return from  the floor and 2035 ml total yesterday.  Drain 360/24h ERCP attempt 11/6 Dr. Dulce Sellarutlaw - unable to do (food in stomach) ERCP attempt 11/8 Dr. Dulce Sellarutlaw - unable to do (throat edema) ERCP 10Fr 7 cm drain placed, sphincterotomy 12/19/16, Dr. Ewing SchleinMagod   Acute kidney injury: resolved  Anemia  RLL pleural effusion/atelectasis - IS  Active problems: Hypertension - Toprol -25mg  QD Hx of Borderline CHF - Grade 2 diastolic dysfunction/EF 44% Hyperlipidemia History of thyroid disease FEN: soft diet ID: Rocephin 10/26 -10/31. Zosyn 10/31- 11/13 day 14 stopped last PM DVT: SCD's, SQ hep Follow up: Dr Janee Mornhompson   Plan: CT ordered last PM by Dr. Levora AngelBrahmbhatt, has been done, but not read so far.  Will review with Dr. Corliss Skainssuei when we have  results.         LOS: 18 days    Bobby Kelly 12/21/2016 [phone]

## 2016-12-21 NOTE — Progress Notes (Signed)
[facility] Progress Note  Bobby Kelly 36 y.o. Nov 04, 1980  CC:  Bile leak   Subjective:  Feeling somewhat better today.Tolerating diet. Continues to have right upper quadrant discomfort     Objective: Vital signs in last 24 hours: Vitals:   12/20/16 2247 12/21/16 0514  BP: 118/67 116/64  Pulse: (!) 105 (!) 101  Resp: 16   Temp: 99.6 F (37.6 C) (!) 100.4 F (38 C)  SpO2: 93% 95%    Physical Exam:  General:  Alert, cooperative, no distress, appears stated age  Head:  Normocephalic, without obvious abnormality, atraumatic  Eyes:  , EOM's intact,   Lungs:   Clear to auscultation bilaterally, respirations unlabored  Heart:  Regular rate and rhythm, S1, S2 normal  Abdomen:   Soft, mild tenderness at  right upper quadrant area bowel sounds active all four quadrants,  no masses, no peritoneal signs   Extremities: Extremities normal, atraumatic, no  edema  Pulses: 2+ and symmetric    Lab Results: Recent Labs    12/20/16 0325 12/21/16 0500  NA 137 134*  K 3.6 4.0  CL 108 103  CO2 24 25  GLUCOSE 103* 93  BUN <5* 5*  CREATININE 0.98 0.84  CALCIUM 7.9* 7.9*   Recent Labs    12/20/16 0325 12/21/16 0500  AST 19 20  ALT 16* 16*  ALKPHOS 102 109  BILITOT 0.8 0.8  PROT 5.5* 5.8*  ALBUMIN 2.2* 2.2*   Recent Labs    12/20/16 0325 12/21/16 0500  WBC 11.0* 13.4*  NEUTROABS 7.2  --   HGB 9.2* 10.6*  HCT 29.9* 34.2*  MCV 92.0 91.4  PLT 657* 585*   No results for input(s): LABPROT, INR in the last 72 hours.    Assessment/Plan: - Bile leak. Status post ERCP with sphincterotomy and plastic stent placement 12/19/2016   - Epigastric and right upper quadrant discomfort.improving - right pleural effusion  Recommendations --------------------------- - CT report reviewed.  - No further inpatient workup from GI standpoint. - follow-up with Dr. Ewing Schlein in 3 weeks to discuss removal of CBD stent. - okay to advance diet from GI standpoint - GI will sign  off. Call us back if needed  Kathi Der MD, FACP 12/21/2016, 2:02 PM  Contact #  [phone]

## 2016-12-21 NOTE — Progress Notes (Signed)
Spoke with Dr. Miles Costain regarding new CT scan.  Both drains are draining; however, they have less output now s/p ERCP as expected.  All fluid collections connection on the CT scan.  Dr. Miles Costain does not recommend any new drains at this time.  Continue current care.  He is already set up to see IR int he drain clinic once he is discharged from here.  Crystol Walpole E 10:09 AM 12/21/2016

## 2016-12-21 NOTE — Progress Notes (Addendum)
Patient ID: Bobby Kelly, male   DOB: October 30, 1980, 36 y.o.   MRN: [id number] Tachycardia with fever and some sob. Vitals with tachycardia (has been this way previously so this is not new), sats are fine, bp fine, fever to 102.  Tylenol given. cxr with some atelectasis, ? Infiltrate, right pleural effusion, question of lucency on right but has ct from today that was fine so will not proceed further now.  Will place on empiric abx with high fever and increasing wbc today.  Check lipase

## 2016-12-21 NOTE — Progress Notes (Signed)
Pharmacy Antibiotic Note  Bobby Kelly is a 36 y.o. male with gangrenous cholecystitis s/p ERCP 11/12 with drains in place, who was transitioned from zosyn to augmentin yesterday, now with fever and increasing WBC. Antibiotics will be broadened to vancomycin and zosyn. Renal function stable.  Vancomycin trough goal 15-20  Plan: 1) Vancomycin 1g IV q8 2) Zosyn 3.375g IV q8 (EI) 3) Follow renal function, cultures, LOT, level if needed  Height: 5\' 5"  (165.1 cm) Weight: 199 lb 8.3 oz (90.5 kg) IBW/kg (Calculated) : 61.5  Temp (24hrs), Avg:100.3 F (37.9 C), Min:99.1 F (37.3 C), Max:102.5 F (39.2 C)  Recent Labs  Lab 12/17/16 0529 12/18/16 0433 12/20/16 0325 12/21/16 0500  WBC 11.5* 8.9 11.0* 13.4*  CREATININE 0.90 0.90 0.98 0.84    Estimated Creatinine Clearance: 128.1 mL/min (by C-G formula based on SCr of 0.84 mg/dL).    No Known Allergies  Antimicrobials this admission: CTX 11/26 >> 11/30 Zosyn 11/30 >> 11/13, resume 11/14 >> Augmentin 11/14 x 1 dose Vancomycin 11/14 >>  Dose adjustments this admission: n/a  Microbiology results: 11/1 abd cx - negative 11/4 BCx - ngtd  Thank you for allowing pharmacy to be a part of this patient's care.  Fredrik Rigger 12/21/2016 7:02 PM

## 2016-12-22 ENCOUNTER — Encounter (HOSPITAL_COMMUNITY): Payer: Self-pay | Admitting: Internal Medicine

## 2016-12-22 ENCOUNTER — Inpatient Hospital Stay (HOSPITAL_COMMUNITY): Payer: No Typology Code available for payment source

## 2016-12-22 DIAGNOSIS — J189 Pneumonia, unspecified organism: Secondary | ICD-10-CM | POA: Diagnosis present

## 2016-12-22 DIAGNOSIS — K8020 Calculus of gallbladder without cholecystitis without obstruction: Secondary | ICD-10-CM | POA: Diagnosis present

## 2016-12-22 DIAGNOSIS — J90 Pleural effusion, not elsewhere classified: Secondary | ICD-10-CM | POA: Diagnosis present

## 2016-12-22 DIAGNOSIS — I509 Heart failure, unspecified: Secondary | ICD-10-CM

## 2016-12-22 DIAGNOSIS — I429 Cardiomyopathy, unspecified: Secondary | ICD-10-CM

## 2016-12-22 LAB — URINALYSIS, ROUTINE W REFLEX MICROSCOPIC
BACTERIA UA: NONE SEEN
GLUCOSE, UA: NEGATIVE mg/dL
Hgb urine dipstick: NEGATIVE
Ketones, ur: NEGATIVE mg/dL
Leukocytes, UA: NEGATIVE
Nitrite: NEGATIVE
PROTEIN: 100 mg/dL — AB
SQUAMOUS EPITHELIAL / LPF: NONE SEEN
Specific Gravity, Urine: 1.043 — ABNORMAL HIGH (ref 1.005–1.030)
pH: 5 (ref 5.0–8.0)

## 2016-12-22 LAB — BASIC METABOLIC PANEL
ANION GAP: 7 (ref 5–15)
BUN: 6 mg/dL (ref 6–20)
CHLORIDE: 100 mmol/L — AB (ref 101–111)
CO2: 26 mmol/L (ref 22–32)
Calcium: 7.9 mg/dL — ABNORMAL LOW (ref 8.9–10.3)
Creatinine, Ser: 0.9 mg/dL (ref 0.61–1.24)
GFR calc Af Amer: >60 mL/min (ref >60)
GFR calc non Af Amer: >60 mL/min (ref >60)
GLUCOSE: 118 mg/dL — AB (ref 65–99)
POTASSIUM: 3.8 mmol/L (ref 3.5–5.1)
SODIUM: 133 mmol/L — AB (ref 135–145)

## 2016-12-22 LAB — CBC
HEMATOCRIT: 32.9 % — AB (ref 39.0–52.0)
HEMOGLOBIN: 10 g/dL — AB (ref 13.0–17.0)
MCH: 27.8 pg (ref 26.0–34.0)
MCHC: 30.4 g/dL (ref 30.0–36.0)
MCV: 91.4 fL (ref 78.0–100.0)
Platelets: 519 10*3/uL — ABNORMAL HIGH (ref 150–400)
RBC: 3.6 MIL/uL — ABNORMAL LOW (ref 4.22–5.81)
RDW: 14.7 % (ref 11.5–15.5)
WBC: 13.8 10*3/uL — ABNORMAL HIGH (ref 4.0–10.5)

## 2016-12-22 LAB — LACTIC ACID, PLASMA
LACTIC ACID, VENOUS: 1 mmol/L (ref 0.5–1.9)
LACTIC ACID, VENOUS: 1.3 mmol/L (ref 0.5–1.9)

## 2016-12-22 LAB — STREP PNEUMONIAE URINARY ANTIGEN: STREP PNEUMO URINARY ANTIGEN: NEGATIVE

## 2016-12-22 MED ORDER — HEPARIN SODIUM (PORCINE) 5000 UNIT/ML IJ SOLN
5000.0000 [IU] | Freq: Three times a day (TID) | INTRAMUSCULAR | Status: DC
  Administered 2016-12-24 – 2016-12-25 (×3): 5000 [IU] via SUBCUTANEOUS
  Filled 2016-12-22 (×4): qty 1

## 2016-12-22 MED ORDER — IBUPROFEN 600 MG PO TABS
600.0000 mg | ORAL_TABLET | Freq: Once | ORAL | Status: AC
  Administered 2016-12-22: 600 mg via ORAL
  Filled 2016-12-22: qty 1

## 2016-12-22 NOTE — Consult Note (Signed)
[facility] Medical Consultation  Bobby Kelly WUJ:[id number] DOB: 06-10-1980 DOA: 12/02/2016 PCP: Jac Canavanysinger, David S, PA-C   Requesting physician: will Marlyne Beardsjennings PA general surgery Date of consultation: 12/22/16 Reason for consultation: fever/sob  Impression/Recommendations Principal Problem:   HCAP (healthcare-associated pneumonia) Active Problems:   Acute calculous cholecystitis   Pleural effusion   CHF (congestive heart failure) (HCC)   Cholelithiasis  #1. Healthcare associated pneumonia. Chest x-ray with worsening infiltrate on the left. Leukocytosis trending up. Max temp 102 tachycardia  no tachypnea no hypoxia. Nontoxic appearing. Vanco and Zosyn initiated by general surgery -Follow blood cultures -Obtain sputum culture -Strep pneumo urine antigen -Monitor oxygen saturation level  #2. Pleural effusion versus pneumoperitoneum. Chest x-ray yesterday as noted above. -Antibiotics as noted above -We'll obtain a CT of the chest -Monitor oxygen saturation level  #3. CHF. Recent chest x-ray with cardiomegaly and vascular congestion. He was provided with Lasix 20 mg yesterday. Chart review indicates 2 months ago underwent nuclear stress test which revealed EF of 35-40% and grade 2 diastolic dysfunction. home medications include lisinopril and metoprolol. Appears compensated -daily weights -intake and output -continue BB -monitor    TRH will follow up tomorrow.  Please contact if need assistance in the meanwhile. Thank you for this consultation.  Chief Complaint: fever  HPI:  Bobby Kelly is a very pleasant 35 admitted 2 weeks ago with gangrenous cholecystitis status post cholecystectomy who developed a bile leak and underwent ERCP with drains and stents 3 days ago. In addition interventional radiology place perihepatic drain for abscess prior to that. Last 24 hours patient is developed intermittent fevers hypertension leukocytosis and tachycardia. [facility] asked to  consult  Formation obtained from the patient. He reports feeling "much better than yesterday". He states he's able to take in some nourishment and the pain in his abdomen is improving. He does endorse some dyspnea with exertion. Denies cough, chills, nausea or vomiting. Had BM yesterday that was normal in color and consistency. No dysuria hematuria frequency or urgency. No chest pain palpitation lower extremity edema or orthopnea. No headache dizziness syncope or near-syncope.  Review of Systems:  10 point review of systems complete and all systems negative except as indicated in the history of present illness  Past Medical History:  Diagnosis Date  . Allergy   . CHF (congestive heart failure) (HCC)   . Cholelithiasis   . Dry skin    nasal folds  . HTN (hypertension)   . Hypertension   . Thyroid disease    hyperactive in high school, had oral therapy.    . Wears contact lenses    Past Surgical History:  Procedure Laterality Date  . APPENDECTOMY    . CHOLECYSTECTOMY N/A 12/03/2016   Procedure: LAPAROSCOPIC CHOLECYSTECTOMY;  Surgeon: Violeta Gelinashompson, Burke, MD;  [facility];  Service: General;  Laterality: N/A;  . ERCP N/A 12/19/2016   Procedure: ENDOSCOPIC RETROGRADE CHOLANGIOPANCREATOGRAPHY (ERCP);  Surgeon: Vida RiggerMagod, Marc, MD;  [facility];  Service: Endoscopy;  Laterality: N/A;  . ESOPHAGOGASTRODUODENOSCOPY (EGD) WITH PROPOFOL N/A 12/13/2016   Procedure: ESOPHAGOGASTRODUODENOSCOPY (EGD) WITH PROPOFOL;  Surgeon: Willis Modenautlaw, William, MD;  [facility];  Service: Endoscopy;  Laterality: N/A;  . ESOPHAGOGASTRODUODENOSCOPY (EGD) WITH PROPOFOL N/A 12/15/2016   Procedure: ESOPHAGOGASTRODUODENOSCOPY (EGD) WITH PROPOFOL;  Surgeon: Willis Modenautlaw, William, MD;  [facility];  Service: Endoscopy;  Laterality: N/A;   Social History:  reports that  has never smoked. he has never used smokeless tobacco. He reports that he drinks about 2.4 oz of alcohol per  week. He reports that he does  not use drugs.  No Known Allergies Family History  Problem Relation Age of Onset  . Hypertension Mother   . Cataracts Mother   . Hyperlipidemia Father   . Pancreatic cancer Maternal Grandmother   . Cancer Maternal Grandmother   . Diabetes Other   . Heart disease Neg Hx   . Stroke Neg Hx     Prior to Admission medications   Medication Sig Start Date End Date Taking? Authorizing Provider  lisinopril (PRINIVIL,ZESTRIL) 20 MG tablet Take 1 tablet (20 mg total) by mouth daily. 10/17/16 01/15/17 Yes Camnitz, Will Daphine Deutscher, MD  metoprolol succinate (TOPROL-XL) 50 MG 24 hr tablet Take 1 tablet (50 mg total) by mouth daily. Take with or immediately following a meal. 10/17/16  Yes Camnitz, Will Daphine Deutscher, MD  ondansetron (ZOFRAN ODT) 4 MG disintegrating tablet Take 1 tablet (4 mg total) by mouth every 8 (eight) hours as needed for nausea or vomiting. 11/26/16  Yes Khatri, Hina, PA-C  oxyCODONE-acetaminophen (PERCOCET/ROXICET) 5-325 MG tablet Take 1 tablet by mouth every 8 (eight) hours as needed for severe pain. 11/26/16  Yes Khatri, Hina, PA-C  pravastatin (PRAVACHOL) 20 MG tablet Take 1 tablet (20 mg total) by mouth every evening. 11/07/16 11/07/17 Yes Tysinger, Kermit Balo, PA-C   Physical Exam: Blood pressure 110/64, pulse (!) 104, temperature (!) 97.3 F (36.3 C), temperature source Oral, resp. rate 16, height 5\' 5"  (1.651 m), weight 90.9 kg (200 lb 6.4 oz), SpO2 97 %. Vitals:   12/22/16 0518 12/22/16 0851  BP: 130/70 110/64  Pulse:  (!) 104  Resp:  16  Temp:  (!) 97.3 F (36.3 C)  SpO2:  97%     General:  Ambulating in hallway with steady gait. Mild shortness of breath noted no acute distress  Eyes: Pupils round reactive to light EOMI no scleral icterus  ENT: Ears clear nose without drainage oropharynx without erythema or exudate  Neck: Supple no JVD full range of motion no lymphadenopathy  Cardiovascular: Regular rate and rhythm I hear no murmur gallop or rub no lower extremity edema  pedal pulses present and palpable  Respiratory: Mild increased work of breathing with ambulation. Fine crackles auscultated on the left base airflow quite diminished on the right no wheeze  Abdomen: Slightly distended slightly firm positive bowel sounds mild tenderness particularly in the upper quadrant palpation drain stool right upper quadrant intact minimal drainage. Sites without erythema or swelling  Skin: No rashes or lesions warm and dry  Musculoskeletal: Joints without swelling/erythema full range of motion  Psychiatric: Calm cooperative appropriate  Neurologic: Alert and oriented 3 ambulates with a steady gait speech clear facial symmetry  Labs on Admission:  Basic Metabolic Panel: Recent Labs  Lab 12/18/16 0433 12/20/16 0325 12/21/16 0500 12/21/16 1841 12/22/16 0411  NA 139 137 134* 131* 133*  K 3.6 3.6 4.0 3.8 3.8  CL 109 108 103 100* 100*  CO2 25 24 25 23 26   GLUCOSE 87 103* 93 138* 118*  BUN <5* <5* 5* 6 6  CREATININE 0.90 0.98 0.84 0.93 0.90  CALCIUM 8.0* 7.9* 7.9* 8.1* 7.9*   Liver Function Tests: Recent Labs  Lab 12/17/16 0529 12/18/16 0433 12/20/16 0325 12/21/16 0500 12/21/16 1841  AST 22 16 19 20 19   ALT 30 22 16* 16* 16*  ALKPHOS 147* 116 102 109 120  BILITOT 1.1 0.8 0.8 0.8 0.7  PROT 6.0* 5.6* 5.5* 5.8* 6.4*  ALBUMIN 2.3* 2.2* 2.2* 2.2* 2.3*  Recent Labs  Lab 12/20/16 0325 12/21/16 1841  LIPASE 30 17   No results for input(s): AMMONIA in the last 168 hours. CBC: Recent Labs  Lab 12/17/16 0529 12/18/16 0433 12/20/16 0325 12/21/16 0500 12/22/16 0411  WBC 11.5* 8.9 11.0* 13.4* 13.8*  NEUTROABS  --  5.1 7.2  --   --   HGB 9.8* 9.2* 9.2* 10.6* 10.0*  HCT 32.7* 30.2* 29.9* 34.2* 32.9*  MCV 93.4 92.6 92.0 91.4 91.4  PLT 735* 658* 657* 585* 519*   Cardiac Enzymes: No results for input(s): CKTOTAL, CKMB, CKMBINDEX, TROPONINI in the last 168 hours. BNP: Invalid input(s): POCBNP CBG: No results for input(s): GLUCAP in the last 168  hours.  Radiological Exams on Admission: Ct Abdomen Pelvis W Contrast  Result Date: 12/21/2016 CLINICAL DATA:  Patient had a drain placed on 12/09/11, he woke up with back pain in his hips, up into his shoulders and neck. abnormal xray suspicious for subdiaphragmatic air^ EXAM: CT ABDOMEN AND PELVIS WITH CONTRAST TECHNIQUE: Multidetector CT imaging of the abdomen and pelvis was performed using the standard protocol following bolus administration of intravenous contrast. CONTRAST:  ISOVUE-300 IOPAMIDOL (ISOVUE-300) INJECTION 61% COMPARISON:  12/07/2016 FINDINGS: Lower chest: Interval increase in moderate right pleural effusion and Consolidation/ atelectasis at the right lung base.No pneumothorax. Hepatobiliary: Interval placement of right subdiaphragmatic/ perihepatic drain with decompression of the dominant component of the perihepatic collection. A few gas bubbles in the residual loculated fluid collection presumably from flushing regimen. No liver parenchymal lesion. Cholecystectomy clips. Plastic biliary stent has been placed, with scattered gas in the central intrahepatic biliary tree implying patency. No biliary ductal dilatation. Pancreas: Unremarkable. No pancreatic ductal dilatation or surrounding inflammatory changes. Spleen: Normal in size without focal abnormality. Adrenals/Urinary Tract: Normal adrenals. Kidneys unremarkable. No hydronephrosis or focal renal lesion. Urinary bladder incompletely distended. Stomach/Bowel: Stomach is incompletely distended. Small bowel decompressed. Appendix surgically absent. Colon is nondilated, unremarkable. Vascular/Lymphatic: No abdominal or pelvic adenopathy. No significant vascular pathology identified. Reproductive: Prostate is unremarkable. Other: Trace pelvic ascites. No free air. Right anterior peritoneal surgical drain catheter remains in place with an adjacent complex loculated anterior peritoneal collection measuring 15.3 x 5.4 cm maximum transverse  dimensions (previously 17.4 x 6.4). No new abdominal fluid collections. Musculoskeletal: No acute or significant osseous findings. IMPRESSION: 1. No free air. 2. Interval pigtail drain placement into the perihepatic loculated complex collection with partial decompression, without apparent complication. 3. Stable surgical drain with slight decrease in size of adjacent complex fluid collection or hematoma. 4. Increase in moderate right pleural effusion and adjacent right lower lung consolidation/atelectasis. 5. Interval plastic biliary stent placement without apparent complication. Electronically Signed   By: Corlis Leak M.D.   On: 12/21/2016 08:38   Dg Chest Port 1 View  Result Date: 12/21/2016 CLINICAL DATA:  Shortness of breath, chest pain EXAM: PORTABLE CHEST 1 VIEW COMPARISON:  12/20/2016 FINDINGS: Increasing atelectasis or infiltrate at the left lung base. Small moderate right pleural effusion with dense consolidation at the right base. Cardiomegaly with vascular congestion. No pneumothorax. Drainage catheter in the right upper quadrant with surgical clips. Vague lucency at the right base IMPRESSION: 1. Vague lucency at the right base, uncertain if this is related to pleural effusion along the fissure creating false lucency at the right base versus small amount of pneumoperitoneum. CT chest would help to clarify this finding 2. Worsening atelectasis or infiltrate at the left base 3. Small moderate right pleural effusion 4. Cardiomegaly with vascular congestion Electronically Signed  By: Jasmine Pang M.D.   On: 12/21/2016 18:33   Dg Abd Acute W/chest  Result Date: 12/20/2016 CLINICAL DATA:  Abdominal pain and nausea. Previous cholecystectomy and appendectomy. EXAM: DG ABDOMEN ACUTE W/ 1V CHEST COMPARISON:  Chest x-ray of December 09, 2014 FINDINGS: There is new volume loss at the right lung base. The right upper lobe is clear. The left lung is clear. There is no pulmonary vascular congestion. The  cardiac silhouette is mildly enlarged but likely stable. Within the abdomen there is a pigtail drainage catheter in the right upper quadrant. There is a common bile duct stent in place. There is an additional drainage catheter which terminates superior to the superior margin of the common bile duct stent. There is a trace of gas in the right upper quadrant which may lie within the subdiaphragmatic region or subpulmonic region. The bowel gas pattern is normal. There surgical clips in the gallbladder fossa. There is gentle spinal curvature convex toward the left centered at L1. IMPRESSION: Drainage tubes in place in the right upper quadrant. Possible small amount of subdiaphragmatic gas versus subpulmonic gas. New increased density at the right lung base consistent with atelectasis or pneumonia. Electronically Signed   By: David  [place] M.D.   On: 12/20/2016 14:07      Time spent: 75 minutes  [facility]  If 7PM-7AM, please contact night-coverage [email or web address] Password [email or web address] 12/22/2016, 8:59 AM

## 2016-12-22 NOTE — Progress Notes (Signed)
3 Days Post-Op    CC:  Abdominal pain  Subjective: He just did not feel good last PM.  Didn't eat much secondary to feeling bad and fever.  On exam he really does not look sick.  He has some rales left,  and some decrease in BS base on right, not much different than yesterday.  No nausea or vomiting.    Objective: Vital signs in last 24 hours: Temp:  [98.7 F (37.1 C)-102.5 F (39.2 C)] 99.8 F (37.7 C) (11/15 0457) Pulse Rate:  [110-131] 110 (11/15 0457) Resp:  [16-18] 18 (11/15 0457) BP: (87-130)/(54-75) 130/70 (11/15 0518) SpO2:  [94 %-98 %] 96 % (11/15 0457) Weight:  [90.9 kg (200 lb 6.4 oz)] 90.9 kg (200 lb 6.4 oz) (11/15 0457) Last BM Date: 12/21/16 480 PO 500 IV 2875 urine 25 from drains  TM 102.5 last PM, 1 low BP at 87/54 recorded; O2 sats are all  Na 131, lipase 17, LFT's remain normal  WBC trending up still, 13.8 today CXR last PM:  Vague lucency at the right base, uncertain if this is related to pleural effusion along the fissure creating false lucency at the right base versus small amount of pneumoperitoneum. CT chest would help to clarify this finding Worsening atelectasis or infiltrate at the left base Small moderate right pleural effusion Cardiomegaly with vascular congestion  Intake/Output from previous day: 11/14 0701 - 11/15 0700 In: 990 [P.O.:480; I.V.:10; IV Piggyback:500] Out: 2900 [Urine:2875; Drains:25] Intake/Output this shift: No intake/output data recorded.  General appearance: alert, cooperative and no distress Resp: somewhat decreased in the base on the right and few rales left base.   GI: still a little distended, sites all look good.  No nausea or vomiting, drain output is markedly improved.   No edema lower extremities   Lab Results:  Recent Labs    12/21/16 0500 12/22/16 0411  WBC 13.4* 13.8*  HGB 10.6* 10.0*  HCT 34.2* 32.9*  PLT 585* 519*    BMET Recent Labs    12/21/16 1841 12/22/16 0411  NA 131* 133*  K 3.8 3.8   CL 100* 100*  CO2 23 26  GLUCOSE 138* 118*  BUN 6 6  CREATININE 0.93 0.90  CALCIUM 8.1* 7.9*   PT/INR No results for input(s): LABPROT, INR in the last 72 hours.  Recent Labs  Lab 12/17/16 0529 12/18/16 0433 12/20/16 0325 12/21/16 0500 12/21/16 1841  AST 22 16 19 20 19   ALT 30 22 16* 16* 16*  ALKPHOS 147* 116 102 109 120  BILITOT 1.1 0.8 0.8 0.8 0.7  PROT 6.0* 5.6* 5.5* 5.8* 6.4*  ALBUMIN 2.3* 2.2* 2.2* 2.2* 2.3*     Lipase     Component Value Date/Time   LIPASE 17 12/21/2016 1841     Medications: . acetaminophen  1,000 mg Oral Q8H  . docusate sodium  100 mg Oral BID  . heparin subcutaneous  5,000 Units Subcutaneous Q8H  . metoprolol succinate  25 mg Oral Q breakfast  . multivitamins with iron  1 tablet Oral QPC supper  . pantoprazole  40 mg Oral Daily   . piperacillin-tazobactam (ZOSYN)  IV 3.375 g (12/22/16 0349)  . vancomycin Stopped (12/22/16 0513)   Anti-infectives (From admission, onward)   Start     Dose/Rate Route Frequency Ordered Stop   12/21/16 1930  piperacillin-tazobactam (ZOSYN) IVPB 3.375 g     3.375 g 12.5 mL/hr over 240 Minutes Intravenous Every 8 hours 12/21/16 1900     12/21/16  1930  vancomycin (VANCOCIN) IVPB 1000 mg/200 mL premix     1,000 mg 200 mL/hr over 60 Minutes Intravenous Every 8 hours 12/21/16 1900     12/21/16 1015  amoxicillin-clavulanate (AUGMENTIN) 875-125 MG per tablet 1 tablet  Status:  Discontinued     1 tablet Oral Every 12 hours 12/21/16 1012 12/21/16 1854   12/07/16 0830  piperacillin-tazobactam (ZOSYN) IVPB 3.375 g     3.375 g 12.5 mL/hr over 240 Minutes Intravenous Every 8 hours 12/07/16 0736 12/20/16 2359   12/02/16 1500  cefTRIAXone (ROCEPHIN) 2 g in dextrose 5 % 50 mL IVPB  Status:  Discontinued     2 g 100 mL/hr over 30 Minutes Intravenous Every 24 hours 12/02/16 1337 12/07/16 0736      Assessment/Plan Gangrenous cholecystitis with cholelithiasis  Laparoscopic cholecystectomy, 19 French Blake drain  placement, gangrenous changes involving the entirety of the back wall, short cystic duct precluded cholangiogram10/27/18, Dr. Violeta GelinasBurke Thompson CT 10/31:  19.5 cm fluid collection - Biloma/abscess  17.4 cm possible hematoma IR drain Placed 12/08/16, 1050 In drain on return from the floor and 2035 ml total yesterday.   Drain 360/24h ERCP attempt 11/6 Dr. Dulce Sellarutlaw - unable to do (food in stomach) ERCP attempt 11/8 Dr. Dulce Sellarutlaw - unable to do (throat edema) ERCP 10Fr 7 cm drain placed, sphincterotomy   12/19/16, Dr. Ewing SchleinMagod Fever and increasing leukocytosis - TM 102.5 PM 12/21/16 GI called back to see Pt.   Acute kidney injury:  resolved   Anemia   RLL pleural effusion/atelectasis -  IS  CXR PM 11/14:  Worsening atelectasis or infiltrate at the left base Small moderate right pleural effusion Cardiomegaly with vascular congestion Vancomycin and Zosyn restarted last PM   Active problems: Hypertension - Toprol - 25mg  QD Hx of Borderline CHF - Grade 2 diastolic dysfunction/EF 44% Hyperlipidemia History of thyroid disease FEN: soft diet ID: Rocephin 10/26 - 10/31.  Zosyn 10/31- 11/13; recurrent fever =>> 11/14 Vancomycin/Zosyn  DVT:  SCD's, SQ hep Follow up: Dr Janee Mornhompson    Plan:  Recurrent fevers last PM, WBC still trending up.  CXR shows some new infiltrate LLL, Exam is really unremarkable and not unlike yesterday.  I have discussed with DR. Brahmbhatt, and ask Medicine to see and see if we can figure out where fevers and slow rising WBC are from.  I have ordered blood cultures, urine culture, CXR last PM he is not coughing up anything so I have not ordered sputums.  Continue Vancomycin and Zosyn for now.          LOS: 19 days    Dolph Tavano 12/22/2016 [id number]

## 2016-12-22 NOTE — Progress Notes (Signed)
Patient ID: Bobby Kelly, male   DOB: 1980-11-15, 36 y.o.   MRN: [id number]     Referring Physician(s): Dr. Violeta GelinasBurke Thompson  Supervising Physician: Richarda OverlieHenn, Adam  [facility]  Chief Complaint: biloma  Subjective: Patient with new fevers overnight, thought to be PNA.  Biloma drain not putting anything out since yesterday  Allergies: Patient has no known allergies.  Medications: Prior to Admission medications   Medication Sig Start Date End Date Taking? Authorizing Provider  lisinopril (PRINIVIL,ZESTRIL) 20 MG tablet Take 1 tablet (20 mg total) by mouth daily. 10/17/16 01/15/17 Yes Camnitz, Will Daphine DeutscherMartin, MD  metoprolol succinate (TOPROL-XL) 50 MG 24 hr tablet Take 1 tablet (50 mg total) by mouth daily. Take with or immediately following a meal. 10/17/16  Yes Camnitz, Will Daphine DeutscherMartin, MD  ondansetron (ZOFRAN ODT) 4 MG disintegrating tablet Take 1 tablet (4 mg total) by mouth every 8 (eight) hours as needed for nausea or vomiting. 11/26/16  Yes Khatri, Hina, PA-C  oxyCODONE-acetaminophen (PERCOCET/ROXICET) 5-325 MG tablet Take 1 tablet by mouth every 8 (eight) hours as needed for severe pain. 11/26/16  Yes Khatri, Hina, PA-C  pravastatin (PRAVACHOL) 20 MG tablet Take 1 tablet (20 mg total) by mouth every evening. 11/07/16 11/07/17 Yes Tysinger, Kermit Baloavid S, PA-C    Vital Signs: BP 110/64 (BP Location: Right Arm)   Pulse (!) 104   Temp (!) 97.3 F (36.3 C) (Oral)   Resp 16   Ht 5\' 5"  (1.651 m)   Wt 200 lb 6.4 oz (90.9 kg)   SpO2 97%   BMI 33.35 kg/m   Physical Exam: Abd: soft, drain with no output, drain flushed and it flushes well but won't pull back my flush.  Drain site is c/d/i  Imaging: Ct Chest Wo Contrast  Result Date: 12/22/2016 CLINICAL DATA:  Inpatient. Status post cholecystectomy 12/03/2016 complicated by gallbladder fossa and right perihepatic complex collection requiring percutaneous drain placement 12/08/2016. Persistent right chest and back pain. Follow-up  right pleural effusion. EXAM: CT CHEST WITHOUT CONTRAST TECHNIQUE: Multidetector CT imaging of the chest was performed following the standard protocol without IV contrast. COMPARISON:  Chest radiograph from one day prior. 12/20/2016 CT abdomen/pelvis. FINDINGS: Cardiovascular: Normal heart size. No significant pericardial fluid/thickening. Great vessels are normal in course and caliber. Mediastinum/Nodes: No discrete thyroid nodules. Unremarkable esophagus. No pathologically enlarged axillary, mediastinal or gross hilar lymph nodes, noting limited sensitivity for the detection of hilar adenopathy on this noncontrast study. Lungs/Pleura: No pneumothorax. No left pleural effusion. Moderate to large dependent right pleural effusion, increased since 12/20/2016 CT abdomen study. Complete right lower lobe compressive atelectasis. Segmental medial right middle lobe compressive atelectasis. Segmental medial right upper lobe atelectasis. Mild-to-moderate platelike left lower lobe atelectasis. Subsegmental lingular atelectasis. Otherwise no acute consolidative airspace disease, lung masses or significant pulmonary nodules in the limited aerated portions of the lungs. Upper abdomen: Mild-to-moderate elevation of the right hemidiaphragm. Percutaneous pigtail drainage catheter terminates anteriorly in the perihepatic space, unchanged. Crescentic right perihepatic collection measuring up to 4.0 cm thickness (series 7/ image 50), increased from 2.5 cm on 12/20/2016 CT abdomen study. Slightly increased mass-effect on the peripheral right liver lobe capsule by the perihepatic collection. Expected gas within the nondependent portion of the right perihepatic collection. Partial visualization of the tip of a JP drain in the intersegmental fissure anteriorly. Musculoskeletal: No aggressive appearing focal osseous lesions. Mild thoracic spondylosis. IMPRESSION: 1. Moderate to large dependent right pleural effusion, increased since  12/20/2016 CT abdomen study . 2. Complete right  lower lobe and mild-to-moderate right middle lobe, right upper lobe and left lower lobe atelectasis. 3. Partially visualized right perihepatic collection appears mildly increased in size since 12/20/2016 CT abdomen study. Stable position of right upper quadrant percutaneous pigtail drain within the ventral portion of this collection. Electronically Signed   By: Delbert Phenix M.D.   On: 12/22/2016 13:50   Ct Abdomen Pelvis W Contrast  Result Date: 12/21/2016 CLINICAL DATA:  Patient had a drain placed on 12/09/11, he woke up with back pain in his hips, up into his shoulders and neck. abnormal xray suspicious for subdiaphragmatic air^ EXAM: CT ABDOMEN AND PELVIS WITH CONTRAST TECHNIQUE: Multidetector CT imaging of the abdomen and pelvis was performed using the standard protocol following bolus administration of intravenous contrast. CONTRAST:  ISOVUE-300 IOPAMIDOL (ISOVUE-300) INJECTION 61% COMPARISON:  12/07/2016 FINDINGS: Lower chest: Interval increase in moderate right pleural effusion and Consolidation/ atelectasis at the right lung base.No pneumothorax. Hepatobiliary: Interval placement of right subdiaphragmatic/ perihepatic drain with decompression of the dominant component of the perihepatic collection. A few gas bubbles in the residual loculated fluid collection presumably from flushing regimen. No liver parenchymal lesion. Cholecystectomy clips. Plastic biliary stent has been placed, with scattered gas in the central intrahepatic biliary tree implying patency. No biliary ductal dilatation. Pancreas: Unremarkable. No pancreatic ductal dilatation or surrounding inflammatory changes. Spleen: Normal in size without focal abnormality. Adrenals/Urinary Tract: Normal adrenals. Kidneys unremarkable. No hydronephrosis or focal renal lesion. Urinary bladder incompletely distended. Stomach/Bowel: Stomach is incompletely distended. Small bowel decompressed. Appendix  surgically absent. Colon is nondilated, unremarkable. Vascular/Lymphatic: No abdominal or pelvic adenopathy. No significant vascular pathology identified. Reproductive: Prostate is unremarkable. Other: Trace pelvic ascites. No free air. Right anterior peritoneal surgical drain catheter remains in place with an adjacent complex loculated anterior peritoneal collection measuring 15.3 x 5.4 cm maximum transverse dimensions (previously 17.4 x 6.4). No new abdominal fluid collections. Musculoskeletal: No acute or significant osseous findings. IMPRESSION: 1. No free air. 2. Interval pigtail drain placement into the perihepatic loculated complex collection with partial decompression, without apparent complication. 3. Stable surgical drain with slight decrease in size of adjacent complex fluid collection or hematoma. 4. Increase in moderate right pleural effusion and adjacent right lower lung consolidation/atelectasis. 5. Interval plastic biliary stent placement without apparent complication. Electronically Signed   By: Corlis Leak M.D.   On: 12/21/2016 08:38   Dg Chest Port 1 View  Result Date: 12/21/2016 CLINICAL DATA:  Shortness of breath, chest pain EXAM: PORTABLE CHEST 1 VIEW COMPARISON:  12/20/2016 FINDINGS: Increasing atelectasis or infiltrate at the left lung base. Small moderate right pleural effusion with dense consolidation at the right base. Cardiomegaly with vascular congestion. No pneumothorax. Drainage catheter in the right upper quadrant with surgical clips. Vague lucency at the right base IMPRESSION: 1. Vague lucency at the right base, uncertain if this is related to pleural effusion along the fissure creating false lucency at the right base versus small amount of pneumoperitoneum. CT chest would help to clarify this finding 2. Worsening atelectasis or infiltrate at the left base 3. Small moderate right pleural effusion 4. Cardiomegaly with vascular congestion Electronically Signed   By: Jasmine Pang  M.D.   On: 12/21/2016 18:33   Dg Ercp Biliary & Pancreatic Ducts  Result Date: 12/19/2016 CLINICAL DATA:  Anastomotic leak of biliary tree. History of cholecystectomy. EXAM: ERCP TECHNIQUE: Multiple spot images obtained with the fluoroscopic device and submitted for interpretation post-procedure. FLUOROSCOPY TIME:  Fluoroscopy Time:  146 seconds Number of  Acquired Spot Images: 7 COMPARISON:  CT 11/09/2016 FINDINGS: Cannulation and opacification of the extrahepatic biliary system. There is a pigtail drain and a surgical drain present. The biliary system is non dilated. There appears to be contrast within the cystic duct and filling of an irregular space near the pigtail drain. Findings are compatible with a biliary leak which could be originating from or near the cystic duct. A nonmetallic biliary stent was placed in the distal common bile duct. IMPRESSION: Evidence for a bile leak. Placement of a nonmetallic biliary stent. These images were submitted for radiologic interpretation only. Please see the procedural report for the amount of contrast and the fluoroscopy time utilized. Electronically Signed   By: Richarda Overlie M.D.   On: 12/19/2016 09:18   Dg Abd Acute W/chest  Result Date: 12/20/2016 CLINICAL DATA:  Abdominal pain and nausea. Previous cholecystectomy and appendectomy. EXAM: DG ABDOMEN ACUTE W/ 1V CHEST COMPARISON:  Chest x-ray of December 09, 2014 FINDINGS: There is new volume loss at the right lung base. The right upper lobe is clear. The left lung is clear. There is no pulmonary vascular congestion. The cardiac silhouette is mildly enlarged but likely stable. Within the abdomen there is a pigtail drainage catheter in the right upper quadrant. There is a common bile duct stent in place. There is an additional drainage catheter which terminates superior to the superior margin of the common bile duct stent. There is a trace of gas in the right upper quadrant which may lie within the subdiaphragmatic  region or subpulmonic region. The bowel gas pattern is normal. There surgical clips in the gallbladder fossa. There is gentle spinal curvature convex toward the left centered at L1. IMPRESSION: Drainage tubes in place in the right upper quadrant. Possible small amount of subdiaphragmatic gas versus subpulmonic gas. New increased density at the right lung base consistent with atelectasis or pneumonia. Electronically Signed   By: David  [place] M.D.   On: 12/20/2016 14:07    Labs:  CBC: Recent Labs    12/18/16 0433 12/20/16 0325 12/21/16 0500 12/22/16 0411  WBC 8.9 11.0* 13.4* 13.8*  HGB 9.2* 9.2* 10.6* 10.0*  HCT 30.2* 29.9* 34.2* 32.9*  PLT 658* 657* 585* 519*    COAGS: Recent Labs    12/08/16 0747  INR 1.08    BMP: Recent Labs    12/20/16 0325 12/21/16 0500 12/21/16 1841 12/22/16 0411  NA 137 134* 131* 133*  K 3.6 4.0 3.8 3.8  CL 108 103 100* 100*  CO2 24 25 23 26   GLUCOSE 103* 93 138* 118*  BUN <5* 5* 6 6  CALCIUM 7.9* 7.9* 8.1* 7.9*  CREATININE 0.98 0.84 0.93 0.90  GFRNONAA >60 >60 >60 >60  GFRAA >60 >60 >60 >60    LIVER FUNCTION TESTS: Recent Labs    12/18/16 0433 12/20/16 0325 12/21/16 0500 12/21/16 1841  BILITOT 0.8 0.8 0.8 0.7  AST 16 19 20 19   ALT 22 16* 16* 16*  ALKPHOS 116 102 109 120  PROT 5.6* 5.5* 5.8* 6.4*  ALBUMIN 2.2* 2.2* 2.2* 2.3*    Assessment and Plan: 1. Bile leak, s/p perc drain of biloma  Drain today with no output even after flushing.  Discussed with MD.  We will plan to resposition/exchange his biloma drain tomorrow in IR.  We will plan to do thora at the same time.  Electronically Signed: Letha Cape 12/22/2016, 3:03 PM   I spent a total of 15 Minutes at the the patient's bedside AND  on the patient's hospital floor or unit, greater than 50% of which was counseling/coordinating care for biloma

## 2016-12-22 NOTE — Progress Notes (Signed)
Karla Jorge 1:59 PM  Subjective: Patient doing better primarily hurting around drain whose output has been minimal and he has no new complaints but he did spike a fever yesterday and we are asked to reevaluate and he is tolerating his diet and ambulating without problems  Objective: Vital signs stable afebrile no acute distress currently doing well abdomen is soft nontender white count unchanged other labs okay CT of chest from today reviewed abdominal CT from 2 days ago reviewed  Assessment: Bile leak status post stent seemingly working fine  Plan: His fever may be due to pneumonia or pleural fluid and you might consider a thoracentesis to be sure otherwise may want to try to aspirate his abdominal fluid to rule out infection or even ask IR to flush the drains were repositioned to hopefully get them to drain better and please call us if we could be of any further assistance with this hospital stay otherwise follow-up with me in the office in 3-4 weeks to set up endoscopy with stent removal in roughly 6 weeks  [facility]  Pager [phone] After 5PM or if no answer call [phone]

## 2016-12-22 NOTE — Progress Notes (Signed)
Pt has temp of 101.8 - per orders to notify MD - I have paged MD on call for [facility].

## 2016-12-23 ENCOUNTER — Inpatient Hospital Stay (HOSPITAL_COMMUNITY): Payer: No Typology Code available for payment source

## 2016-12-23 ENCOUNTER — Encounter (HOSPITAL_COMMUNITY): Payer: Self-pay | Admitting: Interventional Radiology

## 2016-12-23 DIAGNOSIS — J189 Pneumonia, unspecified organism: Secondary | ICD-10-CM

## 2016-12-23 HISTORY — PX: IR THORACENTESIS ASP PLEURAL SPACE W/IMG GUIDE: IMG5380

## 2016-12-23 HISTORY — PX: IR CATHETER TUBE CHANGE: IMG717

## 2016-12-23 LAB — BASIC METABOLIC PANEL
ANION GAP: 7 (ref 5–15)
BUN: 9 mg/dL (ref 6–20)
CALCIUM: 7.4 mg/dL — AB (ref 8.9–10.3)
CO2: 24 mmol/L (ref 22–32)
CREATININE: 0.84 mg/dL (ref 0.61–1.24)
Chloride: 99 mmol/L — ABNORMAL LOW (ref 101–111)
Glucose, Bld: 112 mg/dL — ABNORMAL HIGH (ref 65–99)
Potassium: 3.8 mmol/L (ref 3.5–5.1)
SODIUM: 130 mmol/L — AB (ref 135–145)

## 2016-12-23 LAB — CBC
HCT: 30.6 % — ABNORMAL LOW (ref 39.0–52.0)
Hemoglobin: 9.3 g/dL — ABNORMAL LOW (ref 13.0–17.0)
MCH: 27.5 pg (ref 26.0–34.0)
MCHC: 30.4 g/dL (ref 30.0–36.0)
MCV: 90.5 fL (ref 78.0–100.0)
PLATELETS: 451 10*3/uL — AB (ref 150–400)
RBC: 3.38 MIL/uL — ABNORMAL LOW (ref 4.22–5.81)
RDW: 14.5 % (ref 11.5–15.5)
WBC: 13.5 10*3/uL — AB (ref 4.0–10.5)

## 2016-12-23 LAB — PROTIME-INR
INR: 1.19
PROTHROMBIN TIME: 15 s (ref 11.4–15.2)

## 2016-12-23 LAB — LACTATE DEHYDROGENASE, PLEURAL OR PERITONEAL FLUID: LD, Fluid: 257 U/L — ABNORMAL HIGH (ref 3–23)

## 2016-12-23 LAB — GRAM STAIN

## 2016-12-23 LAB — URINE CULTURE: CULTURE: NO GROWTH

## 2016-12-23 LAB — BODY FLUID CELL COUNT WITH DIFFERENTIAL
Eos, Fluid: 2 %
LYMPHS FL: 26 %
MONOCYTE-MACROPHAGE-SEROUS FLUID: 12 % — AB (ref 50–90)
Neutrophil Count, Fluid: 60 % — ABNORMAL HIGH (ref 0–25)
WBC FLUID: 3850 uL — AB (ref 0–1000)

## 2016-12-23 LAB — GLUCOSE, PLEURAL OR PERITONEAL FLUID: Glucose, Fluid: 84 mg/dL

## 2016-12-23 LAB — HIV ANTIBODY (ROUTINE TESTING W REFLEX): HIV Screen 4th Generation wRfx: NONREACTIVE

## 2016-12-23 LAB — PROCALCITONIN: Procalcitonin: 0.35 ng/mL

## 2016-12-23 MED ORDER — LIDOCAINE HCL 1 % IJ SOLN
INTRAMUSCULAR | Status: AC
  Filled 2016-12-23: qty 20

## 2016-12-23 MED ORDER — IOPAMIDOL (ISOVUE-300) INJECTION 61%
INTRAVENOUS | Status: AC
  Administered 2016-12-23: 10 mL
  Filled 2016-12-23: qty 50

## 2016-12-23 MED ORDER — SODIUM CHLORIDE 0.9 % IV SOLN
INTRAVENOUS | Status: DC
  Administered 2016-12-24 – 2016-12-25 (×2): via INTRAVENOUS

## 2016-12-23 MED ORDER — ONDANSETRON HCL 4 MG/2ML IJ SOLN: 4.0000 mg | Freq: Once | INTRAMUSCULAR | Status: DC | PRN

## 2016-12-23 MED ORDER — FENTANYL CITRATE (PF) 100 MCG/2ML IJ SOLN: 25.0000 ug | INTRAMUSCULAR | Status: DC | PRN

## 2016-12-23 MED ORDER — LIDOCAINE HCL 1 % IJ SOLN
INTRAMUSCULAR | Status: DC | PRN
  Administered 2016-12-23: 10 mL

## 2016-12-23 MED ORDER — SODIUM CHLORIDE 0.9% FLUSH
5.0000 mL | Freq: Three times a day (TID) | INTRAVENOUS | Status: DC
  Administered 2016-12-24 – 2016-12-25 (×2): 5 mL via INTRAVENOUS

## 2016-12-23 NOTE — Progress Notes (Signed)
4 Days Post-Op    CC: Abdominal pain  Subjective: No real change since yesterday.  Continues to have low-grade fevers.  Complains of some abdominal pain and pain going into his back.  He does not really complain of shortness of breath.  Breath sounds remain down on the right side.  Left base stable, minimal change.  Objective: Vital signs in last 24 hours: Temp:  [97.3 F (36.3 C)-101.8 F (38.8 C)] 100 F (37.8 C) (11/16 0505) Pulse Rate:  [104-121] 115 (11/16 0505) Resp:  [16] 16 (11/16 0505) BP: (103-126)/(62-72) 115/62 (11/16 0505) SpO2:  [94 %-97 %] 95 % (11/16 0505) Weight:  [91.6 kg (201 lb 15.1 oz)] 91.6 kg (201 lb 15.1 oz) (11/16 0500) Last BM Date: 12/22/16 240 PO 750 IV Urine 2500 Drains 20 mL total from both the IR and JP drain TM 101.8 yesterday PM low grade fever 100 remainder of the day NA 130 WBC -13.5  INR 1.19 Intake/Output from previous day: 11/15 0701 - 11/16 0700 In: 990 [P.O.:240; IV Piggyback:750] Out: 2520 [Urine:2500; Drains:20] Intake/Output this shift: No intake/output data recorded.  General appearance: alert, cooperative and no distress Resp: Breath sounds down base on the right.  Breath sounds are stable on the left this a.m.  He does not appear short of breath. GI: Soft still somewhat distended.  No real drainage from either the JP or the IR drain.  The right upper quadrant goes to his back.  Lab Results:  Recent Labs    12/22/16 0411 12/23/16 0214  WBC 13.8* 13.5*  HGB 10.0* 9.3*  HCT 32.9* 30.6*  PLT 519* 451*    BMET Recent Labs    12/22/16 0411 12/23/16 0214  NA 133* 130*  K 3.8 3.8  CL 100* 99*  CO2 26 24  GLUCOSE 118* 112*  BUN 6 9  CREATININE 0.90 0.84  CALCIUM 7.9* 7.4*   PT/INR Recent Labs    12/23/16 0214  LABPROT 15.0  INR 1.19    Recent Labs  Lab 12/17/16 0529 12/18/16 0433 12/20/16 0325 12/21/16 0500 12/21/16 1841  AST 22 16 19 20 19   ALT 30 22 16* 16* 16*  ALKPHOS 147* 116 102 109 120   BILITOT 1.1 0.8 0.8 0.8 0.7  PROT 6.0* 5.6* 5.5* 5.8* 6.4*  ALBUMIN 2.3* 2.2* 2.2* 2.2* 2.3*     Lipase     Component Value Date/Time   LIPASE 17 12/21/2016 1841     Medications: . acetaminophen  1,000 mg Oral Q8H  . docusate sodium  100 mg Oral BID  . heparin subcutaneous  5,000 Units Subcutaneous Q8H  . metoprolol succinate  25 mg Oral Q breakfast  . multivitamins with iron  1 tablet Oral QPC supper  . pantoprazole  40 mg Oral Daily   . piperacillin-tazobactam (ZOSYN)  IV Stopped (12/23/16 0749)  . vancomycin Stopped (12/23/16 0455)   Anti-infectives (From admission, onward)   Start     Dose/Rate Route Frequency Ordered Stop   12/21/16 1930  piperacillin-tazobactam (ZOSYN) IVPB 3.375 g     3.375 g 12.5 mL/hr over 240 Minutes Intravenous Every 8 hours 12/21/16 1900     12/21/16 1930  vancomycin (VANCOCIN) IVPB 1000 mg/200 mL premix     1,000 mg 200 mL/hr over 60 Minutes Intravenous Every 8 hours 12/21/16 1900     12/21/16 1015  amoxicillin-clavulanate (AUGMENTIN) 875-125 MG per tablet 1 tablet  Status:  Discontinued     1 tablet Oral Every 12 hours 12/21/16 1012  12/21/16 1854   12/07/16 0830  piperacillin-tazobactam (ZOSYN) IVPB 3.375 g     3.375 g 12.5 mL/hr over 240 Minutes Intravenous Every 8 hours 12/07/16 0736 12/20/16 2359   12/02/16 1500  cefTRIAXone (ROCEPHIN) 2 g in dextrose 5 % 50 mL IVPB  Status:  Discontinued     2 g 100 mL/hr over 30 Minutes Intravenous Every 24 hours 12/02/16 1337 12/07/16 0736      Assessment/Plan  Gangrenous cholecystitis with cholelithiasis  Laparoscopic cholecystectomy, 19 French Blake drain placement, gangrenous changes involving the entirety of the back wall, short cystic duct precluded cholangiogram10/27/18, Dr. Violeta Kelly CT 10/31:  19.5 cm fluid collection - Biloma/abscess  17.4 cm possible hematoma IR drain Placed 12/08/16, 1050 In drain on return from the floor and 2035 ml total yesterday.   Drain 360/24h ERCP attempt  11/6 Dr. Dulce Kelly - unable to do (food in stomach) ERCP attempt 11/8 Dr. Dulce Kelly - unable to do (throat edema) ERCP 10Fr 7 cm drain placed, sphincterotomy   12/19/16, Dr. Ewing Kelly Fever and increasing leukocytosis - TM 102.5 PM 12/21/16 Plan drain repositioning by IR 12/23/16    RLL pleural effusion/atelectasis -  IS  CXR PM 11/14:  Worsening atelectasis or infiltrate at the left base Small moderate right pleural effusion Cardiomegaly with vascular congestion Vancomycin and Zosyn restarted PM 11/14 Healthcare associated pneumonia -cultures pending -strep pneumoniae urinary antigen negative CT of the chest 12/22/16: Moderate to large dependent right pleural effusion; complete right lower lobe middle lobe and left lower lobe atelectasis.  Increased right perihepatic collection, compared to 12/20/2016.   Acute kidney injury:  resolved   Anemia -stable  Hyponatremia -normal saline IV fluid restarted 12/23/16  Active problems: Hypertension - Toprol - 25mg  QD Hx of Borderline CHF - Grade 2 diastolic dysfunction/EF 44% Hyperlipidemia History of thyroid disease FEN: soft diet ID: Rocephin 10/26 - 10/31.  Zosyn 10/31- 11/13; recurrent fever =>> 11/14 Vancomycin/Zosyn  DVT:  SCD's, SQ hep Follow up: Dr Bobby Kelly    Plan: Continue antibiotics, repositioning of IR drain today.  Right thoracentesis today.  His sodium is decreasing I am is starting him on some normal saline.  Recheck labs in a.m.  LOS: 20 days    Bobby Kelly 12/23/2016 [phone]

## 2016-12-23 NOTE — Procedures (Signed)
Pre procedural Dx: Symptomatic Pleural effusion Post procedural Dx: Same  Successful US guided right sided thoracentesis yielding 850 cc of serous pleural fluid.   Samples sent to lab for analysis.  EBL: None  Complications: None immediate.  Katherina Right, MD Pager #: [phone]

## 2016-12-23 NOTE — Procedures (Signed)
Pre procedural Dx: Bile leak Post procedural Dx: Same  Technically successful fluoro guided exchange and re positioning of 12 Fr drainage catheter, now at the level of the gallbladder fossa.  Respostioning yielded the return of bilious appearing fluid.  EBL: None  Complications: None immediate  Katherina Right, MD Pager #: [phone]

## 2016-12-23 NOTE — Progress Notes (Signed)
PROGRESS NOTE    Bobby Kelly  ZOX:[id number] DOB: [birth year] DOA: 12/02/2016 PCP: Jac Canavan, PA-C  Outpatient Specialists:     Brief Narrative:  Patient is a 36 year old Male, admitted 2 weeks ago with gangrenous cholecystitis status post cholecystectomy who developed a bile leak and underwent ERCP with drains and stents. In addition interventional radiology place perihepatic drain for abscess prior to that. Hospitalist was consulted as patient developed intermittent fevers hypertension leukocytosis and tachycardia. Patient underwent left sided thoracentesis today. Pleural fluid analysis is pending. Patient feels better and denied any constitutional symptoms. Patient is on Vanc and Zosyn.   Assessment & Plan:   Principal Problem:   HCAP (healthcare-associated pneumonia) Active Problems:   Acute calculous cholecystitis   Pleural effusion   CHF (congestive heart failure) (HCC)   Cholelithiasis  CT Scan revealed effusion and compressive atelectasis Continue work up. Rule out exudative effusion Continue antibiotics Continue to monitor vitals, and other constitutional symptos Incentive spirometry   DVT prophylaxis: [place] Heparin Code Status: Full Family Communication: Wife Disposition Plan: Will depend on hospital course   Consultants:     Procedures:   Right sided thoracentesis  Antimicrobials:   Vanc  Zosyn    Subjective: No fever or chills. Feels better today  Objective: Vitals:   12/23/16 0032 12/23/16 0500 12/23/16 0505 12/23/16 1442  BP:   115/62 116/60  Pulse:   (!) 115 100  Resp:   16 16  Temp: 100 F (37.8 C)  100 F (37.8 C) 98.5 F (36.9 C)  TempSrc: Oral  Oral Oral  SpO2:   95% 98%  Weight:  91.6 kg (201 lb 15.1 oz)    Height:        Intake/Output Summary (Last 24 hours) at 12/23/2016 1624 Last data filed at 12/23/2016 1500 Gross per 24 hour  Intake 1350 ml  Output 3030 ml  Net -1680 ml   Filed Weights   12/20/16 0500  12/22/16 0457 12/23/16 0500  Weight: 90.5 kg (199 lb 8.3 oz) 90.9 kg (200 lb 6.4 oz) 91.6 kg (201 lb 15.1 oz)    Examination:  General exam: Appears calm and comfortable HEENT - Pallor  Respiratory system: Decreased air entry right lung field. Cardiovascular system: S1 & S2. Gastrointestinal system: Abdomen has drains (right abdominal area). Central nervous system: Alert and oriented. No focal neurological deficits. Extremities: No leg edema. Psychiatry: Judgement and insight appear normal. Mood & affect appropriate.     Data Reviewed: I have personally reviewed following labs and imaging studies  CBC: Recent Labs  Lab 12/18/16 0433 12/20/16 0325 12/21/16 0500 12/22/16 0411 12/23/16 0214  WBC 8.9 11.0* 13.4* 13.8* 13.5*  NEUTROABS 5.1 7.2  --   --   --   HGB 9.2* 9.2* 10.6* 10.0* 9.3*  HCT 30.2* 29.9* 34.2* 32.9* 30.6*  MCV 92.6 92.0 91.4 91.4 90.5  PLT 658* 657* 585* 519* 451*   Basic Metabolic Panel: Recent Labs  Lab 12/20/16 0325 12/21/16 0500 12/21/16 1841 12/22/16 0411 12/23/16 0214  NA 137 134* 131* 133* 130*  K 3.6 4.0 3.8 3.8 3.8  CL 108 103 100* 100* 99*  CO2 24 25 23 26 24   GLUCOSE 103* 93 138* 118* 112*  BUN <5* 5* 6 6 9   CREATININE 0.98 0.84 0.93 0.90 0.84  CALCIUM 7.9* 7.9* 8.1* 7.9* 7.4*   GFR: Estimated Creatinine Clearance: 128.8 mL/min (by C-G formula based on SCr of 0.84 mg/dL). Liver Function Tests: Recent Labs  Lab 12/17/16 0529 12/18/16  [id number] 12/20/16 0325 12/21/16 0500 12/21/16 1841  AST 22 16 19 20 19   ALT 30 22 16* 16* 16*  ALKPHOS 147* 116 102 109 120  BILITOT 1.1 0.8 0.8 0.8 0.7  PROT 6.0* 5.6* 5.5* 5.8* 6.4*  ALBUMIN 2.3* 2.2* 2.2* 2.2* 2.3*   Recent Labs  Lab 12/20/16 0325 12/21/16 1841  LIPASE 30 17   No results for input(s): AMMONIA in the last 168 hours. Coagulation Profile: Recent Labs  Lab 12/23/16 0214  INR 1.19   Cardiac Enzymes: No results for input(s): CKTOTAL, CKMB, CKMBINDEX, TROPONINI in the last  168 hours. BNP (last 3 results) No results for input(s): PROBNP in the last 8760 hours. HbA1C: No results for input(s): HGBA1C in the last 72 hours. CBG: No results for input(s): GLUCAP in the last 168 hours. Lipid Profile: No results for input(s): CHOL, HDL, LDLCALC, TRIG, CHOLHDL, LDLDIRECT in the last 72 hours. Thyroid Function Tests: No results for input(s): TSH, T4TOTAL, FREET4, T3FREE, THYROIDAB in the last 72 hours. Anemia Panel: No results for input(s): VITAMINB12, FOLATE, FERRITIN, TIBC, IRON, RETICCTPCT in the last 72 hours. Urine analysis:    Component Value Date/Time   COLORURINE AMBER (A) 12/22/2016 1030   APPEARANCEUR HAZY (A) 12/22/2016 1030   LABSPEC 1.043 (H) 12/22/2016 1030   LABSPEC 1.020 11/03/2016 0827   PHURINE 5.0 12/22/2016 1030   GLUCOSEU NEGATIVE 12/22/2016 1030   HGBUR NEGATIVE 12/22/2016 1030   BILIRUBINUR SMALL (A) 12/22/2016 1030   BILIRUBINUR negative 11/03/2016 0827   BILIRUBINUR neg 04/28/2016 1146   KETONESUR NEGATIVE 12/22/2016 1030   PROTEINUR 100 (A) 12/22/2016 1030   UROBILINOGEN negative 04/28/2016 1146   NITRITE NEGATIVE 12/22/2016 1030   LEUKOCYTESUR NEGATIVE 12/22/2016 1030   Sepsis Labs: @LABRCNTIP (procalcitonin:4,lacticidven:4)  ) Recent Results (from the past 240 hour(s))  Culture, blood (Routine X 2) w Reflex to ID Panel     Status: None (Preliminary result)   Collection Time: 12/22/16  8:43 AM  Result Value Ref Range Status   Specimen Description BLOOD RIGHT ANTECUBITAL  Final   Special Requests   Final    BOTTLES DRAWN AEROBIC AND ANAEROBIC Blood Culture adequate volume   Culture NO GROWTH 1 DAY  Final   Report Status PENDING  Incomplete  Culture, blood (Routine X 2) w Reflex to ID Panel     Status: None (Preliminary result)   Collection Time: 12/22/16  9:30 AM  Result Value Ref Range Status   Specimen Description BLOOD CENTRAL LINE MIDLINE  Final   Special Requests   Final    BOTTLES DRAWN AEROBIC AND ANAEROBIC Blood  Culture adequate volume   Culture NO GROWTH 1 DAY  Final   Report Status PENDING  Incomplete  Urine Culture     Status: None   Collection Time: 12/22/16 10:30 AM  Result Value Ref Range Status   Specimen Description URINE, RANDOM  Final   Special Requests NONE  Final   Culture NO GROWTH  Final   Report Status 12/23/2016 FINAL  Final  Gram stain     Status: None   Collection Time: 12/23/16 12:28 PM  Result Value Ref Range Status   Specimen Description FLUID RIGHT PLEURAL  Final   Special Requests NONE  Final   Gram Stain   Final    MODERATE WBC PRESENT,BOTH PMN AND MONONUCLEAR NO ORGANISMS SEEN    Report Status 12/23/2016 FINAL  Final         Radiology Studies: Dg Chest 1 View  Result Date: 12/23/2016  CLINICAL DATA:  Short of breath.  Post right thoracentesis EXAM: CHEST 1 VIEW COMPARISON:  12/21/2016 FINDINGS: Moderate right pleural effusion unchanged. Extensive right lower lobe airspace disease unchanged. Negative for pneumothorax. Mild left lower lobe atelectasis. No left effusion. Pigtail drainage catheter right upper quadrant IMPRESSION: Negative for pneumothorax post right thoracentesis. Moderate right pleural effusion remains Bibasilar airspace disease right greater than left. Electronically Signed   By: Marlan Palau M.D.   On: 12/23/2016 12:53   Ct Chest Wo Contrast  Result Date: 12/22/2016 CLINICAL DATA:  Inpatient. Status post cholecystectomy 12/03/2016 complicated by gallbladder fossa and right perihepatic complex collection requiring percutaneous drain placement 12/08/2016. Persistent right chest and back pain. Follow-up right pleural effusion. EXAM: CT CHEST WITHOUT CONTRAST TECHNIQUE: Multidetector CT imaging of the chest was performed following the standard protocol without IV contrast. COMPARISON:  Chest radiograph from one day prior. 12/20/2016 CT abdomen/pelvis. FINDINGS: Cardiovascular: Normal heart size. No significant pericardial fluid/thickening. Great  vessels are normal in course and caliber. Mediastinum/Nodes: No discrete thyroid nodules. Unremarkable esophagus. No pathologically enlarged axillary, mediastinal or gross hilar lymph nodes, noting limited sensitivity for the detection of hilar adenopathy on this noncontrast study. Lungs/Pleura: No pneumothorax. No left pleural effusion. Moderate to large dependent right pleural effusion, increased since 12/20/2016 CT abdomen study. Complete right lower lobe compressive atelectasis. Segmental medial right middle lobe compressive atelectasis. Segmental medial right upper lobe atelectasis. Mild-to-moderate platelike left lower lobe atelectasis. Subsegmental lingular atelectasis. Otherwise no acute consolidative airspace disease, lung masses or significant pulmonary nodules in the limited aerated portions of the lungs. Upper abdomen: Mild-to-moderate elevation of the right hemidiaphragm. Percutaneous pigtail drainage catheter terminates anteriorly in the perihepatic space, unchanged. Crescentic right perihepatic collection measuring up to 4.0 cm thickness (series 7/ image 50), increased from 2.5 cm on 12/20/2016 CT abdomen study. Slightly increased mass-effect on the peripheral right liver lobe capsule by the perihepatic collection. Expected gas within the nondependent portion of the right perihepatic collection. Partial visualization of the tip of a JP drain in the intersegmental fissure anteriorly. Musculoskeletal: No aggressive appearing focal osseous lesions. Mild thoracic spondylosis. IMPRESSION: 1. Moderate to large dependent right pleural effusion, increased since 12/20/2016 CT abdomen study . 2. Complete right lower lobe and mild-to-moderate right middle lobe, right upper lobe and left lower lobe atelectasis. 3. Partially visualized right perihepatic collection appears mildly increased in size since 12/20/2016 CT abdomen study. Stable position of right upper quadrant percutaneous pigtail drain within the  ventral portion of this collection. Electronically Signed   By: Delbert Phenix M.D.   On: 12/22/2016 13:50   Ir Guided Horace Porteous W Catheter Placement  Result Date: 12/23/2016 CLINICAL DATA:  History of bile leak following cholecystectomy, post percutaneous drainage catheter placement by Dr. Bonnielee Haff on 12/08/2016. Subsequent abdominal CT performed 12/22/2016 demonstrates resolution of the fluid around the percutaneous drainage catheter however residual fluid is seen about the level of the gallbladder fossa. As such, fluoroscopic guided drainage catheter exchange and repositioning has been requested. EXAM: ABSCESS DRAINAGE CATHETER EXCHANGE COMPARISON:  CT abdomen and pelvis - 12/22/2016; 12/20/2016; 12/07/2016; CT-guided percutaneous drainage catheter placement -12/08/2016 CONTRAST:  10 cc Isovue 300, administered via the existing percutaneous drainage catheter MEDICATIONS: None. The patient is currently admitted to the hospital and receiving intravenous antibiotics. Antibiotics were administered within an appropriate time frame prior to the initiation of the procedure. ANESTHESIA/SEDATION: None FLUOROSCOPY TIME:  1 minute 18 seconds (16 mGy) FINDINGS: With the patient positioned left lateral decubitus on the fluoroscopy table, the external  portion of the existing right upper quadrant percutaneous drainage catheter nodule with surrounding skin was prepped and draped in usual sterile fashion. A preprocedural spot fluoroscopic image was obtained of the right upper abdominal quadrant existing percutaneous drainage catheter. A small amount of contrast was injected via the existing percutaneous drainage catheter and several fluoroscopic images were obtained in various obliquities. The external portion of the percutaneous drainage catheter was cut and cannulated with a short Amplatz wire. Under intermittent fluoroscopic guidance, the existing percutaneous drainage catheter was exchanged for a new 12 French percutaneous  drainage catheter which was repositioned more caudally at the level of the gallbladder fossa. Contrast injection confirmed appropriate positioning and functionality of the drainage catheter. The percutaneous drainage catheter was secured in place within interrupted suture and a StatLock device. The drain was connected to a gravity bag. A dressing was placed. The patient tolerated the procedure well without immediate postprocedural complication. IMPRESSION: Successful fluoroscopic guided repositioning of right upper quadrant abdominal percutaneous drainage catheter, now positioned at the level the gallbladder fossa. Repositioning yielded return of bilious appearing fluid. Electronically Signed   By: Simonne Come M.D.   On: 12/23/2016 15:36   Dg Chest Port 1 View  Result Date: 12/21/2016 CLINICAL DATA:  Shortness of breath, chest pain EXAM: PORTABLE CHEST 1 VIEW COMPARISON:  12/20/2016 FINDINGS: Increasing atelectasis or infiltrate at the left lung base. Small moderate right pleural effusion with dense consolidation at the right base. Cardiomegaly with vascular congestion. No pneumothorax. Drainage catheter in the right upper quadrant with surgical clips. Vague lucency at the right base IMPRESSION: 1. Vague lucency at the right base, uncertain if this is related to pleural effusion along the fissure creating false lucency at the right base versus small amount of pneumoperitoneum. CT chest would help to clarify this finding 2. Worsening atelectasis or infiltrate at the left base 3. Small moderate right pleural effusion 4. Cardiomegaly with vascular congestion Electronically Signed   By: Jasmine Pang M.D.   On: 12/21/2016 18:33   Ir Thoracentesis Asp Pleural Space W/img Guide  Result Date: 12/23/2016 INDICATION: History of cholecystectomy complicated by development of bile leak, now with enlarging symptomatic right-sided pleural effusion. Please from ultrasound-guided thoracentesis for therapeutic and  diagnostic purposes. EXAM: IR THORACENTESIS ASP PLEURAL SPACE W/IMG GUIDE COMPARISON:  Chest CT - 12/23/2026 MEDICATIONS: None. COMPLICATIONS: None immediate. TECHNIQUE: Informed written consent was obtained from the patient after a discussion of the risks, benefits and alternatives to treatment. A timeout was performed prior to the initiation of the procedure. Initial ultrasound scanning demonstrates a small to moderate-sized right-sided pleural effusion. The lower chest was prepped and draped in the usual sterile fashion. 1% lidocaine was used for local anesthesia. An ultrasound image was saved for documentation purposes. An 8 Fr Safe-T-Centesis catheter was introduced. The thoracentesis was performed. Note, despite there being a small amount of residual fluid, the procedure was terminated early due to patient's chest discomfort. The catheter was removed and a dressing was applied. The patient tolerated the procedure well without immediate post procedural complication. The patient was escorted to have an upright chest radiograph. FINDINGS: A total of approximately 850 cc cc of serous fluid was removed. Requested samples were sent to the laboratory. IMPRESSION: Successful ultrasound-guided right sided thoracentesis yielding 800 cc of pleural fluid. Electronically Signed   By: Simonne Come M.D.   On: 12/23/2016 15:30        Scheduled Meds: . acetaminophen  1,000 mg Oral Q8H  . docusate sodium  100 mg Oral BID  . heparin subcutaneous  5,000 Units Subcutaneous Q8H  . lidocaine      . metoprolol succinate  25 mg Oral Q breakfast  . multivitamins with iron  1 tablet Oral QPC supper  . pantoprazole  40 mg Oral Daily   Continuous Infusions: . sodium chloride 75 mL/hr at 12/23/16 0848  . piperacillin-tazobactam (ZOSYN)  IV 3.375 g (12/23/16 1310)  . vancomycin Stopped (12/23/16 1409)     LOS: 20 days    Time spent: 47 Minutes    Berton Mount, MD  [facility] Pager #: [phone] 7PM-7AM contact night coverage as above

## 2016-12-24 LAB — CBC
HCT: 29.2 % — ABNORMAL LOW (ref 39.0–52.0)
Hemoglobin: 8.8 g/dL — ABNORMAL LOW (ref 13.0–17.0)
MCH: 27.2 pg (ref 26.0–34.0)
MCHC: 30.1 g/dL (ref 30.0–36.0)
MCV: 90.1 fL (ref 78.0–100.0)
PLATELETS: 494 10*3/uL — AB (ref 150–400)
RBC: 3.24 MIL/uL — ABNORMAL LOW (ref 4.22–5.81)
RDW: 14.9 % (ref 11.5–15.5)
WBC: 10.3 10*3/uL (ref 4.0–10.5)

## 2016-12-24 LAB — COMPREHENSIVE METABOLIC PANEL
ALBUMIN: 1.9 g/dL — AB (ref 3.5–5.0)
ALK PHOS: 108 U/L (ref 38–126)
ALT: 12 U/L — ABNORMAL LOW (ref 17–63)
ANION GAP: 7 (ref 5–15)
AST: 15 U/L (ref 15–41)
BUN: 6 mg/dL (ref 6–20)
CALCIUM: 7.5 mg/dL — AB (ref 8.9–10.3)
CHLORIDE: 100 mmol/L — AB (ref 101–111)
CO2: 26 mmol/L (ref 22–32)
Creatinine, Ser: 0.8 mg/dL (ref 0.61–1.24)
GFR calc non Af Amer: >60 mL/min (ref >60)
Glucose, Bld: 97 mg/dL (ref 65–99)
POTASSIUM: 3.6 mmol/L (ref 3.5–5.1)
SODIUM: 133 mmol/L — AB (ref 135–145)
Total Bilirubin: 0.5 mg/dL (ref 0.3–1.2)
Total Protein: 5.5 g/dL — ABNORMAL LOW (ref 6.5–8.1)

## 2016-12-24 LAB — VANCOMYCIN, TROUGH: Vancomycin Tr: 12 ug/mL — ABNORMAL LOW (ref 15–20)

## 2016-12-24 MED ORDER — VANCOMYCIN HCL 10 G IV SOLR
1500.0000 mg | Freq: Three times a day (TID) | INTRAVENOUS | Status: DC
  Administered 2016-12-24 – 2016-12-25 (×2): 1500 mg via INTRAVENOUS
  Filled 2016-12-24 (×5): qty 1500

## 2016-12-24 NOTE — Progress Notes (Signed)
Pharmacy Antibiotic Note  Bobby Kelly is a 36 y.o. male with gangrenous cholecystitis s/p ERCP 11/12 with drains in place currently on IV vancomycin and zosyn. Renal function remains stable. WBC wnl. A 6-hr VT is sub-therapeutic at 12.   Vancomycin trough goal 15-20  Plan: 1) Increase vancomycin 1500 mg IV q8 2) Continue Zosyn 3.375g IV q8 (EI) 3) Follow renal function, cultures, LOT, level if needed  Height: 5\' 5"  (165.1 cm) Weight: 192 lb 3.2 oz (87.2 kg) IBW/kg (Calculated) : 61.5  Temp (24hrs), Avg:98.5 F (36.9 C), Min:98.4 F (36.9 C), Max:98.5 F (36.9 C)  Recent Labs  Lab 12/20/16 0325 12/21/16 0500 12/21/16 1841 12/22/16 0411 12/22/16 1308 12/22/16 1547 12/23/16 0214 12/24/16 0402 12/24/16 1015  WBC 11.0* 13.4*  --  13.8*  --   --  13.5* 10.3  --   CREATININE 0.98 0.84 0.93 0.90  --   --  0.84 0.80  --   LATICACIDVEN  --   --   --   --  1.0 1.3  --   --   --   VANCOTROUGH  --   --   --   --   --   --   --   --  12*    Estimated Creatinine Clearance: 132.1 mL/min (by C-G formula based on SCr of 0.8 mg/dL).    No Known Allergies  Antimicrobials this admission: CTX 11/26 >> 11/30 Zosyn 11/30 >> 11/13, resume 11/14 >> Augmentin 11/14 x 1 dose Vancomycin 11/14 >>  Dose adjustments this admission: n/a  Microbiology results: 11/15 BCx - ngtd 11/16 Pleural fluid negF  11/15 UCx negF   Thank you for allowing pharmacy to be a part of this patient's care.  Vinnie Level, PharmD., BCPS Clinical Pharmacist Pager [phone]

## 2016-12-24 NOTE — Progress Notes (Signed)
Referring Physician(s): Thompson,B  Supervising Physician: Oley BalmHassell, Daniel  Patient Status:  [facility]  Chief Complaint:  biloma  Subjective: Patient sitting up in bed, eating breakfast; states breathing has improved; still with some abd soreness; denies N/V   Allergies: Patient has no known allergies.  Medications: Prior to Admission medications   Medication Sig Start Date End Date Taking? Authorizing Provider  lisinopril (PRINIVIL,ZESTRIL) 20 MG tablet Take 1 tablet (20 mg total) by mouth daily. 10/17/16 01/15/17 Yes Camnitz, Will Daphine DeutscherMartin, MD  metoprolol succinate (TOPROL-XL) 50 MG 24 hr tablet Take 1 tablet (50 mg total) by mouth daily. Take with or immediately following a meal. 10/17/16  Yes Camnitz, Will Daphine DeutscherMartin, MD  ondansetron (ZOFRAN ODT) 4 MG disintegrating tablet Take 1 tablet (4 mg total) by mouth every 8 (eight) hours as needed for nausea or vomiting. 11/26/16  Yes Khatri, Hina, PA-C  oxyCODONE-acetaminophen (PERCOCET/ROXICET) 5-325 MG tablet Take 1 tablet by mouth every 8 (eight) hours as needed for severe pain. 11/26/16  Yes Khatri, Hina, PA-C  pravastatin (PRAVACHOL) 20 MG tablet Take 1 tablet (20 mg total) by mouth every evening. 11/07/16 11/07/17 Yes Tysinger, Kermit Baloavid S, PA-C     Vital Signs: BP 117/68 (BP Location: Right Arm)   Pulse (!) 107   Temp 98.4 F (36.9 C) (Oral)   Resp 18   Ht 5\' 5"  (1.651 m)   Wt 192 lb 3.2 oz (87.2 kg)   SpO2 95%   BMI 31.98 kg/m   Physical Exam awake, alert.  Right abdominal drains intact, lateral drain with 20 cc output, upper quadrant drain with 590 cc;   Imaging: Dg Chest 1 View  Result Date: 12/23/2016 CLINICAL DATA:  Short of breath.  Post right thoracentesis EXAM: CHEST 1 VIEW COMPARISON:  12/21/2016 FINDINGS: Moderate right pleural effusion unchanged. Extensive right lower lobe airspace disease unchanged. Negative for pneumothorax. Mild left lower lobe atelectasis. No left effusion. Pigtail drainage catheter right  upper quadrant IMPRESSION: Negative for pneumothorax post right thoracentesis. Moderate right pleural effusion remains Bibasilar airspace disease right greater than left. Electronically Signed   By: Marlan Palauharles  Clark M.D.   On: 12/23/2016 12:53   Ct Chest Wo Contrast  Result Date: 12/22/2016 CLINICAL DATA:  Inpatient. Status post cholecystectomy 12/03/2016 complicated by gallbladder fossa and right perihepatic complex collection requiring percutaneous drain placement 12/08/2016. Persistent right chest and back pain. Follow-up right pleural effusion. EXAM: CT CHEST WITHOUT CONTRAST TECHNIQUE: Multidetector CT imaging of the chest was performed following the standard protocol without IV contrast. COMPARISON:  Chest radiograph from one day prior. 12/20/2016 CT abdomen/pelvis. FINDINGS: Cardiovascular: Normal heart size. No significant pericardial fluid/thickening. Great vessels are normal in course and caliber. Mediastinum/Nodes: No discrete thyroid nodules. Unremarkable esophagus. No pathologically enlarged axillary, mediastinal or gross hilar lymph nodes, noting limited sensitivity for the detection of hilar adenopathy on this noncontrast study. Lungs/Pleura: No pneumothorax. No left pleural effusion. Moderate to large dependent right pleural effusion, increased since 12/20/2016 CT abdomen study. Complete right lower lobe compressive atelectasis. Segmental medial right middle lobe compressive atelectasis. Segmental medial right upper lobe atelectasis. Mild-to-moderate platelike left lower lobe atelectasis. Subsegmental lingular atelectasis. Otherwise no acute consolidative airspace disease, lung masses or significant pulmonary nodules in the limited aerated portions of the lungs. Upper abdomen: Mild-to-moderate elevation of the right hemidiaphragm. Percutaneous pigtail drainage catheter terminates anteriorly in the perihepatic space, unchanged. Crescentic right perihepatic collection measuring up to 4.0 cm  thickness (series 7/ image 50), increased from 2.5 cm on 12/20/2016  CT abdomen study. Slightly increased mass-effect on the peripheral right liver lobe capsule by the perihepatic collection. Expected gas within the nondependent portion of the right perihepatic collection. Partial visualization of the tip of a JP drain in the intersegmental fissure anteriorly. Musculoskeletal: No aggressive appearing focal osseous lesions. Mild thoracic spondylosis. IMPRESSION: 1. Moderate to large dependent right pleural effusion, increased since 12/20/2016 CT abdomen study . 2. Complete right lower lobe and mild-to-moderate right middle lobe, right upper lobe and left lower lobe atelectasis. 3. Partially visualized right perihepatic collection appears mildly increased in size since 12/20/2016 CT abdomen study. Stable position of right upper quadrant percutaneous pigtail drain within the ventral portion of this collection. Electronically Signed   By: Delbert Phenix M.D.   On: 12/22/2016 13:50   Ct Abdomen Pelvis W Contrast  Result Date: 12/21/2016 CLINICAL DATA:  Patient had a drain placed on 12/09/11, he woke up with back pain in his hips, up into his shoulders and neck. abnormal xray suspicious for subdiaphragmatic air^ EXAM: CT ABDOMEN AND PELVIS WITH CONTRAST TECHNIQUE: Multidetector CT imaging of the abdomen and pelvis was performed using the standard protocol following bolus administration of intravenous contrast. CONTRAST:  ISOVUE-300 IOPAMIDOL (ISOVUE-300) INJECTION 61% COMPARISON:  12/07/2016 FINDINGS: Lower chest: Interval increase in moderate right pleural effusion and Consolidation/ atelectasis at the right lung base.No pneumothorax. Hepatobiliary: Interval placement of right subdiaphragmatic/ perihepatic drain with decompression of the dominant component of the perihepatic collection. A few gas bubbles in the residual loculated fluid collection presumably from flushing regimen. No liver parenchymal lesion.  Cholecystectomy clips. Plastic biliary stent has been placed, with scattered gas in the central intrahepatic biliary tree implying patency. No biliary ductal dilatation. Pancreas: Unremarkable. No pancreatic ductal dilatation or surrounding inflammatory changes. Spleen: Normal in size without focal abnormality. Adrenals/Urinary Tract: Normal adrenals. Kidneys unremarkable. No hydronephrosis or focal renal lesion. Urinary bladder incompletely distended. Stomach/Bowel: Stomach is incompletely distended. Small bowel decompressed. Appendix surgically absent. Colon is nondilated, unremarkable. Vascular/Lymphatic: No abdominal or pelvic adenopathy. No significant vascular pathology identified. Reproductive: Prostate is unremarkable. Other: Trace pelvic ascites. No free air. Right anterior peritoneal surgical drain catheter remains in place with an adjacent complex loculated anterior peritoneal collection measuring 15.3 x 5.4 cm maximum transverse dimensions (previously 17.4 x 6.4). No new abdominal fluid collections. Musculoskeletal: No acute or significant osseous findings. IMPRESSION: 1. No free air. 2. Interval pigtail drain placement into the perihepatic loculated complex collection with partial decompression, without apparent complication. 3. Stable surgical drain with slight decrease in size of adjacent complex fluid collection or hematoma. 4. Increase in moderate right pleural effusion and adjacent right lower lung consolidation/atelectasis. 5. Interval plastic biliary stent placement without apparent complication. Electronically Signed   By: Corlis Leak M.D.   On: 12/21/2016 08:38   Ir Guided Horace Porteous W Catheter Placement  Result Date: 12/23/2016 CLINICAL DATA:  History of bile leak following cholecystectomy, post percutaneous drainage catheter placement by Dr. Bonnielee Haff on 12/08/2016. Subsequent abdominal CT performed 12/22/2016 demonstrates resolution of the fluid around the percutaneous drainage catheter however  residual fluid is seen about the level of the gallbladder fossa. As such, fluoroscopic guided drainage catheter exchange and repositioning has been requested. EXAM: ABSCESS DRAINAGE CATHETER EXCHANGE COMPARISON:  CT abdomen and pelvis - 12/22/2016; 12/20/2016; 12/07/2016; CT-guided percutaneous drainage catheter placement -12/08/2016 CONTRAST:  10 cc Isovue 300, administered via the existing percutaneous drainage catheter MEDICATIONS: None. The patient is currently admitted to the hospital and receiving intravenous antibiotics. Antibiotics were administered within  an appropriate time frame prior to the initiation of the procedure. ANESTHESIA/SEDATION: None FLUOROSCOPY TIME:  1 minute 18 seconds (16 mGy) FINDINGS: With the patient positioned left lateral decubitus on the fluoroscopy table, the external portion of the existing right upper quadrant percutaneous drainage catheter nodule with surrounding skin was prepped and draped in usual sterile fashion. A preprocedural spot fluoroscopic image was obtained of the right upper abdominal quadrant existing percutaneous drainage catheter. A small amount of contrast was injected via the existing percutaneous drainage catheter and several fluoroscopic images were obtained in various obliquities. The external portion of the percutaneous drainage catheter was cut and cannulated with a short Amplatz wire. Under intermittent fluoroscopic guidance, the existing percutaneous drainage catheter was exchanged for a new 12 French percutaneous drainage catheter which was repositioned more caudally at the level of the gallbladder fossa. Contrast injection confirmed appropriate positioning and functionality of the drainage catheter. The percutaneous drainage catheter was secured in place within interrupted suture and a StatLock device. The drain was connected to a gravity bag. A dressing was placed. The patient tolerated the procedure well without immediate postprocedural complication.  IMPRESSION: Successful fluoroscopic guided repositioning of right upper quadrant abdominal percutaneous drainage catheter, now positioned at the level the gallbladder fossa. Repositioning yielded return of bilious appearing fluid. Electronically Signed   By: Simonne Come M.D.   On: 12/23/2016 15:36   Dg Chest Port 1 View  Result Date: 12/21/2016 CLINICAL DATA:  Shortness of breath, chest pain EXAM: PORTABLE CHEST 1 VIEW COMPARISON:  12/20/2016 FINDINGS: Increasing atelectasis or infiltrate at the left lung base. Small moderate right pleural effusion with dense consolidation at the right base. Cardiomegaly with vascular congestion. No pneumothorax. Drainage catheter in the right upper quadrant with surgical clips. Vague lucency at the right base IMPRESSION: 1. Vague lucency at the right base, uncertain if this is related to pleural effusion along the fissure creating false lucency at the right base versus small amount of pneumoperitoneum. CT chest would help to clarify this finding 2. Worsening atelectasis or infiltrate at the left base 3. Small moderate right pleural effusion 4. Cardiomegaly with vascular congestion Electronically Signed   By: Jasmine Pang M.D.   On: 12/21/2016 18:33   Dg Abd Acute W/chest  Result Date: 12/20/2016 CLINICAL DATA:  Abdominal pain and nausea. Previous cholecystectomy and appendectomy. EXAM: DG ABDOMEN ACUTE W/ 1V CHEST COMPARISON:  Chest x-ray of December 09, 2014 FINDINGS: There is new volume loss at the right lung base. The right upper lobe is clear. The left lung is clear. There is no pulmonary vascular congestion. The cardiac silhouette is mildly enlarged but likely stable. Within the abdomen there is a pigtail drainage catheter in the right upper quadrant. There is a common bile duct stent in place. There is an additional drainage catheter which terminates superior to the superior margin of the common bile duct stent. There is a trace of gas in the right upper quadrant  which may lie within the subdiaphragmatic region or subpulmonic region. The bowel gas pattern is normal. There surgical clips in the gallbladder fossa. There is gentle spinal curvature convex toward the left centered at L1. IMPRESSION: Drainage tubes in place in the right upper quadrant. Possible small amount of subdiaphragmatic gas versus subpulmonic gas. New increased density at the right lung base consistent with atelectasis or pneumonia. Electronically Signed   By: David  [place] M.D.   On: 12/20/2016 14:07   Ir Thoracentesis Asp Pleural Space W/img Guide  Result Date:  12/23/2016 INDICATION: History of cholecystectomy complicated by development of bile leak, now with enlarging symptomatic right-sided pleural effusion. Please from ultrasound-guided thoracentesis for therapeutic and diagnostic purposes. EXAM: IR THORACENTESIS ASP PLEURAL SPACE W/IMG GUIDE COMPARISON:  Chest CT - 12/23/2026 MEDICATIONS: None. COMPLICATIONS: None immediate. TECHNIQUE: Informed written consent was obtained from the patient after a discussion of the risks, benefits and alternatives to treatment. A timeout was performed prior to the initiation of the procedure. Initial ultrasound scanning demonstrates a small to moderate-sized right-sided pleural effusion. The lower chest was prepped and draped in the usual sterile fashion. 1% lidocaine was used for local anesthesia. An ultrasound image was saved for documentation purposes. An 8 Fr Safe-T-Centesis catheter was introduced. The thoracentesis was performed. Note, despite there being a small amount of residual fluid, the procedure was terminated early due to patient's chest discomfort. The catheter was removed and a dressing was applied. The patient tolerated the procedure well without immediate post procedural complication. The patient was escorted to have an upright chest radiograph. FINDINGS: A total of approximately 850 cc cc of serous fluid was removed. Requested samples were sent  to the laboratory. IMPRESSION: Successful ultrasound-guided right sided thoracentesis yielding 800 cc of pleural fluid. Electronically Signed   By: Simonne Come M.D.   On: 12/23/2016 15:30    Labs:  CBC: Recent Labs    12/21/16 0500 12/22/16 0411 12/23/16 0214 12/24/16 0402  WBC 13.4* 13.8* 13.5* 10.3  HGB 10.6* 10.0* 9.3* 8.8*  HCT 34.2* 32.9* 30.6* 29.2*  PLT 585* 519* 451* 494*    COAGS: Recent Labs    12/08/16 0747 12/23/16 0214  INR 1.08 1.19    BMP: Recent Labs    12/21/16 1841 12/22/16 0411 12/23/16 0214 12/24/16 0402  NA 131* 133* 130* 133*  K 3.8 3.8 3.8 3.6  CL 100* 100* 99* 100*  CO2 23 26 24 26   GLUCOSE 138* 118* 112* 97  BUN 6 6 9 6   CALCIUM 8.1* 7.9* 7.4* 7.5*  CREATININE 0.93 0.90 0.84 0.80  GFRNONAA >60 >60 >60 >60  GFRAA >60 >60 >60 >60    LIVER FUNCTION TESTS: Recent Labs    12/20/16 0325 12/21/16 0500 12/21/16 1841 12/24/16 0402  BILITOT 0.8 0.8 0.7 0.5  AST 19 20 19 15   ALT 16* 16* 16* 12*  ALKPHOS 102 109 120 108  PROT 5.5* 5.8* 6.4* 5.5*  ALBUMIN 2.2* 2.2* 2.3* 1.9*    Assessment and Plan: Patient with history of bile leak following cholecystectomy, status post abdominal drain placement on 12/08/16; status post repositioning of right upper quadrant drain to level of gallbladder fossa and right thoracentesis on 11/16; afebrile; WBC normal, hemoglobin 8.8, creatinine normal; continue current treatment; monitor drain output   Electronically Signed: D. Jeananne Rama, PA-C 12/24/2016, 9:44 AM   I spent a total of 15 minutes at the the patient's bedside AND on the patient's hospital floor or unit, greater than 50% of which was counseling/coordinating care for biloma drain    Patient ID: Bobby Kelly, male   DOB: 08-06-1980, 36 y.o.   MRN: [id number]

## 2016-12-24 NOTE — Progress Notes (Signed)
5 Days Post-Op   Subjective/Chief Complaint: Feels a little better. Less short of breath   Objective: Vital signs in last 24 hours: Temp:  [98.4 F (36.9 C)-98.5 F (36.9 C)] 98.4 F (36.9 C) (11/17 0600) Pulse Rate:  [100-107] 107 (11/17 0857) Resp:  [16-18] 18 (11/17 0600) BP: (105-119)/(60-71) 117/68 (11/17 0857) SpO2:  [94 %-98 %] 95 % (11/17 0857) Weight:  [87.2 kg (192 lb 3.2 oz)] 87.2 kg (192 lb 3.2 oz) (11/17 0600) Last BM Date: 12/22/16  Intake/Output from previous day: 11/16 0701 - 11/17 0700 In: 1910 [P.O.:600; I.V.:750; IV Piggyback:550] Out: 3710 [Urine:3100; Drains:610] Intake/Output this shift: No intake/output data recorded.  General appearance: alert and cooperative Resp: clear to auscultation bilaterally Cardio: regular rate and rhythm GI: soft, mild tenderness. drain output looks more like old blood than bile  Lab Results:  Recent Labs    12/23/16 0214 12/24/16 0402  WBC 13.5* 10.3  HGB 9.3* 8.8*  HCT 30.6* 29.2*  PLT 451* 494*   BMET Recent Labs    12/23/16 0214 12/24/16 0402  NA 130* 133*  K 3.8 3.6  CL 99* 100*  CO2 24 26  GLUCOSE 112* 97  BUN 9 6  CREATININE 0.84 0.80  CALCIUM 7.4* 7.5*   PT/INR Recent Labs    12/23/16 0214  LABPROT 15.0  INR 1.19   ABG No results for input(s): PHART, HCO3 in the last 72 hours.  Invalid input(s): PCO2, PO2  Studies/Results: Dg Chest 1 View  Result Date: 12/23/2016 CLINICAL DATA:  Short of breath.  Post right thoracentesis EXAM: CHEST 1 VIEW COMPARISON:  12/21/2016 FINDINGS: Moderate right pleural effusion unchanged. Extensive right lower lobe airspace disease unchanged. Negative for pneumothorax. Mild left lower lobe atelectasis. No left effusion. Pigtail drainage catheter right upper quadrant IMPRESSION: Negative for pneumothorax post right thoracentesis. Moderate right pleural effusion remains Bibasilar airspace disease right greater than left. Electronically Signed   By: Marlan Palauharles  Clark  M.D.   On: 12/23/2016 12:53   Ct Chest Wo Contrast  Result Date: 12/22/2016 CLINICAL DATA:  Inpatient. Status post cholecystectomy 12/03/2016 complicated by gallbladder fossa and right perihepatic complex collection requiring percutaneous drain placement 12/08/2016. Persistent right chest and back pain. Follow-up right pleural effusion. EXAM: CT CHEST WITHOUT CONTRAST TECHNIQUE: Multidetector CT imaging of the chest was performed following the standard protocol without IV contrast. COMPARISON:  Chest radiograph from one day prior. 12/20/2016 CT abdomen/pelvis. FINDINGS: Cardiovascular: Normal heart size. No significant pericardial fluid/thickening. Great vessels are normal in course and caliber. Mediastinum/Nodes: No discrete thyroid nodules. Unremarkable esophagus. No pathologically enlarged axillary, mediastinal or gross hilar lymph nodes, noting limited sensitivity for the detection of hilar adenopathy on this noncontrast study. Lungs/Pleura: No pneumothorax. No left pleural effusion. Moderate to large dependent right pleural effusion, increased since 12/20/2016 CT abdomen study. Complete right lower lobe compressive atelectasis. Segmental medial right middle lobe compressive atelectasis. Segmental medial right upper lobe atelectasis. Mild-to-moderate platelike left lower lobe atelectasis. Subsegmental lingular atelectasis. Otherwise no acute consolidative airspace disease, lung masses or significant pulmonary nodules in the limited aerated portions of the lungs. Upper abdomen: Mild-to-moderate elevation of the right hemidiaphragm. Percutaneous pigtail drainage catheter terminates anteriorly in the perihepatic space, unchanged. Crescentic right perihepatic collection measuring up to 4.0 cm thickness (series 7/ image 50), increased from 2.5 cm on 12/20/2016 CT abdomen study. Slightly increased mass-effect on the peripheral right liver lobe capsule by the perihepatic collection. Expected gas within the  nondependent portion of the right perihepatic collection. Partial visualization  of the tip of a JP drain in the intersegmental fissure anteriorly. Musculoskeletal: No aggressive appearing focal osseous lesions. Mild thoracic spondylosis. IMPRESSION: 1. Moderate to large dependent right pleural effusion, increased since 12/20/2016 CT abdomen study . 2. Complete right lower lobe and mild-to-moderate right middle lobe, right upper lobe and left lower lobe atelectasis. 3. Partially visualized right perihepatic collection appears mildly increased in size since 12/20/2016 CT abdomen study. Stable position of right upper quadrant percutaneous pigtail drain within the ventral portion of this collection. Electronically Signed   By: Delbert Phenix M.D.   On: 12/22/2016 13:50   Ir Guided Horace Porteous W Catheter Placement  Result Date: 12/23/2016 CLINICAL DATA:  History of bile leak following cholecystectomy, post percutaneous drainage catheter placement by Dr. Bonnielee Haff on 12/08/2016. Subsequent abdominal CT performed 12/22/2016 demonstrates resolution of the fluid around the percutaneous drainage catheter however residual fluid is seen about the level of the gallbladder fossa. As such, fluoroscopic guided drainage catheter exchange and repositioning has been requested. EXAM: ABSCESS DRAINAGE CATHETER EXCHANGE COMPARISON:  CT abdomen and pelvis - 12/22/2016; 12/20/2016; 12/07/2016; CT-guided percutaneous drainage catheter placement -12/08/2016 CONTRAST:  10 cc Isovue 300, administered via the existing percutaneous drainage catheter MEDICATIONS: None. The patient is currently admitted to the hospital and receiving intravenous antibiotics. Antibiotics were administered within an appropriate time frame prior to the initiation of the procedure. ANESTHESIA/SEDATION: None FLUOROSCOPY TIME:  1 minute 18 seconds (16 mGy) FINDINGS: With the patient positioned left lateral decubitus on the fluoroscopy table, the external portion of the existing  right upper quadrant percutaneous drainage catheter nodule with surrounding skin was prepped and draped in usual sterile fashion. A preprocedural spot fluoroscopic image was obtained of the right upper abdominal quadrant existing percutaneous drainage catheter. A small amount of contrast was injected via the existing percutaneous drainage catheter and several fluoroscopic images were obtained in various obliquities. The external portion of the percutaneous drainage catheter was cut and cannulated with a short Amplatz wire. Under intermittent fluoroscopic guidance, the existing percutaneous drainage catheter was exchanged for a new 12 French percutaneous drainage catheter which was repositioned more caudally at the level of the gallbladder fossa. Contrast injection confirmed appropriate positioning and functionality of the drainage catheter. The percutaneous drainage catheter was secured in place within interrupted suture and a StatLock device. The drain was connected to a gravity bag. A dressing was placed. The patient tolerated the procedure well without immediate postprocedural complication. IMPRESSION: Successful fluoroscopic guided repositioning of right upper quadrant abdominal percutaneous drainage catheter, now positioned at the level the gallbladder fossa. Repositioning yielded return of bilious appearing fluid. Electronically Signed   By: Simonne Come M.D.   On: 12/23/2016 15:36   Ir Thoracentesis Asp Pleural Space W/img Guide  Result Date: 12/23/2016 INDICATION: History of cholecystectomy complicated by development of bile leak, now with enlarging symptomatic right-sided pleural effusion. Please from ultrasound-guided thoracentesis for therapeutic and diagnostic purposes. EXAM: IR THORACENTESIS ASP PLEURAL SPACE W/IMG GUIDE COMPARISON:  Chest CT - 12/23/2026 MEDICATIONS: None. COMPLICATIONS: None immediate. TECHNIQUE: Informed written consent was obtained from the patient after a discussion of the  risks, benefits and alternatives to treatment. A timeout was performed prior to the initiation of the procedure. Initial ultrasound scanning demonstrates a small to moderate-sized right-sided pleural effusion. The lower chest was prepped and draped in the usual sterile fashion. 1% lidocaine was used for local anesthesia. An ultrasound image was saved for documentation purposes. An 8 Fr Safe-T-Centesis catheter was introduced. The thoracentesis was performed. Note,  despite there being a small amount of residual fluid, the procedure was terminated early due to patient's chest discomfort. The catheter was removed and a dressing was applied. The patient tolerated the procedure well without immediate post procedural complication. The patient was escorted to have an upright chest radiograph. FINDINGS: A total of approximately 850 cc cc of serous fluid was removed. Requested samples were sent to the laboratory. IMPRESSION: Successful ultrasound-guided right sided thoracentesis yielding 800 cc of pleural fluid. Electronically Signed   By: Simonne Come M.D.   On: 12/23/2016 15:30    Anti-infectives: Anti-infectives (From admission, onward)   Start     Dose/Rate Route Frequency Ordered Stop   12/21/16 1930  piperacillin-tazobactam (ZOSYN) IVPB 3.375 g     3.375 g 12.5 mL/hr over 240 Minutes Intravenous Every 8 hours 12/21/16 1900     12/21/16 1930  vancomycin (VANCOCIN) IVPB 1000 mg/200 mL premix     1,000 mg 200 mL/hr over 60 Minutes Intravenous Every 8 hours 12/21/16 1900     12/21/16 1015  amoxicillin-clavulanate (AUGMENTIN) 875-125 MG per tablet 1 tablet  Status:  Discontinued     1 tablet Oral Every 12 hours 12/21/16 1012 12/21/16 1854   12/07/16 0830  piperacillin-tazobactam (ZOSYN) IVPB 3.375 g     3.375 g 12.5 mL/hr over 240 Minutes Intravenous Every 8 hours 12/07/16 0736 12/20/16 2359   12/02/16 1500  cefTRIAXone (ROCEPHIN) 2 g in dextrose 5 % 50 mL IVPB  Status:  Discontinued     2 g 100 mL/hr over  30 Minutes Intravenous Every 24 hours 12/02/16 1337 12/07/16 0736      Assessment/Plan: s/p Procedure(s): ENDOSCOPIC RETROGRADE CHOLANGIOPANCREATOGRAPHY (ERCP) (N/A) Advance diet  Continue abx and drain Gangrenous cholecystitis with cholelithiasis  Laparoscopic cholecystectomy, 19 French Blake drain placement, gangrenous changes involving the entirety of the back wall, short cystic duct precluded cholangiogram10/27/18, Dr. Violeta Gelinas CT 10/31: 19.5 cm fluid collection - Biloma/abscess 17.4 cm possible hematoma IR drain Placed 12/08/16, 1050 In drain on return from the floor and 2035 ml total yesterday.  Drain 360/24h ERCP attempt 11/6 Dr. Dulce Sellar - unable to do (food in stomach) ERCP attempt 11/8 Dr. Dulce Sellar - unable to do (throat edema) ERCP 10Fr 7 cm drain placed, sphincterotomy 12/19/16, Dr. Ewing Schlein Fever and increasing leukocytosis - TM 102.5 PM 12/21/16 Plan drain repositioning by IR 12/23/16  RLL pleural effusion/atelectasis - IS CXR PM 11/14:Worsening atelectasis or infiltrate at the left base Small moderate right pleural effusion Cardiomegaly with vascular congestion Vancomycin and Zosyn restarted PM 11/14 Healthcare associated pneumonia -cultures pending -strep pneumoniae urinary antigen negative CT of the chest 12/22/16: Moderate to large dependent right pleural effusion; complete right lower lobe middle lobe and left lower lobe atelectasis.  Increased right perihepatic collection, compared to 12/20/2016. Acute kidney injury: resolved  Anemia -stable  Hyponatremia -normal saline IV fluid restarted 12/23/16  Active problems: Hypertension - Toprol -25mg  QD Hx of Borderline CHF - Grade 2 diastolic dysfunction/EF 44% Hyperlipidemia History of thyroid disease ZOX:WRUE diet ID: Rocephin 10/26 -10/31. Zosyn 10/31- 11/13; recurrent fever =>> 11/14 Vancomycin/Zosyn DVT: SCD's, SQ hep Follow up: Dr Janee Morn    LOS: 21 days    TOTH III,Andriana Casa  S 12/24/2016

## 2016-12-24 NOTE — Progress Notes (Signed)
PROGRESS NOTE    Bobby Kelly  ZDG:[id number] DOB: January 12, 1981 DOA: 12/02/2016 PCP: Jac Canavan, PA-C  Outpatient Specialists:     Brief Narrative:  Patient is a 36 year old Male, admitted 2 weeks ago with gangrenous cholecystitis status post cholecystectomy who developed a bile leak and underwent ERCP with drains and stents. In addition interventional radiology place perihepatic drain for abscess prior to that. Hospitalist was consulted as patient developed intermittent fevers hypertension leukocytosis and tachycardia. Patient underwent left sided thoracentesis today. Pleural fluid analysis is pending. Patient feels better and denied any constitutional symptoms. Patient is on Vanc and Zosyn.   Assessment & Plan:   Principal Problem:   HCAP (healthcare-associated pneumonia) Active Problems:   Acute calculous cholecystitis   Pleural effusion   CHF (congestive heart failure) (HCC)   Cholelithiasis  CT Scan revealed effusion and compressive atelectasis. Patient is status post thoracentesis. Follow fluid analysis. Continue work up. Likely exudative effusion Continue antibiotics Continue to monitor vitals, and other constitutional symptos Incentive spirometry   DVT prophylaxis: [place] Heparin Code Status: Full Family Communication: Wife Disposition Plan: Will depend on hospital course   Consultants:     Procedures:   Right sided thoracentesis  Antimicrobials:   Vanc  Zosyn    Subjective: Continues to feel better.  Objective: Vitals:   12/23/16 2035 12/24/16 0600 12/24/16 0857 12/24/16 1505  BP: 105/68 119/71 117/68 119/70  Pulse: (!) 101 (!) 102 (!) 107 (!) 108  Resp: 16 18  18   Temp: 98.5 F (36.9 C) 98.4 F (36.9 C)  98 F (36.7 C)  TempSrc: Oral Oral  Oral  SpO2: 94% 96% 95% 96%  Weight:  87.2 kg (192 lb 3.2 oz)    Height:        Intake/Output Summary (Last 24 hours) at 12/24/2016 1612 Last data filed at 12/24/2016 5956 Gross per 24 hour    Intake 1185 ml  Output 2200 ml  Net -1015 ml   Filed Weights   12/22/16 0457 12/23/16 0500 12/24/16 0600  Weight: 90.9 kg (200 lb 6.4 oz) 91.6 kg (201 lb 15.1 oz) 87.2 kg (192 lb 3.2 oz)    Examination:  General exam: Appears calm and comfortable HEENT - Pallor  Respiratory system: Decreased air entry right lung field. Cardiovascular system: S1 & S2. Gastrointestinal system: Abdomen has drains (right abdominal area). Central nervous system: Alert and oriented. No focal neurological deficits. Extremities: No leg edema. Psychiatry: Judgement and insight appear normal. Mood & affect appropriate.     Data Reviewed: I have personally reviewed following labs and imaging studies  CBC: Recent Labs  Lab 12/18/16 0433 12/20/16 0325 12/21/16 0500 12/22/16 0411 12/23/16 0214 12/24/16 0402  WBC 8.9 11.0* 13.4* 13.8* 13.5* 10.3  NEUTROABS 5.1 7.2  --   --   --   --   HGB 9.2* 9.2* 10.6* 10.0* 9.3* 8.8*  HCT 30.2* 29.9* 34.2* 32.9* 30.6* 29.2*  MCV 92.6 92.0 91.4 91.4 90.5 90.1  PLT 658* 657* 585* 519* 451* 494*   Basic Metabolic Panel: Recent Labs  Lab 12/21/16 0500 12/21/16 1841 12/22/16 0411 12/23/16 0214 12/24/16 0402  NA 134* 131* 133* 130* 133*  K 4.0 3.8 3.8 3.8 3.6  CL 103 100* 100* 99* 100*  CO2 25 23 26 24 26   GLUCOSE 93 138* 118* 112* 97  BUN 5* 6 6 9 6   CREATININE 0.84 0.93 0.90 0.84 0.80  CALCIUM 7.9* 8.1* 7.9* 7.4* 7.5*   GFR: Estimated Creatinine Clearance: 132.1 mL/min (by  C-G formula based on SCr of 0.8 mg/dL). Liver Function Tests: Recent Labs  Lab 12/18/16 0433 12/20/16 0325 12/21/16 0500 12/21/16 1841 12/24/16 0402  AST 16 19 20 19 15   ALT 22 16* 16* 16* 12*  ALKPHOS 116 102 109 120 108  BILITOT 0.8 0.8 0.8 0.7 0.5  PROT 5.6* 5.5* 5.8* 6.4* 5.5*  ALBUMIN 2.2* 2.2* 2.2* 2.3* 1.9*   Recent Labs  Lab 12/20/16 0325 12/21/16 1841  LIPASE 30 17   No results for input(s): AMMONIA in the last 168 hours. Coagulation Profile: Recent Labs   Lab 12/23/16 0214  INR 1.19   Cardiac Enzymes: No results for input(s): CKTOTAL, CKMB, CKMBINDEX, TROPONINI in the last 168 hours. BNP (last 3 results) No results for input(s): PROBNP in the last 8760 hours. HbA1C: No results for input(s): HGBA1C in the last 72 hours. CBG: No results for input(s): GLUCAP in the last 168 hours. Lipid Profile: No results for input(s): CHOL, HDL, LDLCALC, TRIG, CHOLHDL, LDLDIRECT in the last 72 hours. Thyroid Function Tests: No results for input(s): TSH, T4TOTAL, FREET4, T3FREE, THYROIDAB in the last 72 hours. Anemia Panel: No results for input(s): VITAMINB12, FOLATE, FERRITIN, TIBC, IRON, RETICCTPCT in the last 72 hours. Urine analysis:    Component Value Date/Time   COLORURINE AMBER (A) 12/22/2016 1030   APPEARANCEUR HAZY (A) 12/22/2016 1030   LABSPEC 1.043 (H) 12/22/2016 1030   LABSPEC 1.020 11/03/2016 0827   PHURINE 5.0 12/22/2016 1030   GLUCOSEU NEGATIVE 12/22/2016 1030   HGBUR NEGATIVE 12/22/2016 1030   BILIRUBINUR SMALL (A) 12/22/2016 1030   BILIRUBINUR negative 11/03/2016 0827   BILIRUBINUR neg 04/28/2016 1146   KETONESUR NEGATIVE 12/22/2016 1030   PROTEINUR 100 (A) 12/22/2016 1030   UROBILINOGEN negative 04/28/2016 1146   NITRITE NEGATIVE 12/22/2016 1030   LEUKOCYTESUR NEGATIVE 12/22/2016 1030   Sepsis Labs: @LABRCNTIP (procalcitonin:4,lacticidven:4)  ) Recent Results (from the past 240 hour(s))  Culture, blood (Routine X 2) w Reflex to ID Panel     Status: None (Preliminary result)   Collection Time: 12/22/16  8:43 AM  Result Value Ref Range Status   Specimen Description BLOOD RIGHT ANTECUBITAL  Final   Special Requests   Final    BOTTLES DRAWN AEROBIC AND ANAEROBIC Blood Culture adequate volume   Culture NO GROWTH 1 DAY  Final   Report Status PENDING  Incomplete  Culture, blood (Routine X 2) w Reflex to ID Panel     Status: None (Preliminary result)   Collection Time: 12/22/16  9:30 AM  Result Value Ref Range Status    Specimen Description BLOOD CENTRAL LINE MIDLINE  Final   Special Requests   Final    BOTTLES DRAWN AEROBIC AND ANAEROBIC Blood Culture adequate volume   Culture NO GROWTH 1 DAY  Final   Report Status PENDING  Incomplete  Urine Culture     Status: None   Collection Time: 12/22/16 10:30 AM  Result Value Ref Range Status   Specimen Description URINE, RANDOM  Final   Special Requests NONE  Final   Culture NO GROWTH  Final   Report Status 12/23/2016 FINAL  Final  Gram stain     Status: None   Collection Time: 12/23/16 12:28 PM  Result Value Ref Range Status   Specimen Description FLUID RIGHT PLEURAL  Final   Special Requests NONE  Final   Gram Stain   Final    MODERATE WBC PRESENT,BOTH PMN AND MONONUCLEAR NO ORGANISMS SEEN    Report Status 12/23/2016 FINAL  Final  Radiology Studies: Dg Chest 1 View  Result Date: 12/23/2016 CLINICAL DATA:  Short of breath.  Post right thoracentesis EXAM: CHEST 1 VIEW COMPARISON:  12/21/2016 FINDINGS: Moderate right pleural effusion unchanged. Extensive right lower lobe airspace disease unchanged. Negative for pneumothorax. Mild left lower lobe atelectasis. No left effusion. Pigtail drainage catheter right upper quadrant IMPRESSION: Negative for pneumothorax post right thoracentesis. Moderate right pleural effusion remains Bibasilar airspace disease right greater than left. Electronically Signed   By: Marlan Palau M.D.   On: 12/23/2016 12:53   Ir Guided Horace Porteous W Catheter Placement  Result Date: 12/23/2016 CLINICAL DATA:  History of bile leak following cholecystectomy, post percutaneous drainage catheter placement by Dr. Bonnielee Haff on 12/08/2016. Subsequent abdominal CT performed 12/22/2016 demonstrates resolution of the fluid around the percutaneous drainage catheter however residual fluid is seen about the level of the gallbladder fossa. As such, fluoroscopic guided drainage catheter exchange and repositioning has been requested. EXAM: ABSCESS  DRAINAGE CATHETER EXCHANGE COMPARISON:  CT abdomen and pelvis - 12/22/2016; 12/20/2016; 12/07/2016; CT-guided percutaneous drainage catheter placement -12/08/2016 CONTRAST:  10 cc Isovue 300, administered via the existing percutaneous drainage catheter MEDICATIONS: None. The patient is currently admitted to the hospital and receiving intravenous antibiotics. Antibiotics were administered within an appropriate time frame prior to the initiation of the procedure. ANESTHESIA/SEDATION: None FLUOROSCOPY TIME:  1 minute 18 seconds (16 mGy) FINDINGS: With the patient positioned left lateral decubitus on the fluoroscopy table, the external portion of the existing right upper quadrant percutaneous drainage catheter nodule with surrounding skin was prepped and draped in usual sterile fashion. A preprocedural spot fluoroscopic image was obtained of the right upper abdominal quadrant existing percutaneous drainage catheter. A small amount of contrast was injected via the existing percutaneous drainage catheter and several fluoroscopic images were obtained in various obliquities. The external portion of the percutaneous drainage catheter was cut and cannulated with a short Amplatz wire. Under intermittent fluoroscopic guidance, the existing percutaneous drainage catheter was exchanged for a new 12 French percutaneous drainage catheter which was repositioned more caudally at the level of the gallbladder fossa. Contrast injection confirmed appropriate positioning and functionality of the drainage catheter. The percutaneous drainage catheter was secured in place within interrupted suture and a StatLock device. The drain was connected to a gravity bag. A dressing was placed. The patient tolerated the procedure well without immediate postprocedural complication. IMPRESSION: Successful fluoroscopic guided repositioning of right upper quadrant abdominal percutaneous drainage catheter, now positioned at the level the gallbladder fossa.  Repositioning yielded return of bilious appearing fluid. Electronically Signed   By: Simonne Come M.D.   On: 12/23/2016 15:36   Ir Thoracentesis Asp Pleural Space W/img Guide  Result Date: 12/23/2016 INDICATION: History of cholecystectomy complicated by development of bile leak, now with enlarging symptomatic right-sided pleural effusion. Please from ultrasound-guided thoracentesis for therapeutic and diagnostic purposes. EXAM: IR THORACENTESIS ASP PLEURAL SPACE W/IMG GUIDE COMPARISON:  Chest CT - 12/23/2026 MEDICATIONS: None. COMPLICATIONS: None immediate. TECHNIQUE: Informed written consent was obtained from the patient after a discussion of the risks, benefits and alternatives to treatment. A timeout was performed prior to the initiation of the procedure. Initial ultrasound scanning demonstrates a small to moderate-sized right-sided pleural effusion. The lower chest was prepped and draped in the usual sterile fashion. 1% lidocaine was used for local anesthesia. An ultrasound image was saved for documentation purposes. An 8 Fr Safe-T-Centesis catheter was introduced. The thoracentesis was performed. Note, despite there being a small amount of residual fluid, the procedure was  terminated early due to patient's chest discomfort. The catheter was removed and a dressing was applied. The patient tolerated the procedure well without immediate post procedural complication. The patient was escorted to have an upright chest radiograph. FINDINGS: A total of approximately 850 cc cc of serous fluid was removed. Requested samples were sent to the laboratory. IMPRESSION: Successful ultrasound-guided right sided thoracentesis yielding 800 cc of pleural fluid. Electronically Signed   By: Simonne Come M.D.   On: 12/23/2016 15:30        Scheduled Meds: . acetaminophen  1,000 mg Oral Q8H  . docusate sodium  100 mg Oral BID  . heparin subcutaneous  5,000 Units Subcutaneous Q8H  . metoprolol succinate  25 mg Oral Q  breakfast  . multivitamins with iron  1 tablet Oral QPC supper  . pantoprazole  40 mg Oral Daily  . sodium chloride flush  5 mL Intravenous Q8H   Continuous Infusions: . sodium chloride 75 mL/hr at 12/24/16 1556  . piperacillin-tazobactam (ZOSYN)  IV Stopped (12/24/16 1556)  . vancomycin       LOS: 21 days    Time spent: 85 Minutes    Berton Mount, MD  [facility] Pager #: [phone] 7PM-7AM contact night coverage as above

## 2016-12-25 ENCOUNTER — Inpatient Hospital Stay (HOSPITAL_COMMUNITY): Payer: No Typology Code available for payment source

## 2016-12-25 LAB — COMPREHENSIVE METABOLIC PANEL
ALK PHOS: 114 U/L (ref 38–126)
ALT: 14 U/L — AB (ref 17–63)
ANION GAP: 7 (ref 5–15)
AST: 32 U/L (ref 15–41)
Albumin: 2.1 g/dL — ABNORMAL LOW (ref 3.5–5.0)
BUN: 5 mg/dL — ABNORMAL LOW (ref 6–20)
CALCIUM: 8.2 mg/dL — AB (ref 8.9–10.3)
CO2: 25 mmol/L (ref 22–32)
CREATININE: 0.75 mg/dL (ref 0.61–1.24)
Chloride: 103 mmol/L (ref 101–111)
Glucose, Bld: 95 mg/dL (ref 65–99)
Potassium: 4 mmol/L (ref 3.5–5.1)
SODIUM: 135 mmol/L (ref 135–145)
Total Bilirubin: 1 mg/dL (ref 0.3–1.2)
Total Protein: 6.4 g/dL — ABNORMAL LOW (ref 6.5–8.1)

## 2016-12-25 LAB — CBC
HCT: 32.2 % — ABNORMAL LOW (ref 39.0–52.0)
HEMOGLOBIN: 10 g/dL — AB (ref 13.0–17.0)
MCH: 27.7 pg (ref 26.0–34.0)
MCHC: 31.1 g/dL (ref 30.0–36.0)
MCV: 89.2 fL (ref 78.0–100.0)
PLATELETS: 544 10*3/uL — AB (ref 150–400)
RBC: 3.61 MIL/uL — AB (ref 4.22–5.81)
RDW: 15.3 % (ref 11.5–15.5)
WBC: 8.6 10*3/uL (ref 4.0–10.5)

## 2016-12-25 MED ORDER — OXYCODONE HCL 5 MG PO TABS: 5.0000 mg | ORAL_TABLET | ORAL | 0 refills | Status: DC | PRN

## 2016-12-25 MED ORDER — AMOXICILLIN-POT CLAVULANATE 875-125 MG PO TABS: 1.0000 | ORAL_TABLET | Freq: Two times a day (BID) | ORAL | 0 refills | Status: AC

## 2016-12-25 NOTE — Progress Notes (Signed)
Discharge paperwork reviewed with patient. Patient is ready for discharge. Prescription given.

## 2016-12-25 NOTE — Care Management Note (Signed)
Case Management Note  Patient Details  Name: Bobby Kelly MRN: [id number] Date of Birth: 1980-03-21  Subjective/Objective:                 Patient with order to DC to home today. Chart reviewed. No Home Health or Equipment needs, no unacknowledged Case Management consults or medication needs identified at the time of this note. Plan for DC to home.  CM signing off. If new needs arise today prior to discharge, please call Lawerance Sabal RN CM at [phone].   Action/Plan:   Expected Discharge Date:  12/25/16               Expected Discharge Plan:  Home/Self Care  In-House Referral:     Discharge planning Services  CM Consult  Post Acute Care Choice:    Choice offered to:     DME Arranged:    DME Agency:     HH Arranged:    HH Agency:     Status of Service:  Completed, signed off  If discussed at [employer] of Stay Meetings, dates discussed:    Additional Comments:  Lawerance Sabal, RN 12/25/2016, 12:00 PM

## 2016-12-25 NOTE — Progress Notes (Signed)
PROGRESS NOTE    Bobby Kelly  ZOX:[id number] DOB: 08/29/1980 DOA: 12/02/2016 PCP: Jac Canavan, PA-C  Outpatient Specialists:     Brief Narrative:  Patient is a 36 year old Male, admitted 2 weeks ago with gangrenous cholecystitis status post cholecystectomy who developed a bile leak and underwent ERCP with drains and stents. In addition, interventional radiology placed perihepatic drain for abscess prior to that. Hospitalist was consulted as patient developed intermittent fevers hypertension leukocytosis and tachycardia. Patient underwent left sided thoracentesis today. Pleural fluid analysis result is not finalized. CT Chest done on 12/22/16 revealed "moderate to large dependent right pleural effusion, increased since 12/20/2016 CT abdomen study, complete right lower lobe and mild-to-moderate right middle lobe, right upper lobe and left lower lobe atelectasis, partially visualized right perihepatic collection appeared mildly increased in size since 12/20/2016 CT abdomen study. Stable position of right upper quadrant percutaneous pigtail drain within the ventral portion of this collection". Patient is status post right sided thoracentesis. Patient feels better and denied any constitutional symptoms. No fever or chills. No SOB and no pleuritic chest pain. Patient is on Vanc and Zosyn. Repeat CXR in am.   Assessment & Plan:   Principal Problem:   HCAP (healthcare-associated pneumonia) Active Problems:   Acute calculous cholecystitis   Pleural effusion   CHF (congestive heart failure) (HCC)   Cholelithiasis  CT Scan revealed effusion and compressive atelectasis. Patient is status post thoracentesis. Follow fluid analysis. Repeat CXR in am. Continue work up. Likely exudative effusion. Continue antibiotics Continue to monitor vitals, and other constitutional symptos Incentive spirometry - Will defer management of perihepatic collection to the Primary team.   DVT prophylaxis: [place]kov  Heparin Code Status: Full Family Communication: Wife Disposition Plan: Will depend on hospital course   Consultants:     Procedures:   Right sided thoracentesis  Antimicrobials:   Vanc  Zosyn    Subjective: Continues to feel better.  Objective: Vitals:   12/24/16 1505 12/24/16 2028 12/25/16 0500 12/25/16 0647  BP: 119/70 123/73  134/82  Pulse: (!) 108 (!) 108  (!) 104  Resp: 18 18  18   Temp: 98 F (36.7 C) 98.1 F (36.7 C)  99.2 F (37.3 C)  TempSrc: Oral Oral    SpO2: 96% 96%  97%  Weight:   87 kg (191 lb 12.8 oz)   Height:        Intake/Output Summary (Last 24 hours) at 12/25/2016 1013 Last data filed at 12/25/2016 0740 Gross per 24 hour  Intake 2945 ml  Output 3800 ml  Net -855 ml   Filed Weights   12/23/16 0500 12/24/16 0600 12/25/16 0500  Weight: 91.6 kg (201 lb 15.1 oz) 87.2 kg (192 lb 3.2 oz) 87 kg (191 lb 12.8 oz)    Examination:  General exam: Appears calm and comfortable HEENT - Pallor  Respiratory system: Decreased air entry right lung field. Cardiovascular system: S1 & S2. Gastrointestinal system: Abdomen has drains (right abdominal area). Central nervous system: Alert and oriented. No focal neurological deficits. Extremities: No leg edema. Psychiatry: Judgement and insight appear normal. Mood & affect appropriate.     Data Reviewed: I have personally reviewed following labs and imaging studies  CBC: Recent Labs  Lab 12/20/16 0325 12/21/16 0500 12/22/16 0411 12/23/16 0214 12/24/16 0402 12/25/16 0852  WBC 11.0* 13.4* 13.8* 13.5* 10.3 8.6  NEUTROABS 7.2  --   --   --   --   --   HGB 9.2* 10.6* 10.0* 9.3* 8.8* 10.0*  HCT  29.9* 34.2* 32.9* 30.6* 29.2* 32.2*  MCV 92.0 91.4 91.4 90.5 90.1 89.2  PLT 657* 585* 519* 451* 494* 544*   Basic Metabolic Panel: Recent Labs  Lab 12/21/16 0500 12/21/16 1841 12/22/16 0411 12/23/16 0214 12/24/16 0402  NA 134* 131* 133* 130* 133*  K 4.0 3.8 3.8 3.8 3.6  CL 103 100* 100* 99* 100*    CO2 25 23 26 24 26   GLUCOSE 93 138* 118* 112* 97  BUN 5* 6 6 9 6   CREATININE 0.84 0.93 0.90 0.84 0.80  CALCIUM 7.9* 8.1* 7.9* 7.4* 7.5*   GFR: Estimated Creatinine Clearance: 131.9 mL/min (by C-G formula based on SCr of 0.8 mg/dL). Liver Function Tests: Recent Labs  Lab 12/20/16 0325 12/21/16 0500 12/21/16 1841 12/24/16 0402  AST 19 20 19 15   ALT 16* 16* 16* 12*  ALKPHOS 102 109 120 108  BILITOT 0.8 0.8 0.7 0.5  PROT 5.5* 5.8* 6.4* 5.5*  ALBUMIN 2.2* 2.2* 2.3* 1.9*   Recent Labs  Lab 12/20/16 0325 12/21/16 1841  LIPASE 30 17   No results for input(s): AMMONIA in the last 168 hours. Coagulation Profile: Recent Labs  Lab 12/23/16 0214  INR 1.19   Cardiac Enzymes: No results for input(s): CKTOTAL, CKMB, CKMBINDEX, TROPONINI in the last 168 hours. BNP (last 3 results) No results for input(s): PROBNP in the last 8760 hours. HbA1C: No results for input(s): HGBA1C in the last 72 hours. CBG: No results for input(s): GLUCAP in the last 168 hours. Lipid Profile: No results for input(s): CHOL, HDL, LDLCALC, TRIG, CHOLHDL, LDLDIRECT in the last 72 hours. Thyroid Function Tests: No results for input(s): TSH, T4TOTAL, FREET4, T3FREE, THYROIDAB in the last 72 hours. Anemia Panel: No results for input(s): VITAMINB12, FOLATE, FERRITIN, TIBC, IRON, RETICCTPCT in the last 72 hours. Urine analysis:    Component Value Date/Time   COLORURINE AMBER (A) 12/22/2016 1030   APPEARANCEUR HAZY (A) 12/22/2016 1030   LABSPEC 1.043 (H) 12/22/2016 1030   LABSPEC 1.020 11/03/2016 0827   PHURINE 5.0 12/22/2016 1030   GLUCOSEU NEGATIVE 12/22/2016 1030   HGBUR NEGATIVE 12/22/2016 1030   BILIRUBINUR SMALL (A) 12/22/2016 1030   BILIRUBINUR negative 11/03/2016 0827   BILIRUBINUR neg 04/28/2016 1146   KETONESUR NEGATIVE 12/22/2016 1030   PROTEINUR 100 (A) 12/22/2016 1030   UROBILINOGEN negative 04/28/2016 1146   NITRITE NEGATIVE 12/22/2016 1030   LEUKOCYTESUR NEGATIVE 12/22/2016 1030    Sepsis Labs: @LABRCNTIP (procalcitonin:4,lacticidven:4)  ) Recent Results (from the past 240 hour(s))  Culture, blood (Routine X 2) w Reflex to ID Panel     Status: None (Preliminary result)   Collection Time: 12/22/16  8:43 AM  Result Value Ref Range Status   Specimen Description BLOOD RIGHT ANTECUBITAL  Final   Special Requests   Final    BOTTLES DRAWN AEROBIC AND ANAEROBIC Blood Culture adequate volume   Culture NO GROWTH 2 DAYS  Final   Report Status PENDING  Incomplete  Culture, blood (Routine X 2) w Reflex to ID Panel     Status: None (Preliminary result)   Collection Time: 12/22/16  9:30 AM  Result Value Ref Range Status   Specimen Description BLOOD CENTRAL LINE MIDLINE  Final   Special Requests   Final    BOTTLES DRAWN AEROBIC AND ANAEROBIC Blood Culture adequate volume   Culture NO GROWTH 2 DAYS  Final   Report Status PENDING  Incomplete  Urine Culture     Status: None   Collection Time: 12/22/16 10:30 AM  Result Value  Ref Range Status   Specimen Description URINE, RANDOM  Final   Special Requests NONE  Final   Culture NO GROWTH  Final   Report Status 12/23/2016 FINAL  Final  Gram stain     Status: None   Collection Time: 12/23/16 12:28 PM  Result Value Ref Range Status   Specimen Description FLUID RIGHT PLEURAL  Final   Special Requests NONE  Final   Gram Stain   Final    MODERATE WBC PRESENT,BOTH PMN AND MONONUCLEAR NO ORGANISMS SEEN    Report Status 12/23/2016 FINAL  Final         Radiology Studies: Dg Chest 1 View  Result Date: 12/23/2016 CLINICAL DATA:  Short of breath.  Post right thoracentesis EXAM: CHEST 1 VIEW COMPARISON:  12/21/2016 FINDINGS: Moderate right pleural effusion unchanged. Extensive right lower lobe airspace disease unchanged. Negative for pneumothorax. Mild left lower lobe atelectasis. No left effusion. Pigtail drainage catheter right upper quadrant IMPRESSION: Negative for pneumothorax post right thoracentesis. Moderate right pleural  effusion remains Bibasilar airspace disease right greater than left. Electronically Signed   By: Marlan Palau M.D.   On: 12/23/2016 12:53   Ir Guided Horace Porteous W Catheter Placement  Result Date: 12/23/2016 CLINICAL DATA:  History of bile leak following cholecystectomy, post percutaneous drainage catheter placement by Dr. Bonnielee Haff on 12/08/2016. Subsequent abdominal CT performed 12/22/2016 demonstrates resolution of the fluid around the percutaneous drainage catheter however residual fluid is seen about the level of the gallbladder fossa. As such, fluoroscopic guided drainage catheter exchange and repositioning has been requested. EXAM: ABSCESS DRAINAGE CATHETER EXCHANGE COMPARISON:  CT abdomen and pelvis - 12/22/2016; 12/20/2016; 12/07/2016; CT-guided percutaneous drainage catheter placement -12/08/2016 CONTRAST:  10 cc Isovue 300, administered via the existing percutaneous drainage catheter MEDICATIONS: None. The patient is currently admitted to the hospital and receiving intravenous antibiotics. Antibiotics were administered within an appropriate time frame prior to the initiation of the procedure. ANESTHESIA/SEDATION: None FLUOROSCOPY TIME:  1 minute 18 seconds (16 mGy) FINDINGS: With the patient positioned left lateral decubitus on the fluoroscopy table, the external portion of the existing right upper quadrant percutaneous drainage catheter nodule with surrounding skin was prepped and draped in usual sterile fashion. A preprocedural spot fluoroscopic image was obtained of the right upper abdominal quadrant existing percutaneous drainage catheter. A small amount of contrast was injected via the existing percutaneous drainage catheter and several fluoroscopic images were obtained in various obliquities. The external portion of the percutaneous drainage catheter was cut and cannulated with a short Amplatz wire. Under intermittent fluoroscopic guidance, the existing percutaneous drainage catheter was exchanged for a  new 12 French percutaneous drainage catheter which was repositioned more caudally at the level of the gallbladder fossa. Contrast injection confirmed appropriate positioning and functionality of the drainage catheter. The percutaneous drainage catheter was secured in place within interrupted suture and a StatLock device. The drain was connected to a gravity bag. A dressing was placed. The patient tolerated the procedure well without immediate postprocedural complication. IMPRESSION: Successful fluoroscopic guided repositioning of right upper quadrant abdominal percutaneous drainage catheter, now positioned at the level the gallbladder fossa. Repositioning yielded return of bilious appearing fluid. Electronically Signed   By: Simonne Come M.D.   On: 12/23/2016 15:36   Ir Thoracentesis Asp Pleural Space W/img Guide  Result Date: 12/23/2016 INDICATION: History of cholecystectomy complicated by development of bile leak, now with enlarging symptomatic right-sided pleural effusion. Please from ultrasound-guided thoracentesis for therapeutic and diagnostic purposes. EXAM: IR THORACENTESIS ASP PLEURAL  SPACE W/IMG GUIDE COMPARISON:  Chest CT - 12/23/2026 MEDICATIONS: None. COMPLICATIONS: None immediate. TECHNIQUE: Informed written consent was obtained from the patient after a discussion of the risks, benefits and alternatives to treatment. A timeout was performed prior to the initiation of the procedure. Initial ultrasound scanning demonstrates a small to moderate-sized right-sided pleural effusion. The lower chest was prepped and draped in the usual sterile fashion. 1% lidocaine was used for local anesthesia. An ultrasound image was saved for documentation purposes. An 8 Fr Safe-T-Centesis catheter was introduced. The thoracentesis was performed. Note, despite there being a small amount of residual fluid, the procedure was terminated early due to patient's chest discomfort. The catheter was removed and a dressing was  applied. The patient tolerated the procedure well without immediate post procedural complication. The patient was escorted to have an upright chest radiograph. FINDINGS: A total of approximately 850 cc cc of serous fluid was removed. Requested samples were sent to the laboratory. IMPRESSION: Successful ultrasound-guided right sided thoracentesis yielding 800 cc of pleural fluid. Electronically Signed   By: Simonne Come M.D.   On: 12/23/2016 15:30        Scheduled Meds: . acetaminophen  1,000 mg Oral Q8H  . docusate sodium  100 mg Oral BID  . heparin subcutaneous  5,000 Units Subcutaneous Q8H  . metoprolol succinate  25 mg Oral Q breakfast  . multivitamins with iron  1 tablet Oral QPC supper  . pantoprazole  40 mg Oral Daily  . sodium chloride flush  5 mL Intravenous Q8H   Continuous Infusions: . sodium chloride 75 mL/hr at 12/24/16 1556  . piperacillin-tazobactam (ZOSYN)  IV Stopped (12/25/16 0724)  . vancomycin Stopped (12/25/16 0356)     LOS: 22 days    Time spent: 1 Minutes    Berton Mount, MD  [facility] Pager #: [phone] 7PM-7AM contact night coverage as above

## 2016-12-25 NOTE — Progress Notes (Signed)
[facility] Progress Note  6 Days Post-Op  Subjective: CC: abdominal pain Patient has some mild abdominal pain in mid to right abdomen, dull achy feeling. Pain really only present with certain movements. Breathing is improving, pulling 800 on IS. Overall patient feels like he is improving and would like to get home.  UOP good. VSS.   Objective: Vital signs in last 24 hours: Temp:  [98 F (36.7 C)-99.2 F (37.3 C)] 99.2 F (37.3 C) (11/18 0647) Pulse Rate:  [104-108] 104 (11/18 0647) Resp:  [18] 18 (11/18 0647) BP: (117-134)/(68-82) 134/82 (11/18 0647) SpO2:  [95 %-97 %] 97 % (11/18 0647) Weight:  [87 kg (191 lb 12.8 oz)] 87 kg (191 lb 12.8 oz) (11/18 0500) Last BM Date: 12/24/16  Intake/Output from previous day: 11/17 0701 - 11/18 0700 In: 2950 [P.O.:240; I.V.:1650; IV Piggyback:1050] Out: 3900 [Urine:3350; Drains:200; Stool:350] Intake/Output this shift: No intake/output data recorded.  PE: Gen:  Alert, NAD, pleasant Card:  Regular rate and rhythm, pedal pulses 2+ BL Pulm:  Normal effort, clear to auscultation bilaterally Abd: Soft, non-tender, non-distended, bowel sounds present in all 4 quadrants, no HSM, incisions C/D/I Skin: warm and dry, no rashes  Psych: A&Ox3   Lab Results:  Recent Labs    12/23/16 0214 12/24/16 0402  WBC 13.5* 10.3  HGB 9.3* 8.8*  HCT 30.6* 29.2*  PLT 451* 494*   BMET Recent Labs    12/23/16 0214 12/24/16 0402  NA 130* 133*  K 3.8 3.6  CL 99* 100*  CO2 24 26  GLUCOSE 112* 97  BUN 9 6  CREATININE 0.84 0.80  CALCIUM 7.4* 7.5*   PT/INR Recent Labs    12/23/16 0214  LABPROT 15.0  INR 1.19   CMP     Component Value Date/Time   NA 133 (L) 12/24/2016 0402   NA 137 11/01/2016 0832   K 3.6 12/24/2016 0402   CL 100 (L) 12/24/2016 0402   CO2 26 12/24/2016 0402   GLUCOSE 97 12/24/2016 0402   BUN 6 12/24/2016 0402   BUN 17 11/01/2016 0832   CREATININE 0.80 12/24/2016 0402   CREATININE 0.83 04/28/2016 1232    CALCIUM 7.5 (L) 12/24/2016 0402   PROT 5.5 (L) 12/24/2016 0402   ALBUMIN 1.9 (L) 12/24/2016 0402   AST 15 12/24/2016 0402   ALT 12 (L) 12/24/2016 0402   ALKPHOS 108 12/24/2016 0402   BILITOT 0.5 12/24/2016 0402   GFRNONAA >60 12/24/2016 0402   GFRAA >60 12/24/2016 0402   Lipase     Component Value Date/Time   LIPASE 17 12/21/2016 1841       Studies/Results: Dg Chest 1 View  Result Date: 12/23/2016 CLINICAL DATA:  Short of breath.  Post right thoracentesis EXAM: CHEST 1 VIEW COMPARISON:  12/21/2016 FINDINGS: Moderate right pleural effusion unchanged. Extensive right lower lobe airspace disease unchanged. Negative for pneumothorax. Mild left lower lobe atelectasis. No left effusion. Pigtail drainage catheter right upper quadrant IMPRESSION: Negative for pneumothorax post right thoracentesis. Moderate right pleural effusion remains Bibasilar airspace disease right greater than left. Electronically Signed   By: Marlan Palau M.D.   On: 12/23/2016 12:53   Ir Guided Horace Porteous W Catheter Placement  Result Date: 12/23/2016 CLINICAL DATA:  History of bile leak following cholecystectomy, post percutaneous drainage catheter placement by Dr. Bonnielee Haff on 12/08/2016. Subsequent abdominal CT performed 12/22/2016 demonstrates resolution of the fluid around the percutaneous drainage catheter however residual fluid is seen about the level of the gallbladder fossa. As such, fluoroscopic  guided drainage catheter exchange and repositioning has been requested. EXAM: ABSCESS DRAINAGE CATHETER EXCHANGE COMPARISON:  CT abdomen and pelvis - 12/22/2016; 12/20/2016; 12/07/2016; CT-guided percutaneous drainage catheter placement -12/08/2016 CONTRAST:  10 cc Isovue 300, administered via the existing percutaneous drainage catheter MEDICATIONS: None. The patient is currently admitted to the hospital and receiving intravenous antibiotics. Antibiotics were administered within an appropriate time frame prior to the initiation of  the procedure. ANESTHESIA/SEDATION: None FLUOROSCOPY TIME:  1 minute 18 seconds (16 mGy) FINDINGS: With the patient positioned left lateral decubitus on the fluoroscopy table, the external portion of the existing right upper quadrant percutaneous drainage catheter nodule with surrounding skin was prepped and draped in usual sterile fashion. A preprocedural spot fluoroscopic image was obtained of the right upper abdominal quadrant existing percutaneous drainage catheter. A small amount of contrast was injected via the existing percutaneous drainage catheter and several fluoroscopic images were obtained in various obliquities. The external portion of the percutaneous drainage catheter was cut and cannulated with a short Amplatz wire. Under intermittent fluoroscopic guidance, the existing percutaneous drainage catheter was exchanged for a new 12 French percutaneous drainage catheter which was repositioned more caudally at the level of the gallbladder fossa. Contrast injection confirmed appropriate positioning and functionality of the drainage catheter. The percutaneous drainage catheter was secured in place within interrupted suture and a StatLock device. The drain was connected to a gravity bag. A dressing was placed. The patient tolerated the procedure well without immediate postprocedural complication. IMPRESSION: Successful fluoroscopic guided repositioning of right upper quadrant abdominal percutaneous drainage catheter, now positioned at the level the gallbladder fossa. Repositioning yielded return of bilious appearing fluid. Electronically Signed   By: Simonne ComeJohn  Watts M.D.   On: 12/23/2016 15:36   Ir Thoracentesis Asp Pleural Space W/img Guide  Result Date: 12/23/2016 INDICATION: History of cholecystectomy complicated by development of bile leak, now with enlarging symptomatic right-sided pleural effusion. Please from ultrasound-guided thoracentesis for therapeutic and diagnostic purposes. EXAM: IR  THORACENTESIS ASP PLEURAL SPACE W/IMG GUIDE COMPARISON:  Chest CT - 12/23/2026 MEDICATIONS: None. COMPLICATIONS: None immediate. TECHNIQUE: Informed written consent was obtained from the patient after a discussion of the risks, benefits and alternatives to treatment. A timeout was performed prior to the initiation of the procedure. Initial ultrasound scanning demonstrates a small to moderate-sized right-sided pleural effusion. The lower chest was prepped and draped in the usual sterile fashion. 1% lidocaine was used for local anesthesia. An ultrasound image was saved for documentation purposes. An 8 Fr Safe-T-Centesis catheter was introduced. The thoracentesis was performed. Note, despite there being a small amount of residual fluid, the procedure was terminated early due to patient's chest discomfort. The catheter was removed and a dressing was applied. The patient tolerated the procedure well without immediate post procedural complication. The patient was escorted to have an upright chest radiograph. FINDINGS: A total of approximately 850 cc cc of serous fluid was removed. Requested samples were sent to the laboratory. IMPRESSION: Successful ultrasound-guided right sided thoracentesis yielding 800 cc of pleural fluid. Electronically Signed   By: Simonne ComeJohn  Watts M.D.   On: 12/23/2016 15:30    Anti-infectives: Anti-infectives (From admission, onward)   Start     Dose/Rate Route Frequency Ordered Stop   12/24/16 1800  vancomycin (VANCOCIN) 1,500 mg in sodium chloride 0.9 % 500 mL IVPB     1,500 mg 250 mL/hr over 120 Minutes Intravenous Every 8 hours 12/24/16 1321     12/21/16 1930  piperacillin-tazobactam (ZOSYN) IVPB 3.375 g  3.375 g 12.5 mL/hr over 240 Minutes Intravenous Every 8 hours 12/21/16 1900     12/21/16 1930  vancomycin (VANCOCIN) IVPB 1000 mg/200 mL premix  Status:  Discontinued     1,000 mg 200 mL/hr over 60 Minutes Intravenous Every 8 hours 12/21/16 1900 12/24/16 1321   12/21/16 1015   amoxicillin-clavulanate (AUGMENTIN) 875-125 MG per tablet 1 tablet  Status:  Discontinued     1 tablet Oral Every 12 hours 12/21/16 1012 12/21/16 1854   12/07/16 0830  piperacillin-tazobactam (ZOSYN) IVPB 3.375 g     3.375 g 12.5 mL/hr over 240 Minutes Intravenous Every 8 hours 12/07/16 0736 12/20/16 2359   12/02/16 1500  cefTRIAXone (ROCEPHIN) 2 g in dextrose 5 % 50 mL IVPB  Status:  Discontinued     2 g 100 mL/hr over 30 Minutes Intravenous Every 24 hours 12/02/16 1337 12/07/16 0736       Assessment/Plan Hypertension - Toprol -25mg  QD Hx of Borderline CHF - Grade 2 diastolic dysfunction/EF 44% Hyperlipidemia History of thyroid disease  Gangrenous cholecystitis with cholelithiasis  Laparoscopic cholecystectomy, 19 French Blake drain placement, gangrenous changes involving the entirety of the back wall, short cystic duct precluded cholangiogram10/27/18, Dr. Violeta Gelinas CT 10/31: 19.5 cm fluid collection - Biloma/abscess 17.4 cm possible hematoma IR drain Placed 12/08/16, 1050 In drain on return from the floor and 2035 ml total yesterday.  Drain 360/24h ERCP attempt 11/6 Dr. Dulce Sellar - unable to do (food in stomach) ERCP attempt 11/8 Dr. Dulce Sellar - unable to do (throat edema) ERCP 10Fr 7 cm drain placed, sphincterotomy 12/19/16, Dr. Ewing Schlein Fever and increasing leukocytosis - TM 102.5 PM 12/21/16 Plan drain repositioning by IR 12/23/16 RLL pleural effusion/atelectasis - IS CXR PM 11/14:Worsening atelectasis or infiltrate at the left base Small moderate right pleural effusion Cardiomegaly with vascular congestion Vancomycin and Zosyn restarted PM 11/14 Healthcare associated pneumonia -cultures pending -strep pneumoniae urinary antigen negative CT of the chest 12/22/16: Moderate to large dependent right pleural effusion; complete right lower lobe middle lobe and left lower lobe atelectasis.  Increased right perihepatic collection, compared to 12/20/2016. IR thoracentesis and  drain repositioning 11/16 Acute kidney injury: resolved Anemia -stable Hyponatremia -133 yesterday, improving - normal saline IV fluid restarted 12/23/16  LID:CVUD diet ID: Rocephin 10/26 -10/31. Zosyn 10/31- 11/13; recurrent fever =>> 11/14 Vancomycin/Zosyn DVT: SCD's, SQ hep Follow up: Dr Janee Morn  Plan: recheck labs. Possibly home later today vs. Tomorrow.     LOS: 22 days    Wells Guiles , [facility] 12/25/2016, 8:32 AM Pager: [phone] Consults: [phone] Mon-Fri 7:00 am-4:30 pm Sat-Sun 7:00 am-11:30 am

## 2016-12-26 ENCOUNTER — Other Ambulatory Visit: Payer: Self-pay | Admitting: General Surgery

## 2016-12-26 ENCOUNTER — Encounter (HOSPITAL_COMMUNITY): Payer: Self-pay

## 2016-12-26 DIAGNOSIS — K668 Other specified disorders of peritoneum: Secondary | ICD-10-CM | POA: Diagnosis not present

## 2016-12-26 DIAGNOSIS — K838 Other specified diseases of biliary tract: Secondary | ICD-10-CM

## 2016-12-26 DIAGNOSIS — K9189 Other postprocedural complications and disorders of digestive system: Principal | ICD-10-CM

## 2016-12-26 DIAGNOSIS — T8189XA Other complications of procedures, not elsewhere classified, initial encounter: Secondary | ICD-10-CM | POA: Diagnosis not present

## 2016-12-26 LAB — PH, BODY FLUID: pH, Body Fluid: 8.4

## 2016-12-27 LAB — CULTURE, BLOOD (ROUTINE X 2)
CULTURE: NO GROWTH
Culture: NO GROWTH
Special Requests: ADEQUATE
Special Requests: ADEQUATE

## 2016-12-28 LAB — CHOLESTEROL, BODY FLUID: CHOL FL: 68 mg/dL

## 2016-12-30 ENCOUNTER — Emergency Department (HOSPITAL_COMMUNITY): Payer: No Typology Code available for payment source

## 2016-12-30 ENCOUNTER — Other Ambulatory Visit: Payer: Self-pay

## 2016-12-30 ENCOUNTER — Emergency Department (HOSPITAL_COMMUNITY)
Admission: EM | Admit: 2016-12-30 | Discharge: 2016-12-30 | Disposition: A | Discharge status: Home / Self Care | Payer: No Typology Code available for payment source | Attending: Emergency Medicine | Admitting: Emergency Medicine

## 2016-12-30 ENCOUNTER — Encounter (HOSPITAL_COMMUNITY): Payer: Self-pay | Admitting: Emergency Medicine

## 2016-12-30 DIAGNOSIS — R0602 Shortness of breath: Secondary | ICD-10-CM | POA: Diagnosis not present

## 2016-12-30 DIAGNOSIS — Z79899 Other long term (current) drug therapy: Secondary | ICD-10-CM | POA: Diagnosis not present

## 2016-12-30 DIAGNOSIS — I110 Hypertensive heart disease with heart failure: Secondary | ICD-10-CM | POA: Diagnosis not present

## 2016-12-30 DIAGNOSIS — R0789 Other chest pain: Secondary | ICD-10-CM | POA: Diagnosis not present

## 2016-12-30 DIAGNOSIS — I509 Heart failure, unspecified: Secondary | ICD-10-CM | POA: Insufficient documentation

## 2016-12-30 LAB — COMPREHENSIVE METABOLIC PANEL
ALBUMIN: 2.6 g/dL — AB (ref 3.5–5.0)
ALT: 93 U/L — ABNORMAL HIGH (ref 17–63)
ANION GAP: 9 (ref 5–15)
AST: 60 U/L — ABNORMAL HIGH (ref 15–41)
Alkaline Phosphatase: 214 U/L — ABNORMAL HIGH (ref 38–126)
BILIRUBIN TOTAL: 0.6 mg/dL (ref 0.3–1.2)
BUN: 13 mg/dL (ref 6–20)
CO2: 26 mmol/L (ref 22–32)
Calcium: 9.2 mg/dL (ref 8.9–10.3)
Chloride: 102 mmol/L (ref 101–111)
Creatinine, Ser: 0.86 mg/dL (ref 0.61–1.24)
GLUCOSE: 112 mg/dL — AB (ref 65–99)
POTASSIUM: 3.7 mmol/L (ref 3.5–5.1)
Sodium: 137 mmol/L (ref 135–145)
TOTAL PROTEIN: 8.2 g/dL — AB (ref 6.5–8.1)

## 2016-12-30 LAB — CBC WITH DIFFERENTIAL/PLATELET
BASOS ABS: 0.1 10*3/uL (ref 0.0–0.1)
Basophils Relative: 1 %
Eosinophils Absolute: 0.2 10*3/uL (ref 0.0–0.7)
Eosinophils Relative: 2 %
HEMATOCRIT: 37.8 % — AB (ref 39.0–52.0)
Hemoglobin: 11.7 g/dL — ABNORMAL LOW (ref 13.0–17.0)
LYMPHS PCT: 20 %
Lymphs Abs: 2.1 10*3/uL (ref 0.7–4.0)
MCH: 27.2 pg (ref 26.0–34.0)
MCHC: 31 g/dL (ref 30.0–36.0)
MCV: 87.9 fL (ref 78.0–100.0)
Monocytes Absolute: 0.7 10*3/uL (ref 0.1–1.0)
Monocytes Relative: 6 %
NEUTROS ABS: 7.5 10*3/uL (ref 1.7–7.7)
NEUTROS PCT: 71 %
Platelets: 851 10*3/uL — ABNORMAL HIGH (ref 150–400)
RBC: 4.3 MIL/uL (ref 4.22–5.81)
RDW: 15.8 % — ABNORMAL HIGH (ref 11.5–15.5)
WBC: 10.5 10*3/uL (ref 4.0–10.5)

## 2016-12-30 LAB — I-STAT CG4 LACTIC ACID, ED
LACTIC ACID, VENOUS: 2.03 mmol/L — AB (ref 0.5–1.9)
Lactic Acid, Venous: 0.81 mmol/L (ref 0.5–1.9)

## 2016-12-30 LAB — URINALYSIS, ROUTINE W REFLEX MICROSCOPIC
Bilirubin Urine: NEGATIVE
GLUCOSE, UA: NEGATIVE mg/dL
Hgb urine dipstick: NEGATIVE
Ketones, ur: NEGATIVE mg/dL
LEUKOCYTES UA: NEGATIVE
Nitrite: NEGATIVE
PH: 5 (ref 5.0–8.0)
PROTEIN: NEGATIVE mg/dL
Specific Gravity, Urine: 1.019 (ref 1.005–1.030)

## 2016-12-30 LAB — I-STAT TROPONIN, ED
Troponin i, poc: 0 ng/mL (ref 0.00–0.08)
Troponin i, poc: 0 ng/mL (ref 0.00–0.08)

## 2016-12-30 MED ORDER — IOPAMIDOL (ISOVUE-370) INJECTION 76%
INTRAVENOUS | Status: AC
  Administered 2016-12-30: 100 mL
  Filled 2016-12-30: qty 100

## 2016-12-30 MED ORDER — SODIUM CHLORIDE 0.9 % IV BOLUS (SEPSIS)
1000.0000 mL | Freq: Once | INTRAVENOUS | Status: AC
  Administered 2016-12-30: 1000 mL via INTRAVENOUS

## 2016-12-30 NOTE — ED Provider Notes (Signed)
36 year old male signed out to me at change of shift by Michela Pitcher PA-C with CT PE study pending with anticipated d/c home if PE study negative and appropriate dispo if PE study positive.   Please see her note for more detailed H&P.  Briefly this is a 36 year old male with a history of CHF, hypertension, thyroid disease and cholelithiasis presenting with pleuritic chest pain and shortness of breath since yesterday.  Patient had recent lap chole on 12/03/2016 by Dr. Janee Morn.  He has several postop complications including AKI, bile leak, HCAP and right sided pleural effusion.   Physical Exam  BP 109/81   Pulse (!) 112   Temp 98 F (36.7 C) (Oral)   Resp (!) 23   SpO2 100%   Physical Exam  Constitutional: He appears well-developed and well-nourished.  HENT:  Head: Normocephalic and atraumatic.  Right Ear: External ear normal.  Left Ear: External ear normal.  Nose: Nose normal.  Mouth/Throat: Uvula is midline, oropharynx is clear and moist and mucous membranes are normal. No tonsillar exudate.  Eyes: Pupils are equal, round, and reactive to light. Right eye exhibits no discharge. Left eye exhibits no discharge. No scleral icterus.  Neck: Trachea normal. Neck supple. No spinous process tenderness present. No neck rigidity. Normal range of motion present.  Cardiovascular: Normal rate, regular rhythm and intact distal pulses.  No murmur heard. Pulses:      Radial pulses are 2+ on the right side, and 2+ on the left side.       Dorsalis pedis pulses are 2+ on the right side, and 2+ on the left side.       Posterior tibial pulses are 2+ on the right side, and 2+ on the left side.  No lower extremity swelling or edema. Calves symmetric in size bilaterally.  Pulmonary/Chest: Effort normal and breath sounds normal. He exhibits no tenderness.  Abdominal: Soft. Bowel sounds are normal. There is no tenderness. There is no rebound and no guarding.  Right upper quadrant tender to palpation.  2 surgical  drains in place with no surrounding erythema, fluctuance, or abnormal purulent drainage.   Musculoskeletal: He exhibits no edema.  Lymphadenopathy:    He has no cervical adenopathy.  Neurological: He is alert.  Skin: Skin is warm and dry. No rash noted. He is not diaphoretic.  Psychiatric: He has a normal mood and affect.  Nursing note and vitals reviewed.   ED Course  Procedures  MDM  37 year old male with a history of CHF, hypertension, thyroid disease and cholelithiasis presenting with pleuritic chest pain and shortness of breath since yesterday.  Patient had recent lap chole on 12/03/2016 by Dr. Janee Morn.  He has several postop complications including AKI, bile leak, HCAP and right sided pleural effusion. Patient has f/u with [facility] on 11/27.   Prior to sign out the patient had ECG with sinus tachycardia.  Troponins negative x2.  Lactic acid within normal limits after IV fluid administration.  Negative UA.  Reassuring CMP.  No leukocytosis.  CT scan showed no evidence of PE.  There continues to be a right-sided pleural effusion.  Patient with improving RUQ findings from prior CT scan of the abdomen pelvis.  Discussed findings with the patient.  Patient is to follow-up with PCP about today's visit.  Strict return precautions discussed.  Patient appears safe for discharge.       Jacinto Halim, PA-C 12/31/16 0050    Doug Sou, MD 12/31/16 [phone]

## 2016-12-30 NOTE — ED Provider Notes (Signed)
[facility] EMERGENCY [facility]PARTMENT Provider Note   CSN: [id number] Arrival date & time: 12/30/16  1146     History   Chief Complaint Chief Complaint  Patient presents with  . Shortness of Breath  . Chest Pain    HPI Bobby Kelly is a 36 y.o. male with history of CHF, HTN, thyroid disease, and cholelithiasis presents today with chief complaint acute onset, constant shortness of breath since yesterday.  He endorses pain with deep inspiration.  States he experienced central constant substernal chest pressure at around 8 AM this morning but this has resolved.  Shortness of breath worsens with exertion but is not positional.  He also endorses nonproductive cough.  He underwent lap chole on December 03, 2016 by Dr. Janee Mornhompson but was was not discharged until December 20, 2016 due to postop complications including AKI, bile leak, and HCAP Pneumonia as well as large right pleural effusion.  He has not tried anything for his symptoms.  Denies fevers, chills, hemoptysis, vomiting but does endorse persistent nausea since his procedure.  States that volume of drainage from his surgical drains lessened but then acutely increased yesterday.  He states his JP drain has been exuding a small amount of clear-yellow fluid from the site.  Still has some right upper quadrant pain.  He also mentions intermittent left calf cramping and pain, but denies leg swelling or palpitations.  The history is provided by the patient.  Chest Pain   Associated symptoms include abdominal pain, cough, nausea and shortness of breath. Pertinent negatives include no fever, no palpitations and no vomiting.    Past Medical History:  Diagnosis Date  . Allergy   . CHF (congestive heart failure) (HCC)   . Cholelithiasis   . Dry skin    nasal folds  . HTN (hypertension)   . Hypertension   . Thyroid disease    hyperactive in high school, had oral therapy.    . Wears contact lenses     Patient Active Problem List     Diagnosis Date Noted  . Biloma following surgery 12/26/2016  . Pleural effusion 12/22/2016  . HCAP (healthcare-associated pneumonia) 12/22/2016  . CHF (congestive heart failure) (HCC)   . Cholelithiasis   . Acute calculous cholecystitis 12/02/2016  . Routine general medical examination at a health care facility 11/03/2016  . Need for influenza vaccination 11/03/2016  . Class 2 obesity with serious comorbidity and body mass index (BMI) of 36.0 to 36.9 in adult 11/03/2016  . Vaccine counseling 11/03/2016  . Heart disease 11/03/2016  . Palpitation 11/03/2016  . Constipation 04/28/2016    Past Surgical History:  Procedure Laterality Date  . APPENDECTOMY    . CHOLECYSTECTOMY N/A 12/03/2016   Procedure: LAPAROSCOPIC CHOLECYSTECTOMY;  Surgeon: Violeta Gelinashompson, Burke, MD;  [facility];  Service: General;  Laterality: N/A;  . ERCP N/A 12/19/2016   Procedure: ENDOSCOPIC RETROGRADE CHOLANGIOPANCREATOGRAPHY (ERCP);  Surgeon: Vida RiggerMagod, Marc, MD;  [facility];  Service: Endoscopy;  Laterality: N/A;  . ESOPHAGOGASTRODUODENOSCOPY (EGD) WITH PROPOFOL N/A 12/13/2016   Procedure: ESOPHAGOGASTRODUODENOSCOPY (EGD) WITH PROPOFOL;  Surgeon: Willis Modenautlaw, William, MD;  [facility];  Service: Endoscopy;  Laterality: N/A;  . ESOPHAGOGASTRODUODENOSCOPY (EGD) WITH PROPOFOL N/A 12/15/2016   Procedure: ESOPHAGOGASTRODUODENOSCOPY (EGD) WITH PROPOFOL;  Surgeon: Willis Modenautlaw, William, MD;  [facility];  Service: Endoscopy;  Laterality: N/A;  . IR CATHETER TUBE CHANGE  12/23/2016  . IR THORACENTESIS ASP PLEURAL SPACE W/IMG GUIDE  12/23/2016       Home Medications  Prior to Admission medications   Medication Sig Start Date End Date Taking? Authorizing Provider  amoxicillin-clavulanate (AUGMENTIN) 875-125 MG tablet Take 1 tablet every 12 (twelve) hours for 5 days by mouth. 12/25/16 12/30/16 Yes Rayburn, Alphonsus Sias, PA-C  lisinopril (PRINIVIL,ZESTRIL) 20 MG tablet Take 1 tablet (20 mg total) by  mouth daily. 10/17/16 01/15/17 Yes Camnitz, Will Daphine Deutscher, MD  metoprolol succinate (TOPROL-XL) 50 MG 24 hr tablet Take 1 tablet (50 mg total) by mouth daily. Take with or immediately following a meal. 10/17/16  Yes Camnitz, Will Daphine Deutscher, MD  ondansetron (ZOFRAN ODT) 4 MG disintegrating tablet Take 1 tablet (4 mg total) by mouth every 8 (eight) hours as needed for nausea or vomiting. 11/26/16  Yes Khatri, Hina, PA-C  oxyCODONE (OXY IR/ROXICODONE) 5 MG immediate release tablet Take 1 tablet (5 mg total) every 4 (four) hours as needed by mouth for severe pain or breakthrough pain. 12/25/16  Yes Rayburn, Alphonsus Sias, PA-C  oxyCODONE-acetaminophen (PERCOCET/ROXICET) 5-325 MG tablet Take 1 tablet by mouth every 6 (six) hours as needed. pain 11/26/16  Yes [provider]  pravastatin (PRAVACHOL) 20 MG tablet Take 1 tablet (20 mg total) by mouth every evening. 11/07/16 11/07/17 Yes Tysinger, Kermit Balo, PA-C    Family History Family History  Problem Relation Age of Onset  . Hypertension Mother   . Cataracts Mother   . Hyperlipidemia Father   . Pancreatic cancer Maternal Grandmother   . Cancer Maternal Grandmother   . Diabetes Other   . Heart disease Neg Hx   . Stroke Neg Hx     Social History Social History   Tobacco Use  . Smoking status: Never Smoker  . Smokeless tobacco: Never Used  Substance Use Topics  . Alcohol use: Yes    Alcohol/week: 2.4 oz    Types: 3 Cans of beer, 1 Shots of liquor per week    Comment: occ, history of 2-3 years of heavier alcohol  . Drug use: No     Allergies   Patient has no known allergies.   Review of Systems Review of Systems  Constitutional: Negative for chills and fever.  Respiratory: Positive for cough and shortness of breath.   Cardiovascular: Positive for chest pain. Negative for palpitations and leg swelling.  Gastrointestinal: Positive for abdominal pain and nausea. Negative for blood in stool, constipation, diarrhea and vomiting.  All other  systems reviewed and are negative.    Physical Exam Updated Vital Signs BP 109/81   Pulse (!) 112   Temp 98 F (36.7 C) (Oral)   Resp (!) 23   SpO2 100%   Physical Exam  Constitutional: He appears well-developed and well-nourished. No distress.  HENT:  Head: Normocephalic and atraumatic.  Eyes: Conjunctivae are normal. Right eye exhibits no discharge. Left eye exhibits no discharge.  Neck: Normal range of motion. Neck supple. No JVD present. No tracheal deviation present.  Cardiovascular: Regular rhythm and intact distal pulses.  Borderline tachycardic, 2+ radial and DP/PT pulses bl, left Homans sign present with left calf tender to palpation.  No observable lower extremity edema   Pulmonary/Chest: Effort normal. Tachypnea noted. He has decreased breath sounds in the right lower field. He exhibits no tenderness, no laceration, no crepitus and no edema.  Abdominal: Soft. He exhibits no distension. There is tenderness.  Right upper quadrant tender to palpation.  2 surgical drains in place with no surrounding erythema, fluctuance, or abnormal purulent drainage.  Musculoskeletal: He exhibits no edema.  Right lower leg: He exhibits no tenderness and no edema.       Left lower leg: He exhibits tenderness. He exhibits no edema.  Neurological: He is alert.  Skin: Skin is warm and dry. Capillary refill takes less than 2 seconds. No erythema.  Psychiatric: He has a normal mood and affect. His behavior is normal.  Nursing note and vitals reviewed.    ED Treatments / Results  Labs (all labs ordered are listed, but only abnormal results are displayed) Labs Reviewed  COMPREHENSIVE METABOLIC PANEL - Abnormal; Notable for the following components:      Result Value   Glucose, Bld 112 (*)    Total Protein 8.2 (*)    Albumin 2.6 (*)    AST 60 (*)    ALT 93 (*)    Alkaline Phosphatase 214 (*)    All other components within normal limits  CBC WITH DIFFERENTIAL/PLATELET - Abnormal;  Notable for the following components:   Hemoglobin 11.7 (*)    HCT 37.8 (*)    RDW 15.8 (*)    Platelets 851 (*)    All other components within normal limits  I-STAT CG4 LACTIC ACID, ED - Abnormal; Notable for the following components:   Lactic Acid, Venous 2.03 (*)    All other components within normal limits  URINALYSIS, ROUTINE W REFLEX MICROSCOPIC  I-STAT TROPONIN, ED  I-STAT CG4 LACTIC ACID, ED  I-STAT TROPONIN, ED    EKG  EKG Interpretation  Date/Time:  Friday December 30 2016 11:49:43 EST Ventricular Rate:  101 PR Interval:  164 QRS Duration: 84 QT Interval:  352 QTC Calculation: 456 R Axis:   68 Text Interpretation:  Sinus tachycardia No significant change since last tracing Baseline wander Abnormal ekg Confirmed by Gerhard Munch [phone]) on 12/30/2016 4:16:10 PM       Radiology Dg Chest 2 View  Result Date: 12/30/2016 CLINICAL DATA:  Increased shortness of breath since yesterday, right-sided chest pain worse with inhalation today. EXAM: CHEST  2 VIEW COMPARISON:  Chest x-rays dated 12/25/2016, 12/23/2016 and 12/09/2014. FINDINGS: Stable opacity at the right lung base. No new lung findings. No pneumothorax seen. Heart size and mediastinal contours are stable. No acute or suspicious osseous finding. Two drains below the right hemidiaphragm are stable in position. IMPRESSION: 1. No significant interval change. Persistent pleural effusion at the right lung base, again with appearance likely accentuated by elevation of the right hemidiaphragm as demonstrated on earlier CT. Based on the lateral view, the pleural effusion component may be slightly decreased in size compared to chest x-ray of 12/25/2016. 2. Lucency at the lateral aspects of the right lung base is also stable, again compatible with the air-containing perihepatic fluid collection located below the right hemidiaphragm better demonstrated on CT of 12/22/2016, with nearby pigtail catheter. Electronically Signed   By:  Bary Richard M.D.   On: 12/30/2016 12:57    Procedures Procedures (including critical care time)  Medications Ordered in ED Medications  sodium chloride 0.9 % bolus 1,000 mL (1,000 mLs Intravenous New Bag/Given 12/30/16 1443)  iopamidol (ISOVUE-370) 76 % injection (100 mLs  Contrast Given 12/30/16 1628)     Initial Impression / Assessment and Plan / ED Course  I have reviewed the triage vital signs and the nursing notes.  Pertinent labs & imaging results that were available during my care of the patient were reviewed by me and considered in my medical decision making (see chart for details).     Patient with shortness  of breath which began yesterday as well as chest pain which began earlier today but resolved.  He has pleuritic pain with deep inspiration additionally.  Afebrile, borderline tachycardic and tachypneic while in the ED chest x-ray shows no interval change.  And shows possible improvement in right-sided pleural effusion, consistent with diminished breath sounds in the right lower field on auscultation of the lungs.  Initial lactate slightly elevated, with resolution while in the ED.  He does have a slight increase in his LFTs as compared to 5 days ago.  Otherwise well-appearing.  Surgical drains without evidence of surrounding cellulitis or soft tissue infection.  Initial troponin is normal.  EKG shows sinus tachycardia but no evidence of ST segment abnormality.  I doubt ACS or MI contributing to symptoms.  With a recent history of surgical procedure and acute onset of shortness of breath, will obtain CTA of the chest to evaluate for PE.  Also awaiting second troponin.  4:49 PM Signed out to oncoming provider PA Bobby Kelly.  Awaiting second troponin and results of CTA of chest.  If no evidence of PE or other significant abnormality, patient stable for discharge home with follow-up with Dr. Janee Morn as scheduled on Tuesday.  He may continue taking his home medications as prescribed,  with good return precautions to return to the ED if any worsening signs or symptoms develop such as severe chest pain, fevers, abnormal drainage, or persisting nausea or vomiting.  If CTA shows evidence of PE, he  will require anticoagulation, and will likely require inpatient admission for management and general surgery consult.  Final Clinical Impressions(s) / ED Diagnoses   Final diagnoses:  Shortness of breath  Atypical chest pain    ED Discharge Orders    None       Bennye Alm 12/30/16 1651    Gerhard Munch, MD 12/31/16 [phone]

## 2016-12-30 NOTE — ED Triage Notes (Signed)
Pt reports increased SOB since yesterday, CP worse with inhalation. Pt endorses n, lightheadedness, dizziness, cough.  Pt reports gall bladder removed x 3 weeks, reports persistent infection since, drain in place. Pt reports taking last abx this am. R breath sounds decreased.

## 2016-12-30 NOTE — ED Notes (Signed)
Patient transported to CT 

## 2016-12-30 NOTE — Discharge Instructions (Signed)
Read instructions below for reasons to return to the Emergency Department. It is recommended that your follow up with your Primary Care Doctor in regards to today's visit. If you do not have a doctor, use the resource guide listed below to help you find one.   Tests performed today include: An EKG of your heart A chest x-ray Cardiac enzymes - a blood test for heart muscle damage Blood counts and electrolytes CT of your chest - no evidence of blood clot.  Vital signs. See below for your results today.   Chest Pain (Nonspecific)  HOME CARE INSTRUCTIONS  For the next few days, avoid physical activities that bring on chest pain. Continue physical activities as directed.  Do not smoke cigarettes or drink alcohol until your symptoms are gone. If you do smoke, it is time to quit. You may receive instructions and counseling on how to stop smoking. Only take over-the-counter or prescription medicine for pain, discomfort, or fever as directed by your caregiver.  Follow your caregiver's suggestions for further testing if your chest pain does not go away.  Keep any follow-up appointments you made. If you do not go to an appointment, you could develop lasting (chronic) problems with pain. If there is any problem keeping an appointment, you must call to reschedule.  SEEK MEDICAL CARE IF:  You think you are having problems from the medicine you are taking. Read your medicine instructions carefully.  Your chest pain does not go away, even after treatment.  You develop a rash with blisters on your chest.  SEEK IMMEDIATE MEDICAL CARE IF:  You have increased chest pain or pain that spreads to your arm, neck, jaw, back, or belly (abdomen).  You develop shortness of breath, an increasing cough, or you are coughing up blood.  You have severe back or abdominal pain, feel sick to your stomach (nauseous) or throw up (vomit).  You develop severe weakness, fainting, or chills.  You have an oral temperature above 102  F (38.9 C), not controlled by medicine.  THIS IS AN EMERGENCY. Do not wait to see if the pain will go away. Get medical help at once. Call your local emergency services (911 in [place] Do not drive yourself to the hospital. Additional Information:  Your vital signs today were: BP 113/76    Pulse (!) 104    Temp 98 F (36.7 C) (Oral)    Resp (!) 24    SpO2 97%  If your blood pressure (BP) was elevated above 135/85 this visit, please have this repeated by your doctor within one month. ---------------

## 2017-01-03 ENCOUNTER — Other Ambulatory Visit: Payer: Self-pay | Admitting: General Surgery

## 2017-01-03 ENCOUNTER — Ambulatory Visit
Admission: RE | Admit: 2017-01-03 | Discharge: 2017-01-03 | Disposition: A | Discharge status: Home / Self Care | Payer: No Typology Code available for payment source | Source: Ambulatory Visit | Attending: General Surgery | Admitting: General Surgery

## 2017-01-03 ENCOUNTER — Encounter: Payer: Self-pay | Admitting: *Deleted

## 2017-01-03 ENCOUNTER — Ambulatory Visit
Admission: RE | Admit: 2017-01-03 | Discharge: 2017-01-03 | Disposition: A | Discharge status: Home / Self Care | Payer: No Typology Code available for payment source | Source: Ambulatory Visit | Attending: Radiology | Admitting: Radiology

## 2017-01-03 DIAGNOSIS — K9189 Other postprocedural complications and disorders of digestive system: Principal | ICD-10-CM

## 2017-01-03 DIAGNOSIS — K650 Generalized (acute) peritonitis: Secondary | ICD-10-CM

## 2017-01-03 DIAGNOSIS — K838 Other specified diseases of biliary tract: Secondary | ICD-10-CM

## 2017-01-03 HISTORY — PX: IR RADIOLOGIST EVAL & MGMT: IMG5224

## 2017-01-03 MED ORDER — IOPAMIDOL (ISOVUE-300) INJECTION 61%
100.0000 mL | Freq: Once | INTRAVENOUS | Status: AC | PRN
  Administered 2017-01-03: 100 mL via INTRAVENOUS

## 2017-01-03 NOTE — Progress Notes (Signed)
Patient ID: Bobby Kelly, male   DOB: 1980/07/16, 36 y.o.   MRN: [id number]       Chief Complaint: Postcholecystectomy bile leak, status post percutaneous drain    Referring Physician(s): Turpin,Pamela  History of Present Illness: Bobby Kelly is a 36 y.o. male who had acute gangrenous cholecystitis and underwent cholecystectomy. This was complicated by a bile leak/abscess. Patient is now status post surgical drain and percutaneous drain. He returns for outpatient follow-up and repeat CT. Overall he is doing very well. No significant abdominal pain or fevers. He continues on antibiotics. He reports minimal output from the right upper quadrant pigtail drain to the gravity bag. There is still 100-150 mL blood-tinged output from the surgical drain.  Past Medical History:  Diagnosis Date  . Allergy   . CHF (congestive heart failure) (HCC)   . Cholelithiasis   . Dry skin    nasal folds  . HTN (hypertension)   . Hypertension   . Thyroid disease    hyperactive in high school, had oral therapy.    . Wears contact lenses     Past Surgical History:  Procedure Laterality Date  . APPENDECTOMY    . CHOLECYSTECTOMY N/A 12/03/2016   Procedure: LAPAROSCOPIC CHOLECYSTECTOMY;  Surgeon: Violeta Gelinas, MD;  [facility] OR;  Service: General;  Laterality: N/A;  . ERCP N/A 12/19/2016   Procedure: ENDOSCOPIC RETROGRADE CHOLANGIOPANCREATOGRAPHY (ERCP);  Surgeon: Vida Rigger, MD;  Location: [facility] ENDOSCOPY;  Service: Endoscopy;  Laterality: N/A;  . ESOPHAGOGASTRODUODENOSCOPY (EGD) WITH PROPOFOL N/A 12/13/2016   Procedure: ESOPHAGOGASTRODUODENOSCOPY (EGD) WITH PROPOFOL;  Surgeon: Willis Modena, MD;  Location: [facility] ENDOSCOPY;  Service: Endoscopy;  Laterality: N/A;  . ESOPHAGOGASTRODUODENOSCOPY (EGD) WITH PROPOFOL N/A 12/15/2016   Procedure: ESOPHAGOGASTRODUODENOSCOPY (EGD) WITH PROPOFOL;  Surgeon: Willis Modena, MD;  Location: [facility] ENDOSCOPY;  Service: Endoscopy;  Laterality: N/A;  . IR CATHETER TUBE CHANGE   12/23/2016  . IR RADIOLOGIST EVAL & MGMT  01/03/2017  . IR THORACENTESIS ASP PLEURAL SPACE W/IMG GUIDE  12/23/2016    Allergies: Patient has no known allergies.  Medications: Prior to Admission medications   Medication Sig Start Date End Date Taking? Authorizing Provider  lisinopril (PRINIVIL,ZESTRIL) 20 MG tablet Take 1 tablet (20 mg total) by mouth daily. 10/17/16 01/15/17  Camnitz, Andree Coss, MD  metoprolol succinate (TOPROL-XL) 50 MG 24 hr tablet Take 1 tablet (50 mg total) by mouth daily. Take with or immediately following a meal. 10/17/16   Camnitz, Andree Coss, MD  ondansetron (ZOFRAN ODT) 4 MG disintegrating tablet Take 1 tablet (4 mg total) by mouth every 8 (eight) hours as needed for nausea or vomiting. 11/26/16   Khatri, Hina, PA-C  oxyCODONE (OXY IR/ROXICODONE) 5 MG immediate release tablet Take 1 tablet (5 mg total) every 4 (four) hours as needed by mouth for severe pain or breakthrough pain. 12/25/16   Rayburn, Alphonsus Sias, PA-C  oxyCODONE-acetaminophen (PERCOCET/ROXICET) 5-325 MG tablet Take 1 tablet by mouth every 6 (six) hours as needed. pain 11/26/16   [provider]  pravastatin (PRAVACHOL) 20 MG tablet Take 1 tablet (20 mg total) by mouth every evening. 11/07/16 11/07/17  Tysinger, Kermit Balo, PA-C     Family History  Problem Relation Age of Onset  . Hypertension Mother   . Cataracts Mother   . Hyperlipidemia Father   . Pancreatic cancer Maternal Grandmother   . Cancer Maternal Grandmother   . Diabetes Other   . Heart disease Neg Hx   . Stroke Neg Hx     Social History  Socioeconomic History  . Marital status: Married    Spouse name: Not on file  . Number of children: Not on file  . Years of education: Not on file  . Highest education level: Not on file  Social Needs  . Financial resource strain: Not on file  . Food insecurity - worry: Not on file  . Food insecurity - inability: Not on file  . Transportation needs - medical: Not on file  .  Transportation needs - non-medical: Not on file  Occupational History  . Not on file  Tobacco Use  . Smoking status: Never Smoker  . Smokeless tobacco: Never Used  Substance and Sexual Activity  . Alcohol use: Yes    Alcohol/week: 2.4 oz    Types: 3 Cans of beer, 1 Shots of liquor per week    Comment: occ, history of 2-3 years of heavier alcohol  . Drug use: No  . Sexual activity: Not on file  Other Topics Concern  . Not on file  Social History Narrative   Lives at home with wife and 1yo son. Son's birthday end of September.   Exercise - some jogging 2-3 times per week.    Considering joining gym.   10/2016      Review of Systems: A 12 point ROS discussed and pertinent positives are indicated in the HPI above.  All other systems are negative.  Review of Systems  Vital Signs: There were no vitals taken for this visit.  Physical Exam  Constitutional: He appears well-developed and well-nourished. No distress.  Eyes: Conjunctivae are normal. No scleral icterus.  Abdominal: Soft. Bowel sounds are normal. He exhibits no distension. There is no tenderness.  Both drain sites are clean, dry and intact. No signs of cellulitis.  Skin: He is not diaphoretic.      Imaging: Dg Chest 1 View  Result Date: 12/23/2016 CLINICAL DATA:  Short of breath.  Post right thoracentesis EXAM: CHEST 1 VIEW COMPARISON:  12/21/2016 FINDINGS: Moderate right pleural effusion unchanged. Extensive right lower lobe airspace disease unchanged. Negative for pneumothorax. Mild left lower lobe atelectasis. No left effusion. Pigtail drainage catheter right upper quadrant IMPRESSION: Negative for pneumothorax post right thoracentesis. Moderate right pleural effusion remains Bibasilar airspace disease right greater than left. Electronically Signed   By: Marlan Palau M.D.   On: 12/23/2016 12:53   Dg Chest 2 View  Result Date: 12/30/2016 CLINICAL DATA:  Increased shortness of breath since yesterday, right-sided  chest pain worse with inhalation today. EXAM: CHEST  2 VIEW COMPARISON:  Chest x-rays dated 12/25/2016, 12/23/2016 and 12/09/2014. FINDINGS: Stable opacity at the right lung base. No new lung findings. No pneumothorax seen. Heart size and mediastinal contours are stable. No acute or suspicious osseous finding. Two drains below the right hemidiaphragm are stable in position. IMPRESSION: 1. No significant interval change. Persistent pleural effusion at the right lung base, again with appearance likely accentuated by elevation of the right hemidiaphragm as demonstrated on earlier CT. Based on the lateral view, the pleural effusion component may be slightly decreased in size compared to chest x-ray of 12/25/2016. 2. Lucency at the lateral aspects of the right lung base is also stable, again compatible with the air-containing perihepatic fluid collection located below the right hemidiaphragm better demonstrated on CT of 12/22/2016, with nearby pigtail catheter. Electronically Signed   By: Bary Richard M.D.   On: 12/30/2016 12:57   Dg Chest 2 View  Result Date: 12/25/2016 CLINICAL DATA:  Pleural effusion,difculty breathing,chf+ EXAM:  CHEST  2 VIEW COMPARISON:  Chest x-rays dated 12/23/2016 and 12/21/2016. CT dated 12/22/2016. FINDINGS: Stable right pleural effusion, moderate to large in size. Based on the earlier CT, some component of the right basilar opacity is due to elevation of the right hemidiaphragm. Lucency at the lateral aspects of the right lung base is compatible with air seen in a perihepatic fluid collection on recent CT, with percutaneous pigtail drain nearby. Probable mild atelectasis at the left lung base. No new lung findings. Heart size and mediastinal contours are stable. IMPRESSION: 1. Stable right pleural effusion, moderate to large in size. This right basilar opacity is accentuated by elevation of the right hemidiaphragm, as demonstrated on recent CT. 2. Probable mild atelectasis at the left  lung base. 3. Lucency at the lateral aspects of the right lung base is compatible with the air-containing perihepatic fluid collection demonstrated on recent CT of 12/22/2016, with percutaneous pigtail drain nearby. Electronically Signed   By: Bary RichardStan  Maynard M.D.   On: 12/25/2016 12:56   Ct Chest Wo Contrast  Result Date: 12/22/2016 CLINICAL DATA:  Inpatient. Status post cholecystectomy 12/03/2016 complicated by gallbladder fossa and right perihepatic complex collection requiring percutaneous drain placement 12/08/2016. Persistent right chest and back pain. Follow-up right pleural effusion. EXAM: CT CHEST WITHOUT CONTRAST TECHNIQUE: Multidetector CT imaging of the chest was performed following the standard protocol without IV contrast. COMPARISON:  Chest radiograph from one day prior. 12/20/2016 CT abdomen/pelvis. FINDINGS: Cardiovascular: Normal heart size. No significant pericardial fluid/thickening. Great vessels are normal in course and caliber. Mediastinum/Nodes: No discrete thyroid nodules. Unremarkable esophagus. No pathologically enlarged axillary, mediastinal or gross hilar lymph nodes, noting limited sensitivity for the detection of hilar adenopathy on this noncontrast study. Lungs/Pleura: No pneumothorax. No left pleural effusion. Moderate to large dependent right pleural effusion, increased since 12/20/2016 CT abdomen study. Complete right lower lobe compressive atelectasis. Segmental medial right middle lobe compressive atelectasis. Segmental medial right upper lobe atelectasis. Mild-to-moderate platelike left lower lobe atelectasis. Subsegmental lingular atelectasis. Otherwise no acute consolidative airspace disease, lung masses or significant pulmonary nodules in the limited aerated portions of the lungs. Upper abdomen: Mild-to-moderate elevation of the right hemidiaphragm. Percutaneous pigtail drainage catheter terminates anteriorly in the perihepatic space, unchanged. Crescentic right  perihepatic collection measuring up to 4.0 cm thickness (series 7/ image 50), increased from 2.5 cm on 12/20/2016 CT abdomen study. Slightly increased mass-effect on the peripheral right liver lobe capsule by the perihepatic collection. Expected gas within the nondependent portion of the right perihepatic collection. Partial visualization of the tip of a JP drain in the intersegmental fissure anteriorly. Musculoskeletal: No aggressive appearing focal osseous lesions. Mild thoracic spondylosis. IMPRESSION: 1. Moderate to large dependent right pleural effusion, increased since 12/20/2016 CT abdomen study . 2. Complete right lower lobe and mild-to-moderate right middle lobe, right upper lobe and left lower lobe atelectasis. 3. Partially visualized right perihepatic collection appears mildly increased in size since 12/20/2016 CT abdomen study. Stable position of right upper quadrant percutaneous pigtail drain within the ventral portion of this collection. Electronically Signed   By: Delbert PhenixJason A Poff M.D.   On: 12/22/2016 13:50   Ct Angio Chest Pe W And/or Wo Contrast  Result Date: 12/30/2016 CLINICAL DATA:  Shortness of breath with cough and chest pain, worsened yesterday. Status post cholecystectomy 3 weeks ago with placement of a drainage catheter in the biliary fossa for bile leak 12/23/2016. EXAM: CT ANGIOGRAPHY CHEST WITH CONTRAST TECHNIQUE: Multidetector CT imaging of the chest was performed using the standard  protocol during bolus administration of intravenous contrast. Multiplanar CT image reconstructions and MIPs were obtained to evaluate the vascular anatomy. CONTRAST:  100 ml ISOVUE-370 IOPAMIDOL (ISOVUE-370) INJECTION 76% COMPARISON:  CT chest 12/22/2016. PA and lateral chest 12/25/2016 12/30/2016. CT abdomen and pelvis 12/20/2016. FINDINGS: Cardiovascular: No pulmonary embolus is identified. Heart size is normal. No calcific atherosclerosis. No pericardial effusion. Mediastinum/Nodes: No axillary, hilar or  mediastinal lymphadenopathy. No hiatal hernia. Thyroid gland appears normal. Lungs/Pleura: Moderate right pleural effusion is seen with associated compressive atelectasis. Discoid atelectasis in the left lower lobe is also noted. Upper Abdomen: Perihepatic fluid is partially visualized but appears decreased compared with the prior CT abdomen and pelvis. Drainage catheter is in place in the gallbladder fossa. There is small amount of fluid about the catheter. There is also partial visualization of an air and fluid which had measured 5.4 x 15.3 cm in the axial plane on the prior examination and today measures 7.8 x 4.5 cm at approximately the same level. Small volume of free intraperitoneal air is presumably related to the patient's catheters. Surgical drain is partially visualized. Musculoskeletal: No acute or focal bony abnormality. Review of the MIP images confirms the above findings. IMPRESSION: Negative for pulmonary embolus. Moderate pleural fluid collection on the right with associated compressive atelectasis. Drainage catheter in the gallbladder fossa and partial visualization of a surgical drain in the right upper quadrant. Only a small amount of fluid is seen about the catheter in the gallbladder fossa. Air and fluid collection in the right upper quadrant is partially visualized but measures smaller than on the comparison CT abdomen and pelvis. Electronically Signed   By: Drusilla Kanner M.D.   On: 12/30/2016 17:01   Nm Hepatobiliary Liver Func  Result Date: 12/11/2016 CLINICAL DATA:  Cholecystectomy, now of perihepatic fluid collection. EXAM: NUCLEAR MEDICINE HEPATOBILIARY IMAGING TECHNIQUE: Sequential images of the abdomen were obtained out to 60 minutes following intravenous administration of radiopharmaceutical. RADIOPHARMACEUTICALS:  5.6 mCi Tc-85m  Choletec IV COMPARISON:  CT scan 12/08/2016 FINDINGS: There is prompt uptake of radiopharmaceutical from the blood pool. Biliary tree and bowel  activity noted at 5 minutes. At 5 minutes, and progressing over the next hour, there is filling of an abnormal large collection along the right side of the liver, compatible with bile leak into the large fluid collection shown on CT scan. This is not shown to track down along the right paracolic gutter. IMPRESSION: 1. Bile leak into a large collection along the right side of the liver. Electronically Signed   By: Gaylyn Rong M.D.   On: 12/11/2016 14:25   Ct Abdomen Pelvis W Contrast  Result Date: 01/03/2017 CLINICAL DATA:  Postop bile leak, gangrenous cholecystitis, status post percutaneous drains, outpatient follow-up EXAM: CT ABDOMEN AND PELVIS WITH CONTRAST TECHNIQUE: Multidetector CT imaging of the abdomen and pelvis was performed using the standard protocol following bolus administration of intravenous contrast. CONTRAST:  ISOVUE-300 IOPAMIDOL (ISOVUE-300) INJECTION 61% COMPARISON:  12/20/2016 FINDINGS: Lower chest: Decrease in the right pleural effusion. Persistent bibasilar atelectasis. Normal heart size. No pericardial effusion. Hepatobiliary: Interval exchange and reposition of the right upper quadrant perihepatic drain. There is improvement in the subdiaphragmatic/perihepatic loculated fluid collection along the right hepatic dome and right lateral aspect of the liver following drain reposition. A small residual collection remains with a few scattered gas bubbles. Measuring at a similar level, the residual subhepatic anterior collection/hematoma is also smaller measuring 12.2 x 3.6 cm, previously 15.3 x 5.4 cm. Stable position of the  surgical drain as well. Stable position of the endoscopic common bile duct stent in the distal CBD terminating in the duodenum. No new abdominal or pelvic fluid collections. Pneumobilia noted as before. No focal hepatic abnormality. Hepatic and portal veins remain patent. Pancreas: Unremarkable. No pancreatic ductal dilatation or surrounding inflammatory  changes. Spleen: Normal in size without focal abnormality. Adrenals/Urinary Tract: Adrenal glands are unremarkable. Kidneys are normal, without renal calculi, focal lesion, or hydronephrosis. Bladder is unremarkable. Stomach/Bowel: Negative for bowel obstruction, significant dilatation, ileus, or free air. Appendix not visualized. Vascular/Lymphatic: No significant atherosclerotic disease. Negative for aneurysm. No significant vascular process. Mesenteric and renal vasculature remain patent. Reproductive: Unremarkable by CT. Seminal vesicles and prostate normal in size. Other: No abdominal wall hernia or abnormality. No abdominopelvic ascites. Musculoskeletal: No acute or significant osseous findings. IMPRESSION: Interval retraction / reposition of the right upper quadrant pigtail drain catheter. Overall improvement of the subdiaphragmatic / perihepatic fluid collection along the hepatic dome and lateral margin. Small sliver of air and fluid remain in these regions. Dominant anterior subhepatic fluid collection/hematoma is also smaller in size, measurements as above. Slight decrease in the right pleural effusion with persistent bibasilar atelectasis No new abdominal or pelvic fluid collection. Electronically Signed   By: Judie Petit.  Oseph Imburgia M.D.   On: 01/03/2017 11:29   Ct Abdomen Pelvis W Contrast  Result Date: 12/21/2016 CLINICAL DATA:  Patient had a drain placed on 12/09/11, he woke up with back pain in his hips, up into his shoulders and neck. abnormal xray suspicious for subdiaphragmatic air^ EXAM: CT ABDOMEN AND PELVIS WITH CONTRAST TECHNIQUE: Multidetector CT imaging of the abdomen and pelvis was performed using the standard protocol following bolus administration of intravenous contrast. CONTRAST:  ISOVUE-300 IOPAMIDOL (ISOVUE-300) INJECTION 61% COMPARISON:  12/07/2016 FINDINGS: Lower chest: Interval increase in moderate right pleural effusion and Consolidation/ atelectasis at the right lung base.No  pneumothorax. Hepatobiliary: Interval placement of right subdiaphragmatic/ perihepatic drain with decompression of the dominant component of the perihepatic collection. A few gas bubbles in the residual loculated fluid collection presumably from flushing regimen. No liver parenchymal lesion. Cholecystectomy clips. Plastic biliary stent has been placed, with scattered gas in the central intrahepatic biliary tree implying patency. No biliary ductal dilatation. Pancreas: Unremarkable. No pancreatic ductal dilatation or surrounding inflammatory changes. Spleen: Normal in size without focal abnormality. Adrenals/Urinary Tract: Normal adrenals. Kidneys unremarkable. No hydronephrosis or focal renal lesion. Urinary bladder incompletely distended. Stomach/Bowel: Stomach is incompletely distended. Small bowel decompressed. Appendix surgically absent. Colon is nondilated, unremarkable. Vascular/Lymphatic: No abdominal or pelvic adenopathy. No significant vascular pathology identified. Reproductive: Prostate is unremarkable. Other: Trace pelvic ascites. No free air. Right anterior peritoneal surgical drain catheter remains in place with an adjacent complex loculated anterior peritoneal collection measuring 15.3 x 5.4 cm maximum transverse dimensions (previously 17.4 x 6.4). No new abdominal fluid collections. Musculoskeletal: No acute or significant osseous findings. IMPRESSION: 1. No free air. 2. Interval pigtail drain placement into the perihepatic loculated complex collection with partial decompression, without apparent complication. 3. Stable surgical drain with slight decrease in size of adjacent complex fluid collection or hematoma. 4. Increase in moderate right pleural effusion and adjacent right lower lung consolidation/atelectasis. 5. Interval plastic biliary stent placement without apparent complication. Electronically Signed   By: Corlis Leak M.D.   On: 12/21/2016 08:38   Ct Abdomen Pelvis W Contrast  Result  Date: 12/07/2016 CLINICAL DATA:  Abdominal pain. Recent cholecystectomy on 12/03/2016. EXAM: CT ABDOMEN AND PELVIS WITH CONTRAST TECHNIQUE: Multidetector CT imaging  of the abdomen and pelvis was performed using the standard protocol following bolus administration of intravenous contrast. CONTRAST:  < 100 cc> ISOVUE-300 IOPAMIDOL (ISOVUE-300) INJECTION 61% COMPARISON:  Abdominal ultrasound dated November 26, 2016. FINDINGS: Lower chest: Small right pleural effusion. Bilateral lower lobe subsegmental atelectasis. Hepatobiliary: No focal liver abnormality. The gallbladder is surgically absent. No biliary dilatation. There is a large, loculated, thick-walled fluid collection in the right upper quadrant which exerts mass effect on the liver. The collection extends into the gallbladder fossa, and along the anterior abdominal wall, where it is more hyperdense. The more superior, lower density component of the fluid collection measures approximately 19.5 x 8.3 x 13.1 cm (AP by transverse by CC), while the more hyperdense, inferior component along the anterior abdominal wall measures approximately 6.4 x 17.4 x 8.3 cm (AP by transverse by CC). Pancreas: Unremarkable. No pancreatic ductal dilatation or surrounding inflammatory changes. Spleen: Normal in size without focal abnormality. Adrenals/Urinary Tract: Adrenal glands are unremarkable. Kidneys are normal, without renal calculi, focal lesion, or hydronephrosis. Bladder is decompressed. Stomach/Bowel: Stomach is within normal limits. Appendix is surgically absent. No evidence of bowel wall thickening, distention, or inflammatory changes. Vascular/Lymphatic: No significant vascular findings are present. No enlarged abdominal or pelvic lymph nodes. Reproductive: Prostate is unremarkable. Other: No pneumoperitoneum. Surgical drain in the right upper quadrant. Musculoskeletal: No acute or significant osseous findings. IMPRESSION: 1. Recent cholecystectomy with thick walled,  loculated, complex fluid collection in the right upper quadrant and gallbladder fossa, exerting mass effect on the liver. The superior, more low-density component measuring up to 19.5 cm is concerning for abscess, with biloma also in the differential. The inferior, more hyperdense component along the anterior abdominal wall measuring up to 17.4 cm is suspicious for hematoma. 2. Small right pleural effusion. Bilateral lower lobe subsegmental atelectasis. Electronically Signed   By: Obie Dredge M.D.   On: 12/07/2016 20:55   Ir Catheter Tube Change  Result Date: 12/23/2016 CLINICAL DATA:  History of bile leak following cholecystectomy, post percutaneous drainage catheter placement by Dr. Bonnielee Haff on 12/08/2016. Subsequent abdominal CT performed 12/22/2016 demonstrates resolution of the fluid around the percutaneous drainage catheter however residual fluid is seen about the level of the gallbladder fossa. As such, fluoroscopic guided drainage catheter exchange and repositioning has been requested. EXAM: ABSCESS DRAINAGE CATHETER EXCHANGE COMPARISON:  CT abdomen and pelvis - 12/22/2016; 12/20/2016; 12/07/2016; CT-guided percutaneous drainage catheter placement -12/08/2016 CONTRAST:  10 cc Isovue 300, administered via the existing percutaneous drainage catheter MEDICATIONS: None. The patient is currently admitted to the hospital and receiving intravenous antibiotics. Antibiotics were administered within an appropriate time frame prior to the initiation of the procedure. ANESTHESIA/SEDATION: None FLUOROSCOPY TIME:  1 minute 18 seconds (16 mGy) FINDINGS: With the patient positioned left lateral decubitus on the fluoroscopy table, the external portion of the existing right upper quadrant percutaneous drainage catheter nodule with surrounding skin was prepped and draped in usual sterile fashion. A preprocedural spot fluoroscopic image was obtained of the right upper abdominal quadrant existing percutaneous drainage  catheter. A small amount of contrast was injected via the existing percutaneous drainage catheter and several fluoroscopic images were obtained in various obliquities. The external portion of the percutaneous drainage catheter was cut and cannulated with a short Amplatz wire. Under intermittent fluoroscopic guidance, the existing percutaneous drainage catheter was exchanged for a new 12 French percutaneous drainage catheter which was repositioned more caudally at the level of the gallbladder fossa. Contrast injection confirmed appropriate positioning and functionality of the  drainage catheter. The percutaneous drainage catheter was secured in place within interrupted suture and a StatLock device. The drain was connected to a gravity bag. A dressing was placed. The patient tolerated the procedure well without immediate postprocedural complication. IMPRESSION: Successful fluoroscopic guided repositioning of right upper quadrant abdominal percutaneous drainage catheter, now positioned at the level the gallbladder fossa. Repositioning yielded return of bilious appearing fluid. Electronically Signed   By: Simonne Come M.D.   On: 12/23/2016 15:36   Dg Sinus/fist Tube Chk-non Gi  Result Date: 01/03/2017 CLINICAL DATA:  Bile leak status post cholecystectomy, status post percutaneous drain. Outpatient follow-up. EXAM: ABSCESS INJECTION CONTRAST:  5 cc Omnipaque 300 FLUOROSCOPY TIME:  Fluoroscopy Time:  36 seconds Radiation Exposure Index (if provided by the fluoroscopic device): 18 mGy COMPARISON:  12/23/2016 FINDINGS: Contrast injection performed of the right upper quadrant gallbladder fossa abscess drain. fluoroscopic imaging performed. Residual abscess cavity opacifies. Contrast tracks along the gallbladder fossa and communicates with the residual cystic duct stump. Contrast opacifies the biliary confluence and common bile duct. Endoscopic stent noted in the distal CBD. Contrast drains into the duodenum. Small  persistent filling defect in the residual cystic duct could represent a residual cystic duct stone. IMPRESSION: Positive for persistent bile leak from the gallbladder fossa fluid collection to the residual cystic duct stump opacifying the biliary system as above. Electronically Signed   By: Judie Petit.  Salwa Bai M.D.   On: 01/03/2017 11:17   Dg Chest Port 1 View  Result Date: 12/21/2016 CLINICAL DATA:  Shortness of breath, chest pain EXAM: PORTABLE CHEST 1 VIEW COMPARISON:  12/20/2016 FINDINGS: Increasing atelectasis or infiltrate at the left lung base. Small moderate right pleural effusion with dense consolidation at the right base. Cardiomegaly with vascular congestion. No pneumothorax. Drainage catheter in the right upper quadrant with surgical clips. Vague lucency at the right base IMPRESSION: 1. Vague lucency at the right base, uncertain if this is related to pleural effusion along the fissure creating false lucency at the right base versus small amount of pneumoperitoneum. CT chest would help to clarify this finding 2. Worsening atelectasis or infiltrate at the left base 3. Small moderate right pleural effusion 4. Cardiomegaly with vascular congestion Electronically Signed   By: Jasmine Pang M.D.   On: 12/21/2016 18:33   Dg Ercp Biliary & Pancreatic Ducts  Result Date: 12/19/2016 CLINICAL DATA:  Anastomotic leak of biliary tree. History of cholecystectomy. EXAM: ERCP TECHNIQUE: Multiple spot images obtained with the fluoroscopic device and submitted for interpretation post-procedure. FLUOROSCOPY TIME:  Fluoroscopy Time:  146 seconds Number of Acquired Spot Images: 7 COMPARISON:  CT 11/09/2016 FINDINGS: Cannulation and opacification of the extrahepatic biliary system. There is a pigtail drain and a surgical drain present. The biliary system is non dilated. There appears to be contrast within the cystic duct and filling of an irregular space near the pigtail drain. Findings are compatible with a biliary leak  which could be originating from or near the cystic duct. A nonmetallic biliary stent was placed in the distal common bile duct. IMPRESSION: Evidence for a bile leak. Placement of a nonmetallic biliary stent. These images were submitted for radiologic interpretation only. Please see the procedural report for the amount of contrast and the fluoroscopy time utilized. Electronically Signed   By: Richarda Overlie M.D.   On: 12/19/2016 09:18   Dg Abd Acute W/chest  Result Date: 12/20/2016 CLINICAL DATA:  Abdominal pain and nausea. Previous cholecystectomy and appendectomy. EXAM: DG ABDOMEN ACUTE W/ 1V CHEST COMPARISON:  Chest x-ray of December 09, 2014 FINDINGS: There is new volume loss at the right lung base. The right upper lobe is clear. The left lung is clear. There is no pulmonary vascular congestion. The cardiac silhouette is mildly enlarged but likely stable. Within the abdomen there is a pigtail drainage catheter in the right upper quadrant. There is a common bile duct stent in place. There is an additional drainage catheter which terminates superior to the superior margin of the common bile duct stent. There is a trace of gas in the right upper quadrant which may lie within the subdiaphragmatic region or subpulmonic region. The bowel gas pattern is normal. There surgical clips in the gallbladder fossa. There is gentle spinal curvature convex toward the left centered at L1. IMPRESSION: Drainage tubes in place in the right upper quadrant. Possible small amount of subdiaphragmatic gas versus subpulmonic gas. New increased density at the right lung base consistent with atelectasis or pneumonia. Electronically Signed   By: David  [place] M.D.   On: 12/20/2016 14:07   Ct Image Guided Fluid Drain By Catheter  Result Date: 12/08/2016 INDICATION: Perihepatic abscess EXAM: CT-GUIDED ABSCESS DRAIN MEDICATIONS: The patient is currently admitted to the hospital and receiving intravenous antibiotics. The antibiotics were  administered within an appropriate time frame prior to the initiation of the procedure. ANESTHESIA/SEDATION: Fentanyl 50 mcg IV; Versed 1 mg IV Moderate Sedation Time:  13 The patient was continuously monitored during the procedure by the interventional radiology nurse under my direct supervision. COMPLICATIONS: None immediate. PROCEDURE: Informed written consent was obtained from the patient after a thorough discussion of the procedural risks, benefits and alternatives. All questions were addressed. Maximal Sterile Barrier Technique was utilized including caps, mask, sterile gowns, sterile gloves, sterile drape, hand hygiene and skin antiseptic. A timeout was performed prior to the initiation of the procedure. The right upper quadrant was prepped and draped in a sterile fashion. 1% lidocaine was utilized for local infiltration. Under CT guidance, an 18 gauge needle was advanced into the perihepatic abscess and removed over an Amplatz wire. A 12 French dilator followed by a 12 [place] drain were inserted. It was looped and string fixed then sewn to the skin. Bilious fluid was aspirated. FINDINGS: Images demonstrate placement of a 63 French drain into a perihepatic abscess. IMPRESSION: Successful CT-guided perihepatic abscess drain yielding bilious fluid. Electronically Signed   By: Jolaine Click M.D.   On: 12/08/2016 15:07   Ir Radiologist Eval & Mgmt  Result Date: 01/03/2017 Please refer to notes tab for details about interventional procedure. (Op Note)  Ir Thoracentesis Asp Pleural Space W/img Guide  Result Date: 12/23/2016 INDICATION: History of cholecystectomy complicated by development of bile leak, now with enlarging symptomatic right-sided pleural effusion. Please from ultrasound-guided thoracentesis for therapeutic and diagnostic purposes. EXAM: IR THORACENTESIS ASP PLEURAL SPACE W/IMG GUIDE COMPARISON:  Chest CT - 12/23/2026 MEDICATIONS: None. COMPLICATIONS: None immediate. TECHNIQUE: Informed  written consent was obtained from the patient after a discussion of the risks, benefits and alternatives to treatment. A timeout was performed prior to the initiation of the procedure. Initial ultrasound scanning demonstrates a small to moderate-sized right-sided pleural effusion. The lower chest was prepped and draped in the usual sterile fashion. 1% lidocaine was used for local anesthesia. An ultrasound image was saved for documentation purposes. An 8 Fr Safe-T-Centesis catheter was introduced. The thoracentesis was performed. Note, despite there being a small amount of residual fluid, the procedure was terminated early due to patient's chest discomfort. The catheter  was removed and a dressing was applied. The patient tolerated the procedure well without immediate post procedural complication. The patient was escorted to have an upright chest radiograph. FINDINGS: A total of approximately 850 cc cc of serous fluid was removed. Requested samples were sent to the laboratory. IMPRESSION: Successful ultrasound-guided right sided thoracentesis yielding 800 cc of pleural fluid. Electronically Signed   By: Simonne Come M.D.   On: 12/23/2016 15:30    Labs:  CBC: Recent Labs    12/23/16 0214 12/24/16 0402 12/25/16 0852 12/30/16 1155  WBC 13.5* 10.3 8.6 10.5  HGB 9.3* 8.8* 10.0* 11.7*  HCT 30.6* 29.2* 32.2* 37.8*  PLT 451* 494* 544* 851*    COAGS: Recent Labs    12/08/16 0747 12/23/16 0214  INR 1.08 1.19    BMP: Recent Labs    12/23/16 0214 12/24/16 0402 12/25/16 0852 12/30/16 1155  NA 130* 133* 135 137  K 3.8 3.6 4.0 3.7  CL 99* 100* 103 102  CO2 24 26 25 26   GLUCOSE 112* 97 95 112*  BUN 9 6 5* 13  CALCIUM 7.4* 7.5* 8.2* 9.2  CREATININE 0.84 0.80 0.75 0.86  GFRNONAA >60 >60 >60 >60  GFRAA >60 >60 >60 >60    LIVER FUNCTION TESTS: Recent Labs    12/21/16 1841 12/24/16 0402 12/25/16 0852 12/30/16 1155  BILITOT 0.7 0.5 1.0 0.6  AST 19 15 32 60*  ALT 16* 12* 14* 93*  ALKPHOS  120 108 114 214*  PROT 6.4* 5.5* 6.4* 8.2*  ALBUMIN 2.3* 1.9* 2.1* 2.6*      Assessment and Plan:  Follow-up CT today demonstrates improvement in the perihepatic loculated biloma/abscess. There is also some improvement in the anterior abdominal sub hepatic abscess/hematoma. Drain catheters remain. Fluoroscopic injection of the pigtail drain demonstrates a persistent bile leak from the gallbladder fossa location to the cystic duct stump.  Plan: Keep the gallbladder fossa drain to gravity drainage since this collapse cavity communicates with the persistent bile leak from the cystic duct stump.  Surgical drain remains to JP bulb.  He has surgery follow-up scheduled tomorrow.  Outpatient drain clinic follow-up in 2 weeks with a repeat CT abdomen with contrast and fluoroscopic drain injection.  Electronically Signed: Berdine Dance 01/03/2017, 1:57 PM   I spent a total of    25 Minutes in face to face in clinical consultation, greater than 50% of which was counseling/coordinating care for this patient with postop bile leak status post drain

## 2017-01-17 ENCOUNTER — Ambulatory Visit
Admission: RE | Admit: 2017-01-17 | Discharge: 2017-01-17 | Disposition: A | Discharge status: Home / Self Care | Payer: No Typology Code available for payment source | Source: Ambulatory Visit | Attending: General Surgery | Admitting: General Surgery

## 2017-01-17 ENCOUNTER — Other Ambulatory Visit: Payer: No Typology Code available for payment source

## 2017-01-17 ENCOUNTER — Other Ambulatory Visit: Payer: Self-pay | Admitting: General Surgery

## 2017-01-17 DIAGNOSIS — K650 Generalized (acute) peritonitis: Secondary | ICD-10-CM

## 2017-01-17 HISTORY — PX: IR RADIOLOGIST EVAL & MGMT: IMG5224

## 2017-01-17 MED ORDER — IOPAMIDOL (ISOVUE-300) INJECTION 61%
100.0000 mL | Freq: Once | INTRAVENOUS | Status: AC | PRN
  Administered 2017-01-17: 100 mL via INTRAVENOUS

## 2017-01-17 NOTE — Progress Notes (Signed)
Patient ID: Bobby Kelly, male   DOB: 1980/04/14, 36 y.o.   MRN: [id number]    Referring Physician(s): Thompson,Burke  Chief Complaint: The patient is seen in follow up today s/p biloma drain placement  History of present illness:  This is a 36 year old male who underwent a laparoscopic cholecystectomy for gangrenous cholecystitis on December 03, 2016.  He subsequently developed a postoperative bile leak.  A biloma drain was placed by interventional radiology on December 08, 2016.  The patient underwent multiple attempts at ERCP for stent placement.  This was finally achieved.  Once this was achieved his output from his biloma drain good decrease in output.  However he did require repositioning of his biloma drain on December 23, 2016.  His hospitalization was prolonged secondary to multiple other issues.  He was subsequently discharged.  He returns today for follow-up of his drain.  He states about a week ago his biloma drain output change from bilious to a foul-smelling, red, brown thicker output.  He is draining anywhere from 50-100 cc a day.  He is flushing his catheter twice a day.  He did develop some new epigastric pain about a week ago.  He denies any fevers.  He presents today for evaluation.  Past Medical History:  Diagnosis Date  . Allergy   . CHF (congestive heart failure) (HCC)   . Cholelithiasis   . Dry skin    nasal folds  . HTN (hypertension)   . Hypertension   . Thyroid disease    hyperactive in high school, had oral therapy.    . Wears contact lenses     Past Surgical History:  Procedure Laterality Date  . APPENDECTOMY    . CHOLECYSTECTOMY N/A 12/03/2016   Procedure: LAPAROSCOPIC CHOLECYSTECTOMY;  Surgeon: Violeta Gelinas, MD;  Location[facility] OR;  Service: General;  Laterality: N/A;  . ERCP N/A 12/19/2016   Procedure: ENDOSCOPIC RETROGRADE CHOLANGIOPANCREATOGRAPHY (ERCP);  Surgeon: Vida Rigger, MD;  Location: [facility] ENDOSCOPY;  Service: Endoscopy;  Laterality: N/A;  .  ESOPHAGOGASTRODUODENOSCOPY (EGD) WITH PROPOFOL N/A 12/13/2016   Procedure: ESOPHAGOGASTRODUODENOSCOPY (EGD) WITH PROPOFOL;  Surgeon: Willis Modena, MD;  Location: [facility] ENDOSCOPY;  Service: Endoscopy;  Laterality: N/A;  . ESOPHAGOGASTRODUODENOSCOPY (EGD) WITH PROPOFOL N/A 12/15/2016   Procedure: ESOPHAGOGASTRODUODENOSCOPY (EGD) WITH PROPOFOL;  Surgeon: Willis Modena, MD;  Location: [facility] ENDOSCOPY;  Service: Endoscopy;  Laterality: N/A;  . IR CATHETER TUBE CHANGE  12/23/2016  . IR RADIOLOGIST EVAL & MGMT  01/03/2017  . IR THORACENTESIS ASP PLEURAL SPACE W/IMG GUIDE  12/23/2016    Allergies: Patient has no known allergies.  Medications: Prior to Admission medications   Medication Sig Start Date End Date Taking? Authorizing Provider  lisinopril (PRINIVIL,ZESTRIL) 20 MG tablet Take 1 tablet (20 mg total) by mouth daily. 10/17/16 01/15/17  Camnitz, Andree Coss, MD  metoprolol succinate (TOPROL-XL) 50 MG 24 hr tablet Take 1 tablet (50 mg total) by mouth daily. Take with or immediately following a meal. 10/17/16   Camnitz, Andree Coss, MD  ondansetron (ZOFRAN ODT) 4 MG disintegrating tablet Take 1 tablet (4 mg total) by mouth every 8 (eight) hours as needed for nausea or vomiting. 11/26/16   Khatri, Hina, PA-C  oxyCODONE (OXY IR/ROXICODONE) 5 MG immediate release tablet Take 1 tablet (5 mg total) every 4 (four) hours as needed by mouth for severe pain or breakthrough pain. 12/25/16   Rayburn, Alphonsus Sias, PA-C  oxyCODONE-acetaminophen (PERCOCET/ROXICET) 5-325 MG tablet Take 1 tablet by mouth every 6 (six) hours as needed. pain 11/26/16  [provider]  pravastatin (PRAVACHOL) 20 MG tablet Take 1 tablet (20 mg total) by mouth every evening. 11/07/16 11/07/17  Tysinger, Kermit Baloavid S, PA-C     Family History  Problem Relation Age of Onset  . Hypertension Mother   . Cataracts Mother   . Hyperlipidemia Father   . Pancreatic cancer Maternal Grandmother   . Cancer Maternal Grandmother   . Diabetes Other     . Heart disease Neg Hx   . Stroke Neg Hx     Social History   Socioeconomic History  . Marital status: Married    Spouse name: Not on file  . Number of children: Not on file  . Years of education: Not on file  . Highest education level: Not on file  Social Needs  . Financial resource strain: Not on file  . Food insecurity - worry: Not on file  . Food insecurity - inability: Not on file  . Transportation needs - medical: Not on file  . Transportation needs - non-medical: Not on file  Occupational History  . Not on file  Tobacco Use  . Smoking status: Never Smoker  . Smokeless tobacco: Never Used  Substance and Sexual Activity  . Alcohol use: Yes    Alcohol/week: 2.4 oz    Types: 3 Cans of beer, 1 Shots of liquor per week    Comment: occ, history of 2-3 years of heavier alcohol  . Drug use: No  . Sexual activity: Not on file  Other Topics Concern  . Not on file  Social History Narrative   Lives at home with wife and 1yo son. Son's birthday end of September.   Exercise - some jogging 2-3 times per week.    Considering joining gym.   10/2016     Vital Signs: BP 108/67 (BP Location: Left Arm, Patient Position: Sitting, Cuff Size: Normal)   Pulse 94   Temp 99.1 F (37.3 C)   Resp 18   SpO2 100%   Physical Exam General: Well-developed, well-nourished male in no acute distress Abdomen: Soft, minimally tender around his drains which is appropriate.  His biloma drain has foul-smelling, red-brown purulent appearing output.  His drain site is clean, dry, and intact.  His surgical drain has about 15-20 cc of serous output. Imaging: No results found.  Labs:  CBC: Recent Labs    12/23/16 0214 12/24/16 0402 12/25/16 0852 12/30/16 1155  WBC 13.5* 10.3 8.6 10.5  HGB 9.3* 8.8* 10.0* 11.7*  HCT 30.6* 29.2* 32.2* 37.8*  PLT 451* 494* 544* 851*    COAGS: Recent Labs    12/08/16 0747 12/23/16 0214  INR 1.08 1.19    BMP: Recent Labs    12/23/16 0214  12/24/16 0402 12/25/16 0852 12/30/16 1155  NA 130* 133* 135 137  K 3.8 3.6 4.0 3.7  CL 99* 100* 103 102  CO2 24 26 25 26   GLUCOSE 112* 97 95 112*  BUN 9 6 5* 13  CALCIUM 7.4* 7.5* 8.2* 9.2  CREATININE 0.84 0.80 0.75 0.86  GFRNONAA >60 >60 >60 >60  GFRAA >60 >60 >60 >60    LIVER FUNCTION TESTS: Recent Labs    12/21/16 1841 12/24/16 0402 12/25/16 0852 12/30/16 1155  BILITOT 0.7 0.5 1.0 0.6  AST 19 15 32 60*  ALT 16* 12* 14* 93*  ALKPHOS 120 108 114 214*  PROT 6.4* 5.5* 6.4* 8.2*  ALBUMIN 2.3* 1.9* 2.1* 2.6*    Assessment:  1.  Gangrenous cholecystitis status post laparoscopic cholecystectomy  with subsequent development of biloma and drain placement.  The patient's CT scan today shows persistent fluid collection within the abdomen where his drain is placed.  It is in good position.  His output however, does appear to have changed from bilious to more purulent appearing drainage.  He sees Dr. Janee Morn tomorrow and we will defer to him if he feels like the patient needs further antibiotic therapy.  His drain injection today does reveal a persistent cystic duct bile leak that is connecting to his drain.  Therefore, his drain cannot be removed at this time.  He will continue to flush his drain at home twice a day.  He will return to drain clinic in 2 weeks with repeat fluoroscopic injection, but no CT scan.  The patient understands all of this and all questions are answered.  Signed: Letha Cape, PA-C 01/17/2017, 2:09 PM   Please refer to Dr. Grace Isaac' attestation of this note for management and plan.

## 2017-01-25 ENCOUNTER — Encounter: Payer: Self-pay | Admitting: Radiology

## 2017-01-25 ENCOUNTER — Ambulatory Visit
Admission: RE | Admit: 2017-01-25 | Discharge: 2017-01-25 | Disposition: A | Discharge status: Home / Self Care | Payer: No Typology Code available for payment source | Source: Ambulatory Visit | Attending: General Surgery | Admitting: General Surgery

## 2017-01-25 ENCOUNTER — Other Ambulatory Visit: Payer: Self-pay | Admitting: General Surgery

## 2017-01-25 DIAGNOSIS — K650 Generalized (acute) peritonitis: Secondary | ICD-10-CM

## 2017-01-25 HISTORY — PX: IR RADIOLOGIST EVAL & MGMT: IMG5224

## 2017-01-25 NOTE — Progress Notes (Signed)
Referring Physician(s): Thompson,Burke  Chief Complaint: The patient is seen in follow up today s/p Successful CT-guided perihepatic abscess drain yielding bilious Fluid 12/08/2016  History of present illness:   History of gangrenous cholecystitis with postoperative bile leak, post CT-guided percutaneous drainage catheter placement on 12/08/2016 and fluoroscopic guided drainage catheter exchange and repositioning on 01/03/2017.  01/17/17: Persistent communication between the slightly decompressed biloma and the residual cystic duct with passage of contrast to the level of the CBD and biliary stent.  Now scheduled for follow up eval and drain injection Pt still on antibiotics Flushing drain daily with 5 cc sterile saline OP is 20-25 cc daily Decreasing all the time Denies fever/chills Denies pain; N/V To see Dr Elwyn LadeB Thompson Feb 15, 2017   Past Medical History:  Diagnosis Date  . Allergy   . CHF (congestive heart failure) (HCC)   . Cholelithiasis   . Dry skin    nasal folds  . HTN (hypertension)   . Hypertension   . Thyroid disease    hyperactive in high school, had oral therapy.    . Wears contact lenses     Past Surgical History:  Procedure Laterality Date  . APPENDECTOMY    . CHOLECYSTECTOMY N/A 12/03/2016   Procedure: LAPAROSCOPIC CHOLECYSTECTOMY;  Surgeon: Violeta Gelinashompson, Burke, MD;  [facility];  Service: General;  Laterality: N/A;  . ERCP N/A 12/19/2016   Procedure: ENDOSCOPIC RETROGRADE CHOLANGIOPANCREATOGRAPHY (ERCP);  Surgeon: Vida RiggerMagod, Marc, MD;  [facility];  Service: Endoscopy;  Laterality: N/A;  . ESOPHAGOGASTRODUODENOSCOPY (EGD) WITH PROPOFOL N/A 12/13/2016   Procedure: ESOPHAGOGASTRODUODENOSCOPY (EGD) WITH PROPOFOL;  Surgeon: Willis Modenautlaw, William, MD;  [facility];  Service: Endoscopy;  Laterality: N/A;  . ESOPHAGOGASTRODUODENOSCOPY (EGD) WITH PROPOFOL N/A 12/15/2016   Procedure: ESOPHAGOGASTRODUODENOSCOPY (EGD) WITH PROPOFOL;  Surgeon:  Willis Modenautlaw, William, MD;  [facility];  Service: Endoscopy;  Laterality: N/A;  . IR CATHETER TUBE CHANGE  12/23/2016  . IR RADIOLOGIST EVAL & MGMT  01/03/2017  . IR RADIOLOGIST EVAL & MGMT  01/17/2017  . IR RADIOLOGIST EVAL & MGMT  01/25/2017  . IR THORACENTESIS ASP PLEURAL SPACE W/IMG GUIDE  12/23/2016    Allergies: Patient has no known allergies.  Medications: Prior to Admission medications   Medication Sig Start Date End Date Taking? Authorizing Provider  lisinopril (PRINIVIL,ZESTRIL) 20 MG tablet Take 1 tablet (20 mg total) by mouth daily. 10/17/16 01/15/17  Camnitz, Andree CossWill Martin, MD  metoprolol succinate (TOPROL-XL) 50 MG 24 hr tablet Take 1 tablet (50 mg total) by mouth daily. Take with or immediately following a meal. 10/17/16   Camnitz, Andree CossWill Martin, MD  ondansetron (ZOFRAN ODT) 4 MG disintegrating tablet Take 1 tablet (4 mg total) by mouth every 8 (eight) hours as needed for nausea or vomiting. 11/26/16   Khatri, Hina, PA-C  oxyCODONE (OXY IR/ROXICODONE) 5 MG immediate release tablet Take 1 tablet (5 mg total) every 4 (four) hours as needed by mouth for severe pain or breakthrough pain. 12/25/16   Rayburn, Alphonsus SiasKelly A, PA-C  oxyCODONE-acetaminophen (PERCOCET/ROXICET) 5-325 MG tablet Take 1 tablet by mouth every 6 (six) hours as needed. pain 11/26/16   [provider]  pravastatin (PRAVACHOL) 20 MG tablet Take 1 tablet (20 mg total) by mouth every evening. 11/07/16 11/07/17  Tysinger, Kermit Baloavid S, PA-C     Family History  Problem Relation Age of Onset  . Hypertension Mother   . Cataracts Mother   . Hyperlipidemia Father   . Pancreatic cancer Maternal Grandmother   . Cancer Maternal  Grandmother   . Diabetes Other   . Heart disease Neg Hx   . Stroke Neg Hx     Social History   Socioeconomic History  . Marital status: Married    Spouse name: Not on file  . Number of children: Not on file  . Years of education: Not on file  . Highest education level: Not on file  Social  Needs  . Financial resource strain: Not on file  . Food insecurity - worry: Not on file  . Food insecurity - inability: Not on file  . Transportation needs - medical: Not on file  . Transportation needs - non-medical: Not on file  Occupational History  . Not on file  Tobacco Use  . Smoking status: Never Smoker  . Smokeless tobacco: Never Used  Substance and Sexual Activity  . Alcohol use: Yes    Alcohol/week: 2.4 oz    Types: 3 Cans of beer, 1 Shots of liquor per week    Comment: occ, history of 2-3 years of heavier alcohol  . Drug use: No  . Sexual activity: Not on file  Other Topics Concern  . Not on file  Social History Narrative   Lives at home with wife and 1yo son. Son's birthday end of September.   Exercise - some jogging 2-3 times per week.    Considering joining gym.   10/2016     Vital Signs: BP 110/69 (BP Location: Left Arm)   Pulse 98   Temp 98.4 F (36.9 C)   SpO2 99%   Physical Exam  Constitutional: He is oriented to person, place, and time.  Abdominal: Soft.  Musculoskeletal: Normal range of motion.  Neurological: He is alert and oriented to person, place, and time.  Skin: Skin is warm and dry.  Site is clean and dry NT  no bleeding Drain injection by Dr Fredia Sorrow Shows: NO fistula today Does reveal small collection remains at tip of drain catheter  Nursing note and vitals reviewed.   Imaging: Ir Radiologist Eval & Mgmt  Result Date: 01/25/2017 Please refer to notes tab for details about interventional procedure. (Op Note)   Labs:  CBC: Recent Labs    12/23/16 0214 12/24/16 0402 12/25/16 0852 12/30/16 1155  WBC 13.5* 10.3 8.6 10.5  HGB 9.3* 8.8* 10.0* 11.7*  HCT 30.6* 29.2* 32.2* 37.8*  PLT 451* 494* 544* 851*    COAGS: Recent Labs    12/08/16 0747 12/23/16 0214  INR 1.08 1.19    BMP: Recent Labs    12/23/16 0214 12/24/16 0402 12/25/16 0852 12/30/16 1155  NA 130* 133* 135 137  K 3.8 3.6 4.0 3.7  CL 99* 100* 103 102    CO2 24 26 25 26   GLUCOSE 112* 97 95 112*  BUN 9 6 5* 13  CALCIUM 7.4* 7.5* 8.2* 9.2  CREATININE 0.84 0.80 0.75 0.86  GFRNONAA >60 >60 >60 >60  GFRAA >60 >60 >60 >60    LIVER FUNCTION TESTS: Recent Labs    12/21/16 1841 12/24/16 0402 12/25/16 0852 12/30/16 1155  BILITOT 0.7 0.5 1.0 0.6  AST 19 15 32 60*  ALT 16* 12* 14* 93*  ALKPHOS 120 108 114 214*  PROT 6.4* 5.5* 6.4* 8.2*  ALBUMIN 2.3* 1.9* 2.1* 2.6*    Assessment:  Perihepatic abscess drain NO fistula as per injection today Small collection remains at drain tip Pt is to continue to flush only 5 cc daily; record output diligently Return to IR Clinic Jan 3rd for evaluation  and probable drain removal Keep Jan 9 appt with Dr Janee Morn; finish antibiotics  Signed: Robet Leu, PA-C 01/25/2017, 2:02 PM   Please refer to Dr. Fredia Sorrow attestation of this note for management and plan.

## 2017-02-09 ENCOUNTER — Ambulatory Visit
Admission: RE | Admit: 2017-02-09 | Discharge: 2017-02-09 | Disposition: A | Discharge status: Home / Self Care | Payer: No Typology Code available for payment source | Source: Ambulatory Visit | Attending: General Surgery | Admitting: General Surgery

## 2017-02-09 ENCOUNTER — Encounter: Payer: Self-pay | Admitting: *Deleted

## 2017-02-09 DIAGNOSIS — K650 Generalized (acute) peritonitis: Secondary | ICD-10-CM

## 2017-02-09 HISTORY — PX: IR RADIOLOGIST EVAL & MGMT: IMG5224

## 2017-02-09 NOTE — Progress Notes (Signed)
Patient ID: Bobby Kelly, male   DOB: 06/23/1980, 37 y.o.   MRN: [id number]         Chief Complaint: Patient was seen in consultation today for  Chief Complaint  Patient presents with  . Follow-up    follow up Drain    Referring Physician(s): Thompson,Burke  History of Present Illness: Bobby Kelly is a 37 y.o. male with past medical history significant for gangrenous cholecystitis, located by a post operative bile leak, post CT-guided percutaneous drainage catheter placement on 12/08/2016 and fluoroscopic guided drainage catheter exchange and repositioning on 01/03/2017.  The patient returns to the interventional radiology drain clinic for drainage catheter evaluation and management.  The patient continues to flush the percutaneous drainage catheter.  The patient continues to do an excellent job diligent records regarding drainage catheter output.  Patient reports expected drainage catheter related abdominal discomfort though is otherwise without complaint. Specifically, no fevers or chills. Patient is tolerating diet well.  The patient is NOT currently taking antibiotics.    Past Medical History:  Diagnosis Date  . Allergy   . CHF (congestive heart failure) (HCC)   . Cholelithiasis   . Dry skin    nasal folds  . HTN (hypertension)   . Hypertension   . Thyroid disease    hyperactive in high school, had oral therapy.    . Wears contact lenses     Past Surgical History:  Procedure Laterality Date  . APPENDECTOMY    . CHOLECYSTECTOMY N/A 12/03/2016   Procedure: LAPAROSCOPIC CHOLECYSTECTOMY;  Surgeon: Violeta Gelinas, MD;  Location: [facility] OR;  Service: General;  Laterality: N/A;  . ERCP N/A 12/19/2016   Procedure: ENDOSCOPIC RETROGRADE CHOLANGIOPANCREATOGRAPHY (ERCP);  Surgeon: Vida Rigger, MD;  [facility] ENDOSCOPY;  Service: Endoscopy;  Laterality: N/A;  . ESOPHAGOGASTRODUODENOSCOPY (EGD) WITH PROPOFOL N/A 12/13/2016   Procedure: ESOPHAGOGASTRODUODENOSCOPY (EGD) WITH  PROPOFOL;  Surgeon: Willis Modena, MD;  Location: [facility] ENDOSCOPY;  Service: Endoscopy;  Laterality: N/A;  . ESOPHAGOGASTRODUODENOSCOPY (EGD) WITH PROPOFOL N/A 12/15/2016   Procedure: ESOPHAGOGASTRODUODENOSCOPY (EGD) WITH PROPOFOL;  Surgeon: Willis Modena, MD;  Location: [facility] ENDOSCOPY;  Service: Endoscopy;  Laterality: N/A;  . IR CATHETER TUBE CHANGE  12/23/2016  . IR RADIOLOGIST EVAL & MGMT  01/03/2017  . IR RADIOLOGIST EVAL & MGMT  01/17/2017  . IR RADIOLOGIST EVAL & MGMT  01/25/2017  . IR THORACENTESIS ASP PLEURAL SPACE W/IMG GUIDE  12/23/2016    Allergies: Patient has no known allergies.  Medications: Prior to Admission medications   Medication Sig Start Date End Date Taking? Authorizing Provider  metoprolol succinate (TOPROL-XL) 50 MG 24 hr tablet Take 1 tablet (50 mg total) by mouth daily. Take with or immediately following a meal. 10/17/16  Yes Camnitz, Andree Coss, MD  oxyCODONE-acetaminophen (PERCOCET/ROXICET) 5-325 MG tablet Take 1 tablet by mouth every 6 (six) hours as needed. pain 11/26/16  Yes [provider]  lisinopril (PRINIVIL,ZESTRIL) 20 MG tablet Take 1 tablet (20 mg total) by mouth daily. 10/17/16 01/15/17  Camnitz, Will Daphine Deutscher, MD  ondansetron (ZOFRAN ODT) 4 MG disintegrating tablet Take 1 tablet (4 mg total) by mouth every 8 (eight) hours as needed for nausea or vomiting. Patient not taking: Reported on 02/09/2017 11/26/16   Dietrich Pates, PA-C  oxyCODONE (OXY IR/ROXICODONE) 5 MG immediate release tablet Take 1 tablet (5 mg total) every 4 (four) hours as needed by mouth for severe pain or breakthrough pain. Patient not taking: Reported on 02/09/2017 12/25/16   Rayburn, Tresa Endo A, PA-C  pravastatin (PRAVACHOL) 20 MG  tablet Take 1 tablet (20 mg total) by mouth every evening. Patient not taking: Reported on 02/09/2017 11/07/16 11/07/17  Tysinger, Kermit Balo, PA-C     Family History  Problem Relation Age of Onset  . Hypertension Mother   . Cataracts Mother   . Hyperlipidemia  Father   . Pancreatic cancer Maternal Grandmother   . Cancer Maternal Grandmother   . Diabetes Other   . Heart disease Neg Hx   . Stroke Neg Hx     Social History   Socioeconomic History  . Marital status: Married    Spouse name: Not on file  . Number of children: Not on file  . Years of education: Not on file  . Highest education level: Not on file  Social Needs  . Financial resource strain: Not on file  . Food insecurity - worry: Not on file  . Food insecurity - inability: Not on file  . Transportation needs - medical: Not on file  . Transportation needs - non-medical: Not on file  Occupational History  . Not on file  Tobacco Use  . Smoking status: Never Smoker  . Smokeless tobacco: Never Used  Substance and Sexual Activity  . Alcohol use: Yes    Alcohol/week: 2.4 oz    Types: 3 Cans of beer, 1 Shots of liquor per week    Comment: occ, history of 2-3 years of heavier alcohol  . Drug use: No  . Sexual activity: Not on file  Other Topics Concern  . Not on file  Social History Narrative   Lives at home with wife and 1yo son. Son's birthday end of September.   Exercise - some jogging 2-3 times per week.    Considering joining gym.   10/2016    ECOG Status: 1 - Symptomatic but completely ambulatory  Review of Systems: A 12 point ROS discussed and pertinent positives are indicated in the HPI above.  All other systems are negative.  Review of Systems  Constitutional: Negative for activity change, appetite change, fatigue and fever.  Gastrointestinal:       Minimal amount of expected catheter-related abdominal pain.    Vital Signs: BP 114/66   Pulse (!) 54   Temp 98.2 F (36.8 C) (Oral)   Resp 14   Ht 5\' 5"  (1.651 m)   Wt 180 lb (81.6 kg)   SpO2 100%   BMI 29.95 kg/m   Physical Exam  Abdominal:    Red - IR drain Blue - Surgical drain      Imaging:  Ct Abdomen W Contrast  Result Date: 01/17/2017 CLINICAL DATA:  History of gangrenous cholecystitis  with postoperative bile leak, post CT-guided percutaneous drainage catheter placement on 12/08/2016 and fluoroscopic guided drainage catheter exchange and repositioning on 01/03/2017. Patient returns to the Interventional Radiology drain Clinic for drainage catheter evaluation and management. Patient continues to flush the percutaneous drainage catheter twice a day. The patient reports approximately 50 cc of bilious, foul smelling fluid from the drainage catheter/day. EXAM: CT ABDOMEN WITH CONTRAST TECHNIQUE: Multidetector CT imaging of the abdomen was performed using the standard protocol following bolus administration of intravenous contrast. CONTRAST:  ISOVUE-300 IOPAMIDOL (ISOVUE-300) INJECTION 61% COMPARISON:  CT abdomen and pelvis - 01/03/2017 ; 12/20/2016 ; 12/07/2016; CT-guided percutaneous drainage catheter placement - 12/08/2016; fluoroscopic guided drainage catheter exchange and repositioning - 12/23/2016; drainage catheter injection - 01/03/2017 FINDINGS: Lower chest: Limited visualization of the lower thorax demonstrates unchanged small right-sided pleural effusion and associated bibasilar subsegmental atelectasis/collapse, right  greater than left. Normal heart size.  No pericardial effusion. Hepatobiliary: Normal hepatic contour.  No discrete hepatic lesions. Unchanged positioning of percutaneous drainage catheter with end coiled and locked at the peripheral aspect of the gallbladder fossa. Interval decrease in size of residual mixed air in fluid collection caudal to the percutaneous drainage catheter, currently measuring 9.7 x 3.0 x 4.6 cm (as measured in greatest oblique short axis axial - image 35, series 3 and coronal - image 27, series 601 dimensions), previously, 12.2 x 3.6 x 5.8 cm. There is a minimal amount of air in fluid about the central aspect of the gallbladder fossa, unchanged. An internal biliary stent is seen within the caudal aspect of the common bile duct with associated minimal  amount of pneumobilia within the nondependent portion the left lobe of the liver. There is an undrained approximately 4.8 x 2.9 cm collection about the subcapsular aspect the right lobe of the liver (image 39, series 3), previously, 6.0 x 1.9 cm. Again, there is no fluid seen along the course of these surgical drainage catheter. No new definable/drainable perihepatic or intrahepatic fluid collections. Pancreas: Normal appearance of the pancreas Spleen: Normal appearance of the spleen. Adrenals/Urinary Tract: There is symmetric enhancement of the bilateral kidneys. No renal stones this postcontrast examination. No discrete renal lesions. No urine obstruction or perinephric stranding. Normal appearance the bilateral adrenal glands. Normal appearance of the urinary bladder given degree distention. Stomach/Bowel: No evidence of enteric obstruction. No pneumoperitoneum, pneumatosis or portal venous gas. Vascular/Lymphatic: Normal caliber the abdominal aorta. The major branch vessels of the abdominal aorta appear patent on this non CTA examination. No bulky retroperitoneal, mesenteric, pelvic or inguinal lymphadenopathy. Other: Normal appearance of the pelvic organs. No free fluid the pelvic cul-de-sac. Musculoskeletal: No acute or aggressive osseous abnormalities. Stigmata of DISH with the caudal aspect of the thoracic spine. IMPRESSION: 1. No new or enlarging peri or intrahepatic fluid collections. 2. Continued decrease in size of perihepatic mixed air and fluid collection, currently measuring 9.7 cm in diameter, previously, 12.2 cm. 3. Slight decrease in size of undrained collection about the subcapsular aspect of the right lobe of the liver, currently measuring 4.8 cm, previously, 6.0 cm. 4. Unchanged small right-sided pleural effusion and associated bibasilar atelectasis/collapse. PLAN: - Patient subsequently underwent percutaneous drainage catheter injection. Electronically Signed   By: Simonne Come M.D.   On:  01/17/2017 14:20   Dg Sinus/fist Tube Chk-non Gi  Result Date: 01/25/2017 INDICATION: History of gangrenous cholecystitis with bile leak after cholecystectomy. Status post percutaneous drainage of extrahepatic biloma on 12/08/2016 and biliary stenting for bile leak. Decreasing bile output from the drain. EXAM: INJECTION OF INDWELLING ABSCESS DRAINAGE CATHETER UNDER FLUOROSCOPY MEDICATIONS: None ANESTHESIA/SEDATION: None CONTRAST:  25 mL Omnipaque 300 FLUOROSCOPY TIME:  36 seconds.  26 mGy. COMPLICATIONS: None immediate. PROCEDURE: Contrast was injected through the pre-existing drainage catheter. Multiple fluoroscopic spot images were obtained. Aspiration was then performed through the drain and the drain reconnected to a new gravity drainage bag. FINDINGS: With injection of the drainage catheter, there is initial opacification of an irregular cavity medial to the drainage catheter followed by a thin connection to an additional smaller cavity lateral to the right lobe of the liver. No further fistula to the biliary tree is demonstrated. Due to some persistent drainage from the catheter, the drain will be left to gravity drainage until output is diminished enough to remove the drain. IMPRESSION: No further fistula is demonstrated to the biliary tree with injection of the  percutaneous biloma drain. Due to some persistent fluid output from the drain, the drain will be left in place to gravity drainage at this time. Electronically Signed   By: Irish Lack M.D.   On: 01/25/2017 14:47   Dg Sinus/fist Tube Chk-non Gi  Result Date: 01/17/2017 CLINICAL DATA:  History of gangrenous cholecystitis with postoperative bile leak, post CT-guided percutaneous drainage catheter placement on 12/08/2016 and fluoroscopic guided drainage catheter exchange and repositioning on 01/03/2017. Patient returns to the Interventional Radiology drain Clinic for drainage catheter evaluation and management The patient continues to flush  the percutaneous drainage catheter twice a day. The patient reports approximately 50 cc of bilious, foul smelling fluid the drainage catheter per day. EXAM: ABSCESS INJECTION COMPARISON:  CT the abdomen pelvis - earlier same day; fluoroscopic guided drainage catheter injection - 01/03/2017 CONTRAST:  10 cc Isovue 300-administered via the existing drainage catheter FLUOROSCOPY TIME:  24 seconds (23 mGy) TECHNIQUE: The patient was positioned supine on the fluoroscopy table. A preprocedural spot fluoroscopic image was obtained of the right upper abdominal and existing percutaneous drainage catheter Multiple spot fluoroscopic and radiographic images were obtained following the injection of a small amount of contrast via the existing percutaneous drainage catheter. Images reviewed in the procedure was terminated. The drainage catheter was flushed with a small amount of saline and reconnected to a gravity bag. The patient tolerated the procedure well without immediate postprocedural complication. FINDINGS: Preprocedural spot fluoroscopic image demonstrates under change positioning of percutaneous drainage catheter with and coiled and locked over the expected location of the gallbladder fossa. Additional surgical drainage catheter overlies the right upper abdominal quadrant. An internal biliary stent overlies expected location of the common bile duct. Excreted contrast is seen within the right renal collecting system. Post cholecystectomy. Contrast injection of the right upper quadrant percutaneous drainage catheter demonstrates opacification of the residual air and fluid collection about the anterior caudal aspects of the liver. There is communication between the decompressed presumed biloma and the residual cystic duct with opacification of the common bile duct, biliary stent and duodenum. IMPRESSION: Persistent communication between the slightly decompressed biloma and the residual cystic duct with passage of contrast  to the level of the CBD and biliary stent. Electronically Signed   By: Simonne Come M.D.   On: 01/17/2017 16:29   Ir Radiologist Eval & Mgmt  Result Date: 01/25/2017 Please refer to notes tab for details about interventional procedure. (Op Note)  Ir Radiologist Eval & Mgmt  Result Date: 01/17/2017 Please refer to notes tab for details about interventional procedure. (Op Note)   Labs:  CBC: Recent Labs    12/23/16 0214 12/24/16 0402 12/25/16 0852 12/30/16 1155  WBC 13.5* 10.3 8.6 10.5  HGB 9.3* 8.8* 10.0* 11.7*  HCT 30.6* 29.2* 32.2* 37.8*  PLT 451* 494* 544* 851*    COAGS: Recent Labs    12/08/16 0747 12/23/16 0214  INR 1.08 1.19    BMP: Recent Labs    12/23/16 0214 12/24/16 0402 12/25/16 0852 12/30/16 1155  NA 130* 133* 135 137  K 3.8 3.6 4.0 3.7  CL 99* 100* 103 102  CO2 24 26 25 26   GLUCOSE 112* 97 95 112*  BUN 9 6 5* 13  CALCIUM 7.4* 7.5* 8.2* 9.2  CREATININE 0.84 0.80 0.75 0.86  GFRNONAA >60 >60 >60 >60  GFRAA >60 >60 >60 >60    LIVER FUNCTION TESTS: Recent Labs    12/21/16 1841 12/24/16 0402 12/25/16 0852 12/30/16 1155  BILITOT 0.7 0.5  1.0 0.6  AST 19 15 32 60*  ALT 16* 12* 14* 93*  ALKPHOS 120 108 114 214*  PROT 6.4* 5.5* 6.4* 8.2*  ALBUMIN 2.3* 1.9* 2.1* 2.6*    TUMOR MARKERS: No results for input(s): AFPTM, CEA, CA199, CHROMGRNA in the last 8760 hours.  Assessment and Plan:  Katsumi Wisler is a 37 y.o. male with past medical history significant for gangrenous cholecystitis, located by a post operative bile leak, post CT-guided percutaneous drainage catheter placement on 12/08/2016 and fluoroscopic guided drainage catheter exchange and repositioning on 01/03/2017.  The patient continues to do an excellent job diligent records regarding drainage catheter output, however unfortunately there continues to be relatively high daily volumes from the drain of 20 - 70 cc.    While there is only a scant amount of serous fluid within the surgical  drain, there is thick bilious fluid within the IR drain.  In review of the patient's most recent abdominal CT performed 01/18/2015, I am concerned that there may be a residual un-drained fluid collections within the abdomen to explain the patient's persistent drainage catheter output.  While I appreciate the desire to limit radiation exposure in this young patient, I am concerned that premature drainage catheter removal will be of dis-service to this patient who has all ready experienced a long and difficult postoperative course.  As such, I will arrange for the patient to undergo dedicated CT scan of the abdomen on Monday, January 7 at [facility]. The patient will be NPO and go through [facility] short stay unit as I think there is a relatively high likelihood I will have either again manipulate his existing drain or place a new drainage catheter.   The patient demonstrated excellent understanding of discussion and his agreement with the proposed plan of care.  Patient was encouraged to call the interventional radiology clinic with any interval questions or concerns.  A copy of this report was sent to the requesting provider on this date.  Electronically Signed: Simonne Come 02/09/2017, 4:44 PM   I spent a total of 15 Minutes in face to face in clinical consultation, greater than 50% of which was counseling/coordinating care for drainage catheter evaluation and management.

## 2017-02-10 ENCOUNTER — Other Ambulatory Visit: Payer: Self-pay | Admitting: Radiology

## 2017-02-10 ENCOUNTER — Other Ambulatory Visit (HOSPITAL_COMMUNITY): Payer: Self-pay | Admitting: Interventional Radiology

## 2017-02-10 DIAGNOSIS — K810 Acute cholecystitis: Secondary | ICD-10-CM

## 2017-02-13 ENCOUNTER — Ambulatory Visit (HOSPITAL_COMMUNITY)
Admission: RE | Admit: 2017-02-13 | Discharge: 2017-02-13 | Disposition: A | Discharge status: Home / Self Care | Payer: No Typology Code available for payment source | Source: Ambulatory Visit | Attending: Interventional Radiology | Admitting: Interventional Radiology

## 2017-02-13 ENCOUNTER — Encounter (HOSPITAL_COMMUNITY): Payer: Self-pay

## 2017-02-13 DIAGNOSIS — R918 Other nonspecific abnormal finding of lung field: Secondary | ICD-10-CM | POA: Insufficient documentation

## 2017-02-13 DIAGNOSIS — J90 Pleural effusion, not elsewhere classified: Secondary | ICD-10-CM | POA: Diagnosis not present

## 2017-02-13 DIAGNOSIS — K810 Acute cholecystitis: Secondary | ICD-10-CM | POA: Diagnosis not present

## 2017-02-13 DIAGNOSIS — Z9889 Other specified postprocedural states: Secondary | ICD-10-CM | POA: Insufficient documentation

## 2017-02-13 MED ORDER — IOPAMIDOL (ISOVUE-300) INJECTION 61%
INTRAVENOUS | Status: AC
  Filled 2017-02-13: qty 100

## 2017-02-13 MED ORDER — IOPAMIDOL (ISOVUE-300) INJECTION 61%
100.0000 mL | Freq: Once | INTRAVENOUS | Status: AC | PRN
  Administered 2017-02-13: 80 mL via INTRAVENOUS

## 2017-02-13 MED ORDER — SODIUM CHLORIDE 0.9 % IV SOLN: INTRAVENOUS | Status: DC

## 2017-02-13 NOTE — Progress Notes (Signed)
Pt procedure canceled. Dr. Grace Isaac at bedside. Gives orders to discontinue blood draws. Pt may be discharged after orders are placed.

## 2017-02-13 NOTE — Progress Notes (Signed)
Pt discharge with papers. NAD noted. Pt was ambulatory and left with his mother. VSS. Denies any pain.

## 2017-02-14 ENCOUNTER — Other Ambulatory Visit: Payer: Self-pay | Admitting: Gastroenterology

## 2017-02-17 ENCOUNTER — Ambulatory Visit: Admit: 2017-02-17 | Payer: No Typology Code available for payment source | Admitting: Gastroenterology

## 2017-02-17 SURGERY — ERCP, WITH INTERVENTION IF INDICATED
Anesthesia: Monitor Anesthesia Care

## 2017-02-20 ENCOUNTER — Other Ambulatory Visit: Payer: Self-pay | Admitting: Gastroenterology

## 2017-02-20 DIAGNOSIS — K819 Cholecystitis, unspecified: Secondary | ICD-10-CM

## 2017-02-21 ENCOUNTER — Encounter: Payer: Self-pay | Admitting: Radiology

## 2017-02-21 ENCOUNTER — Ambulatory Visit
Admission: RE | Admit: 2017-02-21 | Discharge: 2017-02-21 | Disposition: A | Discharge status: Home / Self Care | Payer: No Typology Code available for payment source | Source: Ambulatory Visit | Attending: Gastroenterology | Admitting: Gastroenterology

## 2017-02-21 DIAGNOSIS — K819 Cholecystitis, unspecified: Secondary | ICD-10-CM

## 2017-02-21 HISTORY — PX: IR RADIOLOGIST EVAL & MGMT: IMG5224

## 2017-02-21 NOTE — Progress Notes (Signed)
Patient ID: Bobby Kelly, male   DOB: January 31, 1981, 37 y.o.   MRN: [id number] I reviewed the history and physical examination and have formulated the assessment and plan in the presence of the patient.  Assessment and Plan:  Hepatic abscess resolved. Negative for fistula. Minimal output. Drain catheter can be removed.  A copy of this report was sent to the requesting provider on this date.  Electronically Signed: Berdine Dance 02/21/2017, 3:07 PM   I spent a total of 10 Minutes in face to face in clinical consultation, greater than 50% of which was counseling/coordinating care for this patient with an hepatic abscess drain.

## 2017-02-21 NOTE — Progress Notes (Signed)
Referring Physician(s): Magod,Marc  Chief Complaint: The patient is seen in follow up today s/p 12/08/2016: Successful CT-guided perihepatic abscess drain  History of present illness:  History of gangrenous cholecystitis with postoperative bile leak, post CT-guided percutaneous drainage catheter placement on 12/08/2016 and fluoroscopic guided drainage catheter exchange and repositioning on 01/03/2017.  Surgical drain was removed 02/09/17 with Dr Grace Isaac IR drain was still with significant OP so CT was scheduled for 02/13/17  CT 1/7: IMPRESSION: 1. Unchanged positioning of percutaneous drainage catheter with end coiled and locked within the peripheral aspect of the gallbladder fossa. 2. Interval reduction in size of residual intra- abdominal now thick wall fluid collections. No new definable/drainable fluid collections within the abdomen. 3. Internal biliary stent appears suboptimally positioned with tip terminating caudal to the origin of the residual cystic duct. Pneumobilia is again seen within the origin of the residual cystic duct, extending along the gallbladder fossa about the medial aspect of the percutaneous drainage catheter. 4. Grossly unchanged subsegmental atelectasis/collapse of the right lower lobe and associated small right-sided pleural effusion.  OP has slowed to less than 10 cc daily Afeb; no fever/chills Drain catheter injection today shows no communication per Dr Miles Costain   Past Medical History:  Diagnosis Date  . Allergy   . CHF (congestive heart failure) (HCC)   . Cholelithiasis   . Dry skin    nasal folds  . HTN (hypertension)   . Hypertension   . Thyroid disease    hyperactive in high school, had oral therapy.    . Wears contact lenses     Past Surgical History:  Procedure Laterality Date  . APPENDECTOMY    . CHOLECYSTECTOMY N/A 12/03/2016   Procedure: LAPAROSCOPIC CHOLECYSTECTOMY;  Surgeon: Violeta Gelinas, MD;  Location: [facility] OR;  Service:  General;  Laterality: N/A;  . ERCP N/A 12/19/2016   Procedure: ENDOSCOPIC RETROGRADE CHOLANGIOPANCREATOGRAPHY (ERCP);  Surgeon: Vida Rigger, MD;  Location: [facility] ENDOSCOPY;  Service: Endoscopy;  Laterality: N/A;  . ESOPHAGOGASTRODUODENOSCOPY (EGD) WITH PROPOFOL N/A 12/13/2016   Procedure: ESOPHAGOGASTRODUODENOSCOPY (EGD) WITH PROPOFOL;  Surgeon: Willis Modena, MD;  Location: [facility] ENDOSCOPY;  Service: Endoscopy;  Laterality: N/A;  . ESOPHAGOGASTRODUODENOSCOPY (EGD) WITH PROPOFOL N/A 12/15/2016   Procedure: ESOPHAGOGASTRODUODENOSCOPY (EGD) WITH PROPOFOL;  Surgeon: Willis Modena, MD;  Location: [facility] ENDOSCOPY;  Service: Endoscopy;  Laterality: N/A;  . IR CATHETER TUBE CHANGE  12/23/2016  . IR RADIOLOGIST EVAL & MGMT  01/03/2017  . IR RADIOLOGIST EVAL & MGMT  01/17/2017  . IR RADIOLOGIST EVAL & MGMT  01/25/2017  . IR RADIOLOGIST EVAL & MGMT  02/09/2017  . IR RADIOLOGIST EVAL & MGMT  02/21/2017  . IR THORACENTESIS ASP PLEURAL SPACE W/IMG GUIDE  12/23/2016    Allergies: Patient has no known allergies.  Medications: Prior to Admission medications   Medication Sig Start Date End Date Taking? Authorizing Provider  lisinopril (PRINIVIL,ZESTRIL) 20 MG tablet Take 1 tablet (20 mg total) by mouth daily. 10/17/16 02/13/17  Camnitz, Andree Coss, MD  metoprolol succinate (TOPROL-XL) 50 MG 24 hr tablet Take 1 tablet (50 mg total) by mouth daily. Take with or immediately following a meal. 10/17/16   Camnitz, Andree Coss, MD  ondansetron (ZOFRAN ODT) 4 MG disintegrating tablet Take 1 tablet (4 mg total) by mouth every 8 (eight) hours as needed for nausea or vomiting. 11/26/16   Khatri, Hina, PA-C  oxyCODONE (OXY IR/ROXICODONE) 5 MG immediate release tablet Take 1 tablet (5 mg total) every 4 (four) hours as needed by mouth for severe pain  or breakthrough pain. 12/25/16   Rayburn, Alphonsus Sias, PA-C  oxyCODONE-acetaminophen (PERCOCET/ROXICET) 5-325 MG tablet Take 1 tablet by mouth every 6 (six) hours as needed. pain 11/26/16    [provider]  pravastatin (PRAVACHOL) 20 MG tablet Take 1 tablet (20 mg total) by mouth every evening. Patient not taking: Reported on 02/09/2017 11/07/16 11/07/17  Tysinger, Kermit Balo, PA-C     Family History  Problem Relation Age of Onset  . Hypertension Mother   . Cataracts Mother   . Hyperlipidemia Father   . Pancreatic cancer Maternal Grandmother   . Cancer Maternal Grandmother   . Diabetes Other   . Heart disease Neg Hx   . Stroke Neg Hx     Social History   Socioeconomic History  . Marital status: Married    Spouse name: Not on file  . Number of children: Not on file  . Years of education: Not on file  . Highest education level: Not on file  Social Needs  . Financial resource strain: Not on file  . Food insecurity - worry: Not on file  . Food insecurity - inability: Not on file  . Transportation needs - medical: Not on file  . Transportation needs - non-medical: Not on file  Occupational History  . Not on file  Tobacco Use  . Smoking status: Never Smoker  . Smokeless tobacco: Never Used  Substance and Sexual Activity  . Alcohol use: Yes    Alcohol/week: 2.4 oz    Types: 3 Cans of beer, 1 Shots of liquor per week    Comment: occ, history of 2-3 years of heavier alcohol  . Drug use: No  . Sexual activity: Not on file  Other Topics Concern  . Not on file  Social History Narrative   Lives at home with wife and 1yo son. Son's birthday end of September.   Exercise - some jogging 2-3 times per week.    Considering joining gym.   10/2016     Vital Signs: BP 115/60   Pulse (!) 55   Temp 97.9 F (36.6 C)   SpO2 100%   Physical Exam  Constitutional: He is oriented to person, place, and time.  Abdominal: Soft. Bowel sounds are normal.  Musculoskeletal: Normal range of motion.  Neurological: He is alert and oriented to person, place, and time.  Skin: Skin is warm and dry.  Site of drain is clean and dry OP in bag is minimal Drain injections shows no  communication Drain removed at bedside without complication   Psychiatric: He has a normal mood and affect. His behavior is normal.  Nursing note and vitals reviewed.   Imaging: Ir Radiologist Eval & Mgmt  Result Date: 02/21/2017 Please refer to notes tab for details about interventional procedure. (Op Note)   Labs:  CBC: Recent Labs    12/23/16 0214 12/24/16 0402 12/25/16 0852 12/30/16 1155  WBC 13.5* 10.3 8.6 10.5  HGB 9.3* 8.8* 10.0* 11.7*  HCT 30.6* 29.2* 32.2* 37.8*  PLT 451* 494* 544* 851*    COAGS: Recent Labs    12/08/16 0747 12/23/16 0214  INR 1.08 1.19    BMP: Recent Labs    12/23/16 0214 12/24/16 0402 12/25/16 0852 12/30/16 1155  NA 130* 133* 135 137  K 3.8 3.6 4.0 3.7  CL 99* 100* 103 102  CO2 24 26 25 26   GLUCOSE 112* 97 95 112*  BUN 9 6 5* 13  CALCIUM 7.4* 7.5* 8.2* 9.2  CREATININE 0.84 0.80  0.75 0.86  GFRNONAA >60 >60 >60 >60  GFRAA >60 >60 >60 >60    LIVER FUNCTION TESTS: Recent Labs    12/21/16 1841 12/24/16 0402 12/25/16 0852 12/30/16 1155  BILITOT 0.7 0.5 1.0 0.6  AST 19 15 32 60*  ALT 16* 12* 14* 93*  ALKPHOS 120 108 114 214*  PROT 6.4* 5.5* 6.4* 8.2*  ALBUMIN 2.3* 1.9* 2.1* 2.6*    Assessment:  RUQ collection resolved OP of drain minimal Afeb No communication per drain injection Drain removal per Dr Miles Costain Follow with Dr Lacy Duverney   Signed: Ralene Muskrat A, PA-C 02/21/2017, 2:33 PM   Please refer to Dr. Miles Costain attestation of this note for management and plan.

## 2017-03-24 ENCOUNTER — Other Ambulatory Visit: Payer: Self-pay | Admitting: Gastroenterology

## 2017-03-27 ENCOUNTER — Other Ambulatory Visit: Payer: Self-pay | Admitting: Gastroenterology

## 2017-04-10 ENCOUNTER — Encounter (HOSPITAL_COMMUNITY): Payer: Self-pay | Admitting: *Deleted

## 2017-04-10 ENCOUNTER — Ambulatory Visit (HOSPITAL_COMMUNITY): Payer: No Typology Code available for payment source | Attending: Cardiology

## 2017-04-10 ENCOUNTER — Other Ambulatory Visit: Payer: Self-pay

## 2017-04-10 DIAGNOSIS — I5022 Chronic systolic (congestive) heart failure: Secondary | ICD-10-CM | POA: Insufficient documentation

## 2017-04-10 DIAGNOSIS — Z6830 Body mass index (BMI) 30.0-30.9, adult: Secondary | ICD-10-CM | POA: Diagnosis not present

## 2017-04-10 DIAGNOSIS — I110 Hypertensive heart disease with heart failure: Secondary | ICD-10-CM | POA: Insufficient documentation

## 2017-04-10 DIAGNOSIS — E669 Obesity, unspecified: Secondary | ICD-10-CM | POA: Insufficient documentation

## 2017-04-10 DIAGNOSIS — R002 Palpitations: Secondary | ICD-10-CM | POA: Diagnosis not present

## 2017-04-10 DIAGNOSIS — I5020 Unspecified systolic (congestive) heart failure: Secondary | ICD-10-CM | POA: Diagnosis not present

## 2017-04-10 MED ORDER — PERFLUTREN LIPID MICROSPHERE
1.0000 mL | INTRAVENOUS | Status: AC | PRN
  Administered 2017-04-10: 2 mL via INTRAVENOUS

## 2017-04-10 NOTE — Progress Notes (Signed)
Spoke with pt for pre-op call. Pt denies cardiac history, chest pain or sob. Pt states he is not diabetic.   EKG - 12/30/16 - in Epic Stress - 09/12/16 - in Epic Echo -04/10/17 - in Epic

## 2017-04-11 ENCOUNTER — Ambulatory Visit (HOSPITAL_COMMUNITY): Payer: No Typology Code available for payment source | Admitting: Anesthesiology

## 2017-04-11 ENCOUNTER — Encounter (HOSPITAL_COMMUNITY)
Admission: RE | Disposition: A | Discharge status: Home / Self Care | Payer: Self-pay | Source: Ambulatory Visit | Attending: Gastroenterology

## 2017-04-11 ENCOUNTER — Other Ambulatory Visit: Payer: Self-pay

## 2017-04-11 ENCOUNTER — Ambulatory Visit (HOSPITAL_COMMUNITY)
Admission: RE | Admit: 2017-04-11 | Discharge: 2017-04-11 | Disposition: A | Discharge status: Home / Self Care | Payer: No Typology Code available for payment source | Source: Ambulatory Visit | Attending: Gastroenterology | Admitting: Gastroenterology

## 2017-04-11 ENCOUNTER — Encounter (HOSPITAL_COMMUNITY): Payer: Self-pay | Admitting: *Deleted

## 2017-04-11 DIAGNOSIS — E079 Disorder of thyroid, unspecified: Secondary | ICD-10-CM | POA: Insufficient documentation

## 2017-04-11 DIAGNOSIS — Z4589 Encounter for adjustment and management of other implanted devices: Secondary | ICD-10-CM | POA: Insufficient documentation

## 2017-04-11 DIAGNOSIS — Z79899 Other long term (current) drug therapy: Secondary | ICD-10-CM | POA: Insufficient documentation

## 2017-04-11 DIAGNOSIS — I10 Essential (primary) hypertension: Secondary | ICD-10-CM | POA: Insufficient documentation

## 2017-04-11 HISTORY — PX: ESOPHAGOGASTRODUODENOSCOPY (EGD) WITH PROPOFOL: SHX5813

## 2017-04-11 SURGERY — ESOPHAGOGASTRODUODENOSCOPY (EGD) WITH PROPOFOL
Anesthesia: Monitor Anesthesia Care

## 2017-04-11 MED ORDER — LIDOCAINE HCL (CARDIAC) 20 MG/ML IV SOLN
INTRAVENOUS | Status: DC | PRN
  Administered 2017-04-11: 60 mg via INTRATRACHEAL

## 2017-04-11 MED ORDER — PROPOFOL 500 MG/50ML IV EMUL
INTRAVENOUS | Status: DC | PRN
  Administered 2017-04-11: 100 ug/kg/min via INTRAVENOUS

## 2017-04-11 MED ORDER — PROPOFOL 10 MG/ML IV BOLUS
INTRAVENOUS | Status: DC | PRN
  Administered 2017-04-11: 50 mg via INTRAVENOUS
  Administered 2017-04-11: 100 mg via INTRAVENOUS

## 2017-04-11 MED ORDER — LACTATED RINGERS IV SOLN
INTRAVENOUS | Status: AC | PRN
  Administered 2017-04-11: 1000 mL via INTRAVENOUS

## 2017-04-11 MED ORDER — SODIUM CHLORIDE 0.9 % IV SOLN: INTRAVENOUS | Status: DC

## 2017-04-11 SURGICAL SUPPLY — 14 items

## 2017-04-11 NOTE — Transfer of Care (Signed)
Immediate Anesthesia Transfer of Care Note  Patient: Bobby Kelly  Procedure(s) Performed: ESOPHAGOGASTRODUODENOSCOPY (EGD) WITH PROPOFOL (N/A )  Patient Location: PACU  Anesthesia Type:MAC  Level of Consciousness: awake, alert , oriented, drowsy and patient cooperative  Airway & Oxygen Therapy: Patient Spontanous Breathing and Patient connected to nasal cannula oxygen  Post-op Assessment: Report given to RN, Post -op Vital signs reviewed and stable, Patient moving all extremities and Patient moving all extremities X 4  Post vital signs: Reviewed and stable  Last Vitals:  Vitals:   04/11/17 1107 04/11/17 1300  BP: 110/74 (!) 107/58  Pulse:  85  Resp: 20 14  Temp: 36.6 C 36.4 C  SpO2: 99% 100%    Last Pain:  Vitals:   04/11/17 1107  TempSrc: Oral         Complications: No apparent anesthesia complications

## 2017-04-11 NOTE — Anesthesia Postprocedure Evaluation (Signed)
Anesthesia Post Note  Patient: Kasai Landgren  Procedure(s) Performed: ESOPHAGOGASTRODUODENOSCOPY (EGD) WITH PROPOFOL (N/A )     Patient location during evaluation: PACU Anesthesia Type: MAC Level of consciousness: awake and alert Pain management: pain level controlled Vital Signs Assessment: post-procedure vital signs reviewed and stable Respiratory status: spontaneous breathing, nonlabored ventilation and respiratory function stable Cardiovascular status: stable and blood pressure returned to baseline Postop Assessment: no apparent nausea or vomiting Anesthetic complications: no    Last Vitals:  Vitals:   04/11/17 1107 04/11/17 1300  BP: 110/74 (!) 107/58  Pulse:  85  Resp: 20 14  Temp: 36.6 C 36.4 C  SpO2: 99% 100%    Last Pain:  Vitals:   04/11/17 1107  TempSrc: Oral                 Zhion Pevehouse,W. EDMOND

## 2017-04-11 NOTE — Discharge Instructions (Signed)
YOU HAD AN ENDOSCOPIC PROCEDURE TODAY: Refer to the procedure report and other information in the discharge instructions given to you for any specific questions about what was found during the examination. If this information does not answer your questions, please call [facility] office at [phone] to clarify.   YOU SHOULD EXPECT: Some feelings of bloating in the abdomen. Passage of more gas than usual. Walking can help get rid of the air that was put into your GI tract during the procedure and reduce the bloating. If you had a lower endoscopy (such as a colonoscopy or flexible sigmoidoscopy) you may notice spotting of blood in your stool or on the toilet paper. Some abdominal soreness may be present for a day or two, also.  DIET: Your first meal following the procedure should be a light meal and then it is ok to progress to your normal diet. A half-sandwich or bowl of soup is an example of a good first meal. Heavy or fried foods are harder to digest and may make you feel nauseous or bloated. Drink plenty of fluids but you should avoid alcoholic beverages for 24 hours. If you had a esophageal dilation, please see attached instructions for diet.   ACTIVITY: Your care partner should take you home directly after the procedure. You should plan to take it easy, moving slowly for the rest of the day. You can resume normal activity the day after the procedure however YOU SHOULD NOT DRIVE, use power tools, machinery or perform tasks that involve climbing or major physical exertion for 24 hours (because of the sedation medicines used during the test).   SYMPTOMS TO REPORT IMMEDIATELY: Pain yellow jaundice signs of GI blood loss nausea vomiting high fevers and call me if any question or problems or any ongoing GI symptoms and follow-up as needed A gastroenterologist can be reached at any hour. Please call [phone]  for any of the following symptoms:  Following lower endoscopy (colonoscopy, flexible  sigmoidoscopy) Excessive amounts of blood in the stool  Significant tenderness, worsening of abdominal pains  Swelling of the abdomen that is new, acute  Fever of 100 or higher  Following upper endoscopy (EGD, EUS, ERCP, esophageal dilation) Vomiting of blood or coffee ground material  New, significant abdominal pain  New, significant chest pain or pain under the shoulder blades  Painful or persistently difficult swallowing  New shortness of breath  Black, tarry-looking or red, bloody stools  FOLLOW UP:  If any biopsies were taken you will be contacted by phone or by letter within the next 1-3 weeks. Call [phone]  if you have not heard about the biopsies in 3 weeks.  Please also call with any specific questions about appointments or follow up tests.

## 2017-04-11 NOTE — Anesthesia Procedure Notes (Signed)
Procedure Name: MAC Date/Time: 04/11/2017 12:40 PM Performed by: Izora Gala, CRNA Pre-anesthesia Checklist: Patient identified, Emergency Drugs available, Suction available and Patient being monitored Patient Re-evaluated:Patient Re-evaluated prior to induction Oxygen Delivery Method: Nasal cannula Preoxygenation: Pre-oxygenation with 100% oxygen Induction Type: IV induction Placement Confirmation: positive ETCO2

## 2017-04-11 NOTE — Anesthesia Preprocedure Evaluation (Addendum)
Anesthesia Evaluation  Patient identified by MRN, date of birth, ID band Patient awake    Reviewed: Allergy & Precautions, H&P , NPO status , Patient's Chart, lab work & pertinent test results, reviewed documented beta blocker date and time   Airway Mallampati: II  TM Distance: >3 FB Neck ROM: Full    Dental no notable dental hx. (+) Teeth Intact, Dental Advisory Given   Pulmonary neg pulmonary ROS,    Pulmonary exam normal breath sounds clear to auscultation       Cardiovascular hypertension, Pt. on medications and Pt. on home beta blockers  Rhythm:Regular Rate:Normal     Neuro/Psych negative neurological ROS  negative psych ROS   GI/Hepatic negative GI ROS, Neg liver ROS,   Endo/Other  negative endocrine ROS  Renal/GU negative Renal ROS  negative genitourinary   Musculoskeletal   Abdominal   Peds  Hematology negative hematology ROS (+)   Anesthesia Other Findings   Reproductive/Obstetrics negative OB ROS                            Anesthesia Physical Anesthesia Plan  ASA: II  Anesthesia Plan: MAC   Post-op Pain Management:    Induction: Intravenous  PONV Risk Score and Plan: 1 and Propofol infusion  Airway Management Planned: Nasal Cannula  Additional Equipment:   Intra-op Plan:   Post-operative Plan: Extubation in OR  Informed Consent: I have reviewed the patients History and Physical, chart, labs and discussed the procedure including the risks, benefits and alternatives for the proposed anesthesia with the patient or authorized representative who has indicated his/her understanding and acceptance.   Dental advisory given  Plan Discussed with: CRNA  Anesthesia Plan Comments:         Anesthesia Quick Evaluation

## 2017-04-11 NOTE — Progress Notes (Signed)
Bobby Kelly 12:37 PM  Subjective: Patient without any new complaints since we saw him recently in the office  Objective: Vital signs stable afebrile no acute distress exam please see preassessment evaluation  Assessment: Bile leak seemingly resolved  Plan: Okay to proceed with endoscopy and stent removal with anesthesia assistance  [facility] E  Pager [phone] After 5PM or if no answer call [phone]

## 2017-04-11 NOTE — Op Note (Signed)
[facility] Patient Name: Bobby Kelly Procedure Date : 04/11/2017 MRN: [id number] Attending MD: Vida Rigger , MD Date of Birth: 30-Sep-1980 CSN: [id number] Age: 37 Admit Type: Outpatient Procedure:                Upper GI endoscopy Indications:              Foreign body in the small bowel Providers:                Vida Rigger, MD, Dwain Sarna, RN, Zoila Shutter,                            Technician, Coralee Rud, CRNA Referring MD:              Medicines:                Propofol total dose 220 mg IV Complications:            No immediate complications. Estimated Blood Loss:     Estimated blood loss: none. Procedure:                Pre-Anesthesia Assessment:                           - Prior to the procedure, a History and Physical                            was performed, and patient medications and                            allergies were reviewed. The patient's tolerance of                            previous anesthesia was also reviewed. The risks                            and benefits of the procedure and the sedation                            options and risks were discussed with the patient.                            All questions were answered, and informed consent                            was obtained. Prior Anticoagulants: The patient has                            taken no previous anticoagulant or antiplatelet                            agents. ASA Grade Assessment: II - A patient with                            mild systemic disease. After reviewing the risks  and benefits, the patient was deemed in                            satisfactory condition to undergo the procedure.                           After obtaining informed consent, the endoscope was                            passed under direct vision. Throughout the                            procedure, the patient's blood pressure, pulse, and   oxygen saturations were monitored continuously. The                            [id number] ([id number]) scope was introduced through the                            mouth, and advanced to the second part of duodenum.                            The upper GI endoscopy was accomplished without                            difficulty. The patient tolerated the procedure                            well. Scope In: Scope Out: Findings:      A previously placed plastic stent was seen in the second portion of the       duodenum. Stent removal was accomplished with a snare. Verification of       patient identification for the specimen was done by the physician, nurse       and technician. Estimated blood loss: none.      The exam was otherwise without abnormality.complete endoscopy was not       done and the scope was not retroflexed in the stomach and we proceeded       to the duodenum rather quickly and then once the stent was snared it was       withdrawn back to the scope and the scope snare and stent were removed       together and the procedure was terminated at that point Impression:               - Plastic stent in the duodenum. Removed.                           - The examination was otherwise normal. Moderate Sedation:      moderate sedation-none Recommendation:           - Patient has a contact number available for                            emergencies. The signs and symptoms of potential  delayed complications were discussed with the                            patient. Return to normal activities tomorrow.                            Written discharge instructions were provided to the                            patient.                           - Soft diet today.                           - Continue present medications.                           - Return to GI clinic PRN.                           - Telephone GI clinic if symptomatic PRN. Procedure Code(s):         --- Professional ---                           [phone], Esophagogastroduodenoscopy, flexible,                            transoral; with removal of foreign body(s) Diagnosis Code(s):        --- Professional ---                           Z46.59, Encounter for fitting and adjustment of                            other gastrointestinal appliance and device                           T18.3XXA, Foreign body in small intestine, initial                            encounter CPT copyright 2016 [employer]. All rights reserved. The codes documented in this report are preliminary and upon coder review may  be revised to meet current compliance requirements. Vida Rigger, MD 04/11/2017 1:14:04 PM This report has been signed electronically. Number of Addenda: 0

## 2017-04-12 ENCOUNTER — Encounter (HOSPITAL_COMMUNITY): Payer: Self-pay | Admitting: Gastroenterology

## 2017-06-13 ENCOUNTER — Encounter: Payer: Self-pay | Admitting: Medical

## 2017-06-13 ENCOUNTER — Ambulatory Visit (INDEPENDENT_AMBULATORY_CARE_PROVIDER_SITE_OTHER): Payer: No Typology Code available for payment source | Admitting: Medical

## 2017-06-13 VITALS — BP 112/80 | HR 91 | Temp 97.8°F | Ht 64.25 in | Wt 202.8 lb

## 2017-06-13 DIAGNOSIS — R059 Cough, unspecified: Secondary | ICD-10-CM

## 2017-06-13 DIAGNOSIS — R05 Cough: Secondary | ICD-10-CM | POA: Diagnosis not present

## 2017-06-13 DIAGNOSIS — T464X5A Adverse effect of angiotensin-converting-enzyme inhibitors, initial encounter: Secondary | ICD-10-CM

## 2017-06-13 MED ORDER — CETIRIZINE HCL 10 MG PO TABS
10.0000 mg | ORAL_TABLET | Freq: Every day | ORAL | 1 refills | Status: DC
Start: 2017-06-13 — End: 2017-09-27

## 2017-06-13 MED ORDER — LOSARTAN POTASSIUM 50 MG PO TABS: 50.0000 mg | ORAL_TABLET | Freq: Every day | ORAL | 2 refills | Status: DC

## 2017-06-13 MED ORDER — BENZONATATE 200 MG PO CAPS: 200.0000 mg | ORAL_CAPSULE | Freq: Three times a day (TID) | ORAL | 0 refills | Status: DC | PRN

## 2017-06-13 NOTE — Patient Instructions (Addendum)
I suspect the cough is either from allergies, or more likely from the Lisinopril blood pressure medication  Recommendations:  Begin Tessalon Perles for cough up to 3 times daily as needed  Begin Zyrtec/Cetirizine 10mg  at bedtime for allergies for the next 2 weeks.  You can go longer on this if needed.  STOP Lisinopril and CHANGE to Losartan 50mg  daily.    Call back within 3-4 weeks to let me know if Losartan is doing ok.     If your blood pressures continue to look good and you are tolerating Losartan, then we will change this to 90 day supply  In general avoid Sudafed, Phenylephrine or other over the counter decongestants as they raise blood pressure

## 2017-06-13 NOTE — Progress Notes (Signed)
Subjective: Chief Complaint  Patient presents with  . Cough    x2 months worsening over time with congestion and itchy throat   Here for cough and throat itchy for 2 months, worse in past week.   Cough awakening him up at night.   Persistent cough.   Has some sneezing, some congestion.   Has had some itchy and watery eyes.   No SOB, no wheezing, no edema, no current GERD issues.    Nonsmoker, no fever, no night sweats.   He started Lisinopril within past year.    Had hospitalization for CHF this past yeawr.   Just had updated echo 04/2017 that was reviewed.   No other aggravating or relieving factors. No other complaint.   Past Medical History:  Diagnosis Date  . Allergy   . CHF (congestive heart failure) (HCC)   . Cholelithiasis   . Dry skin    nasal folds  . HTN (hypertension)   . Hypertension   . Thyroid disease    hyperactive in high school, had oral therapy.    . Wears contact lenses    Current Outpatient Medications on File Prior to Visit  Medication Sig Dispense Refill  . acetaminophen (TYLENOL) 325 MG tablet Take 650 mg by mouth every 6 (six) hours as needed for moderate pain or headache.     . metoprolol succinate (TOPROL-XL) 50 MG 24 hr tablet Take 1 tablet (50 mg total) by mouth daily. Take with or immediately following a meal. 90 tablet 3   No current facility-administered medications on file prior to visit.    ROS as in subjective    Objective: BP 112/80   Pulse 91   Temp 97.8 F (36.6 C) (Oral)   Ht 5' 4.25" (1.632 m)   Wt 202 lb 12.8 oz (92 kg)   SpO2 97%   BMI 34.54 kg/m   Wt Readings from Last 3 Encounters:  06/13/17 202 lb 12.8 oz (92 kg)  04/11/17 180 lb (81.6 kg)  02/09/17 180 lb (81.6 kg)    General appearance: alert, no distress, WD/WN,  HEENT: normocephalic, sclerae anicteric, TMs pearly, nares patent, no discharge or erythema, pharynx normal Oral cavity: MMM, no lesions Neck: supple, no lymphadenopathy, no thyromegaly, no masses Heart: RRR,  normal S1, S2, no murmurs Lungs: CTA bilaterally, no wheezes, rhonchi, or rales Pulses: 2+ symmetric, upper and lower extremities, normal cap refill Ext: no edema     Assessment: Encounter Diagnoses  Name Primary?  . Cough Yes  . Cough due to ACE inhibitor      Plan: Discussed symptoms.  Exam normal.  I suspect ACEi induced cough, but possible allergy related as well given pollen season.     Patient Instructions  I suspect the cough is either from allergies, or more likely from the Lisinopril blood pressure medication  Recommendations:  Begin Tessalon Perles for cough up to 3 times daily as needed  Begin Zyrtec/Cetirizine 10mg  at bedtime for allergies for the next 2 weeks.  You can go longer on this if needed.  STOP Lisinopril and CHANGE to Losartan 50mg  daily.    Call back within 3-4 weeks to let me know if Losartan is doing ok.     If your blood pressures continue to look good and you are tolerating Losartan, then we will change this to 90 day supply  In general avoid Sudafed, Phenylephrine or other over the counter decongestants as they raise blood pressure     Manson was seen today  for cough.  Diagnoses and all orders for this visit:  Cough  Cough due to ACE inhibitor  Other orders -     losartan (COZAAR) 50 MG tablet; Take 1 tablet (50 mg total) by mouth daily. -     benzonatate (TESSALON) 200 MG capsule; Take 1 capsule (200 mg total) by mouth 3 (three) times daily as needed for cough. -     cetirizine (ZYRTEC) 10 MG tablet; Take 1 tablet (10 mg total) by mouth at bedtime.  f/u if cough not resolved in 2 weeks.

## 2017-09-05 ENCOUNTER — Other Ambulatory Visit: Payer: Self-pay | Admitting: Medical

## 2017-09-25 ENCOUNTER — Encounter: Payer: Self-pay | Admitting: Family Medicine

## 2017-09-25 ENCOUNTER — Other Ambulatory Visit: Payer: Self-pay | Admitting: Internal Medicine

## 2017-09-25 ENCOUNTER — Ambulatory Visit (INDEPENDENT_AMBULATORY_CARE_PROVIDER_SITE_OTHER): Payer: No Typology Code available for payment source | Admitting: Family Medicine

## 2017-09-25 VITALS — BP 110/78 | HR 80 | Ht 65.0 in | Wt 217.6 lb

## 2017-09-25 DIAGNOSIS — I509 Heart failure, unspecified: Secondary | ICD-10-CM

## 2017-09-25 DIAGNOSIS — R0789 Other chest pain: Secondary | ICD-10-CM

## 2017-09-25 MED ORDER — NITROGLYCERIN 0.4 MG SL SUBL: 0.4000 mg | SUBLINGUAL_TABLET | SUBLINGUAL | 3 refills | Status: DC | PRN

## 2017-09-25 MED ORDER — NITROGLYCERIN 0.4 MG SL SUBL: SUBLINGUAL_TABLET | SUBLINGUAL | 3 refills | Status: DC

## 2017-09-25 NOTE — Progress Notes (Signed)
   Subjective:    Patient ID: Bobby Kelly, male    DOB: 08/16/1980, 37 y.o.   MRN: [id number]  HPI He is here for consultation concerning noting a dull chest discomfort as well as increased heart rate last Friday.  He also describes a numb feeling in his left arm.  Symptoms lasted all night.  He woke up on Saturday and essentially was having the same symptoms.  He and then went out and mow the lawn and did note fatigue as well as diaphoresis as well as increased heart rate.  This did slightly improve after he came inside.  Saturday evening he noted the onset of dizziness associated with the same symptoms.  He does not smoke.  He has not tried any Maalox or Mylanta.  He states that food has no effect on this.  There is no family history of heart disease although he has seen cardiology in the past and does have evidence of CHF.   Review of Systems     Objective:   Physical Exam Alert and in no distress. Tympanic membranes and canals are normal. Pharyngeal area is normal. Neck is supple without adenopathy or thyromegaly. Cardiac exam shows a regular sinus rhythm without murmurs or gallops. Lungs are clear to auscultation. EKG shows no change from previous tracing however the tracing did show poor R wave progression and question of ST depression in the inferior leads.       Assessment & Plan:  Chest discomfort - Plan: nitroGLYCERIN (NITROSTAT) 0.4 MG SL tablet, DISCONTINUED: nitroGLYCERIN (NITROSTAT) 0.4 MG SL tablet  Congestive heart failure, unspecified HF chronicity, unspecified heart failure type (HCC) Case was discussed with [name].  He will be scheduled for a visit.  Also wrote a prescription for nitroglycerin and discussed proper use of that medication.

## 2017-09-26 ENCOUNTER — Emergency Department (HOSPITAL_COMMUNITY): Payer: No Typology Code available for payment source

## 2017-09-26 ENCOUNTER — Encounter (HOSPITAL_COMMUNITY): Payer: Self-pay | Admitting: Emergency Medicine

## 2017-09-26 ENCOUNTER — Emergency Department (HOSPITAL_COMMUNITY)
Admission: EM | Admit: 2017-09-26 | Discharge: 2017-09-26 | Disposition: A | Discharge status: Home / Self Care | Payer: No Typology Code available for payment source | Attending: Emergency Medicine | Admitting: Emergency Medicine

## 2017-09-26 ENCOUNTER — Other Ambulatory Visit: Payer: Self-pay

## 2017-09-26 DIAGNOSIS — Z7982 Long term (current) use of aspirin: Secondary | ICD-10-CM | POA: Insufficient documentation

## 2017-09-26 DIAGNOSIS — R0600 Dyspnea, unspecified: Secondary | ICD-10-CM

## 2017-09-26 DIAGNOSIS — I509 Heart failure, unspecified: Secondary | ICD-10-CM | POA: Diagnosis not present

## 2017-09-26 DIAGNOSIS — I110 Hypertensive heart disease with heart failure: Secondary | ICD-10-CM | POA: Diagnosis not present

## 2017-09-26 DIAGNOSIS — Z79899 Other long term (current) drug therapy: Secondary | ICD-10-CM | POA: Insufficient documentation

## 2017-09-26 LAB — CBC
HEMATOCRIT: 49.5 % (ref 39.0–52.0)
HEMOGLOBIN: 16.2 g/dL (ref 13.0–17.0)
MCH: 30.1 pg (ref 26.0–34.0)
MCHC: 32.7 g/dL (ref 30.0–36.0)
MCV: 91.8 fL (ref 78.0–100.0)
Platelets: 281 10*3/uL (ref 150–400)
RBC: 5.39 MIL/uL (ref 4.22–5.81)
RDW: 13.3 % (ref 11.5–15.5)
WBC: 9.9 10*3/uL (ref 4.0–10.5)

## 2017-09-26 LAB — BASIC METABOLIC PANEL
Anion gap: 9 (ref 5–15)
BUN: 13 mg/dL (ref 6–20)
CO2: 24 mmol/L (ref 22–32)
Calcium: 9.4 mg/dL (ref 8.9–10.3)
Chloride: 105 mmol/L (ref 98–111)
Creatinine, Ser: 0.93 mg/dL (ref 0.61–1.24)
GFR calc non Af Amer: >60 mL/min (ref >60)
Glucose, Bld: 109 mg/dL — ABNORMAL HIGH (ref 70–99)
POTASSIUM: 3.8 mmol/L (ref 3.5–5.1)
SODIUM: 138 mmol/L (ref 135–145)

## 2017-09-26 LAB — BRAIN NATRIURETIC PEPTIDE: B Natriuretic Peptide: 9.6 pg/mL (ref 0.0–100.0)

## 2017-09-26 LAB — I-STAT TROPONIN, ED
TROPONIN I, POC: 0 ng/mL (ref 0.00–0.08)
Troponin i, poc: 0 ng/mL (ref 0.00–0.08)

## 2017-09-26 MED ORDER — IOPAMIDOL (ISOVUE-370) INJECTION 76%
100.0000 mL | Freq: Once | INTRAVENOUS | Status: AC | PRN
  Administered 2017-09-26: 100 mL via INTRAVENOUS

## 2017-09-26 MED ORDER — ALBUTEROL SULFATE (2.5 MG/3ML) 0.083% IN NEBU
5.0000 mg | INHALATION_SOLUTION | Freq: Once | RESPIRATORY_TRACT | Status: AC
  Administered 2017-09-26: 5 mg via RESPIRATORY_TRACT
  Filled 2017-09-26: qty 6

## 2017-09-26 NOTE — ED Notes (Signed)
ED Provider at bedside. 

## 2017-09-26 NOTE — Addendum Note (Signed)
Addended by: Renelda Loma on: 09/26/2017 08:34 AM   Modules accepted: Orders

## 2017-09-26 NOTE — Addendum Note (Signed)
Addended by: Ronnald Nian on: 09/26/2017 08:39 AM   Modules accepted: Orders

## 2017-09-26 NOTE — Discharge Instructions (Addendum)
Keep your appointment with the cardiologist for tomorrow at 11 AM

## 2017-09-26 NOTE — ED Notes (Signed)
Reviewed d/c instructions with pt, who verbalized understanding and had no outstanding questions. Pt departed in NAD, refused use of wheelchair.   

## 2017-09-26 NOTE — ED Provider Notes (Signed)
[facility] EMERGENCY DEPARTMENT Provider Note   CSN: [id number] Arrival date & time: 09/26/17  1703     History   Chief Complaint Chief Complaint  Patient presents with  . Chest Pain    HPI Bobby Kelly is a 37 y.o. male.  37 year old male with history of CHF presents with worsening dyspnea.  Patient several days ago was mowing the lawn and had increased dyspnea as well as chest discomfort.  It got better when he rested.  Saw his physician yesterday for similar symptoms who arranged for patient to have outpatient cardiology follow-up which is scheduled for tomorrow at 11:00.  States while driving home today he became more short of breath and was also dizzy.  His chest pain characterizes today as a lasting for couple seconds and has been nonradiating and not associate with diaphoresis or nausea.  His symptoms resolved with remaining still.  Denies any orthopnea or lower extremity edema.  No treatment used prior to arrival.     Past Medical History:  Diagnosis Date  . Allergy   . CHF (congestive heart failure) (HCC)   . Cholelithiasis   . Dry skin    nasal folds  . HTN (hypertension)   . Hypertension   . Thyroid disease    hyperactive in high school, had oral therapy.    . Wears contact lenses     Patient Active Problem List   Diagnosis Date Noted  . CHF (congestive heart failure) (HCC)   . Cholelithiasis   . Class 2 obesity with serious comorbidity and body mass index (BMI) of 36.0 to 36.9 in adult 11/03/2016  . Palpitation 11/03/2016  . Constipation 04/28/2016    Past Surgical History:  Procedure Laterality Date  . APPENDECTOMY    . CHOLECYSTECTOMY N/A 12/03/2016   Procedure: LAPAROSCOPIC CHOLECYSTECTOMY;  Surgeon: Violeta Gelinas, MD;  Location: [facility] OR;  Service: General;  Laterality: N/A;  . ERCP N/A 12/19/2016   Procedure: ENDOSCOPIC RETROGRADE CHOLANGIOPANCREATOGRAPHY (ERCP);  Surgeon: Vida Rigger, MD;  Location: [facility] ENDOSCOPY;  Service:  Endoscopy;  Laterality: N/A;  . ESOPHAGOGASTRODUODENOSCOPY (EGD) WITH PROPOFOL N/A 12/13/2016   Procedure: ESOPHAGOGASTRODUODENOSCOPY (EGD) WITH PROPOFOL;  Surgeon: Willis Modena, MD;  Location: [facility] ENDOSCOPY;  Service: Endoscopy;  Laterality: N/A;  . ESOPHAGOGASTRODUODENOSCOPY (EGD) WITH PROPOFOL N/A 12/15/2016   Procedure: ESOPHAGOGASTRODUODENOSCOPY (EGD) WITH PROPOFOL;  Surgeon: Willis Modena, MD;  Location: [facility] ENDOSCOPY;  Service: Endoscopy;  Laterality: N/A;  . ESOPHAGOGASTRODUODENOSCOPY (EGD) WITH PROPOFOL N/A 04/11/2017   Procedure: ESOPHAGOGASTRODUODENOSCOPY (EGD) WITH PROPOFOL;  Surgeon: Vida Rigger, MD;  Location: [facility] ENDOSCOPY;  Service: Endoscopy;  Laterality: N/A;  side viewing scope- biliary stent removal  . IR CATHETER TUBE CHANGE  12/23/2016  . IR RADIOLOGIST EVAL & MGMT  01/03/2017  . IR RADIOLOGIST EVAL & MGMT  01/17/2017  . IR RADIOLOGIST EVAL & MGMT  01/25/2017  . IR RADIOLOGIST EVAL & MGMT  02/09/2017  . IR RADIOLOGIST EVAL & MGMT  02/21/2017  . IR THORACENTESIS ASP PLEURAL SPACE W/IMG GUIDE  12/23/2016        Home Medications    Prior to Admission medications   Medication Sig Start Date End Date Taking? Authorizing Provider  acetaminophen (TYLENOL) 325 MG tablet Take 650 mg by mouth every 6 (six) hours as needed for moderate pain or headache.     [provider]  aspirin 325 MG tablet Take 650 mg by mouth daily.    [provider]  cetirizine (ZYRTEC) 10 MG tablet Take 1 tablet (10  mg total) by mouth at bedtime. Patient not taking: Reported on 09/25/2017 06/13/17   Jac Canavan, PA-C  losartan (COZAAR) 50 MG tablet TAKE 1 TABLET(50 MG) BY MOUTH DAILY 09/05/17   Tysinger, Kermit Balo, PA-C  metoprolol succinate (TOPROL-XL) 50 MG 24 hr tablet Take 1 tablet (50 mg total) by mouth daily. Take with or immediately following a meal. 10/17/16   Camnitz, Andree Coss, MD  nitroGLYCERIN (NITROSTAT) 0.4 MG SL tablet Place 1 tablet under tongue every 5 minutes prn for  chest pain up to 2 times and if no improvement, go to the emergency room 09/25/17   Ronnald Nian, MD    Family History Family History  Problem Relation Age of Onset  . Hypertension Mother   . Cataracts Mother   . Hyperlipidemia Father   . Pancreatic cancer Maternal Grandmother   . Cancer Maternal Grandmother   . Diabetes Other   . Heart disease Neg Hx   . Stroke Neg Hx     Social History Social History   Tobacco Use  . Smoking status: Never Smoker  . Smokeless tobacco: Never Used  Substance Use Topics  . Alcohol use: Yes    Alcohol/week: 4.0 standard drinks    Types: 3 Cans of beer, 1 Shots of liquor per week    Comment: occ, history of 2-3 years of heavier alcohol  . Drug use: No     Allergies   Patient has no known allergies.   Review of Systems Review of Systems  All other systems reviewed and are negative.    Physical Exam Updated Vital Signs BP 117/76 (BP Location: Right Arm)   Pulse (!) 58   Temp 98.2 F (36.8 C) (Oral)   Resp 16   Ht 1.651 m (5\' 5" )   Wt 98.7 kg   SpO2 98%   BMI 36.21 kg/m   Physical Exam  Constitutional: He is oriented to person, place, and time. He appears well-developed and well-nourished.  Non-toxic appearance. No distress.  HENT:  Head: Normocephalic and atraumatic.  Eyes: Pupils are equal, round, and reactive to light. Conjunctivae, EOM and lids are normal.  Neck: Normal range of motion. Neck supple. No tracheal deviation present. No thyroid mass present.  Cardiovascular: Normal rate, regular rhythm and normal heart sounds. Exam reveals no gallop.  No murmur heard. Pulmonary/Chest: Effort normal and breath sounds normal. No stridor. No respiratory distress. He has no decreased breath sounds. He has no wheezes. He has no rhonchi. He has no rales.  Abdominal: Soft. Normal appearance and bowel sounds are normal. He exhibits no distension. There is no tenderness. There is no rebound and no CVA tenderness.  Musculoskeletal:  Normal range of motion. He exhibits no edema or tenderness.  Neurological: He is alert and oriented to person, place, and time. He has normal strength. No cranial nerve deficit or sensory deficit. GCS eye subscore is 4. GCS verbal subscore is 5. GCS motor subscore is 6.  Skin: Skin is warm and dry. No abrasion and no rash noted.  Psychiatric: He has a normal mood and affect. His speech is normal and behavior is normal.  Nursing note and vitals reviewed.    ED Treatments / Results  Labs (all labs ordered are listed, but only abnormal results are displayed) Labs Reviewed  BASIC METABOLIC PANEL - Abnormal; Notable for the following components:      Result Value   Glucose, Bld 109 (*)    All other components within normal limits  CBC  I-STAT TROPONIN, ED    EKG EKG Interpretation  Date/Time:  Tuesday September 26 2017 17:08:02 EDT Ventricular Rate:  72 PR Interval:  188 QRS Duration: 82 QT Interval:  414 QTC Calculation: 453 R Axis:   87 Text Interpretation:  Normal sinus rhythm Cannot rule out Anterior infarct , age undetermined Abnormal ECG Confirmed by Lorre Nick ([id number]) on 09/26/2017 7:16:56 PM   Radiology Dg Chest 2 View  Result Date: 09/26/2017 CLINICAL DATA:  Patient reports sob, center chest pains, dizziness, and light headedness since Friday. Denies any cough. Hx of chf, htn takes medication. No known heart or lung surgeries. Non smoker. EXAM: CHEST - 2 VIEW COMPARISON:  Chest x-ray 12/30/2016 FINDINGS: There is mild elevation of RIGHT hemidiaphragm. There has been significant improvement in aeration of the RIGHT lung since prior studies. Heart size is normal. Lungs are clear. IMPRESSION: No active cardiopulmonary disease. Electronically Signed   By: Norva Pavlov M.D.   On: 09/26/2017 18:03    Procedures Procedures (including critical care time)  Medications Ordered in ED Medications - No data to display   Initial Impression / Assessment and Plan / ED Course  I  have reviewed the triage vital signs and the nursing notes.  Pertinent labs & imaging results that were available during my care of the patient were reviewed by me and considered in my medical decision making (see chart for details).    Patient is EKG without acute ischemic changes.  Plan of dyspnea and chest x-ray within normal limits.  CT chest negative for PE. Case discussed with cardiology on-call.  Patient has had negative delta troponin here.  Plan is for patient to keep his appointment with cardiologist tomorrow in the office at 11:00.  Final Clinical Impressions(s) / ED Diagnoses   Final diagnoses:  None    ED Discharge Orders    None       Lorre Nick, MD 09/26/17 2250

## 2017-09-26 NOTE — ED Notes (Signed)
Patient transported to CT 

## 2017-09-26 NOTE — ED Triage Notes (Signed)
Pt reports not feeling well since Friday. Pt reports CP worsening since. Pt reports dizziness, lightheaded, SHOB, and nausea, but no vomiting. Pt states it feels like a pinch or a grab.

## 2017-09-27 ENCOUNTER — Encounter: Payer: Self-pay | Admitting: Cardiology

## 2017-09-27 ENCOUNTER — Ambulatory Visit (INDEPENDENT_AMBULATORY_CARE_PROVIDER_SITE_OTHER): Payer: PRIVATE HEALTH INSURANCE

## 2017-09-27 ENCOUNTER — Ambulatory Visit (INDEPENDENT_AMBULATORY_CARE_PROVIDER_SITE_OTHER): Payer: PRIVATE HEALTH INSURANCE | Admitting: Cardiology

## 2017-09-27 VITALS — BP 114/82 | HR 61 | Ht 65.0 in | Wt 217.6 lb

## 2017-09-27 DIAGNOSIS — R002 Palpitations: Secondary | ICD-10-CM | POA: Diagnosis not present

## 2017-09-27 DIAGNOSIS — I428 Other cardiomyopathies: Secondary | ICD-10-CM | POA: Diagnosis not present

## 2017-09-27 DIAGNOSIS — R079 Chest pain, unspecified: Secondary | ICD-10-CM

## 2017-09-27 MED ORDER — LOSARTAN POTASSIUM 25 MG PO TABS: 25.0000 mg | ORAL_TABLET | Freq: Every day | ORAL | 3 refills | Status: DC

## 2017-09-27 NOTE — Progress Notes (Signed)
Electrophysiology Office Note   Date:  09/27/2017   ID:  Bobby Kelly, DOB 01/24/1981, MRN [id number]  PCP:  Jac Canavan, PA-C  Cardiologist:  none Primary Electrophysiologist:  Bobby Reinders Jorja Loa, MD    No chief complaint on file.    History of Present Illness: Bobby Kelly is a 37 y.o. male who is being seen today for the evaluation of palpitations at the request of [name]. Presenting today for electrophysiology evaluation. Had rapid heart rate when she awoke eariler this month. HR on fit bit 100-108. Complained of chset pain that lasted the whole day. HR was in the 130s that day with minimal activity. His heart rate is remained elevated since that time. He is recently put on hydrochlorothiazide for blood pressure at his primary physician's office. He also endorses chest pain. The pain is in the center of his chest. The pain occurs at times when he exerts himself and is at times improved with rest. The pain also occurs when he is simply resting, such as waking up from sleep. He did say that he was treated for hyperthyroidism in the past. This has not been checked since he was in high school.  Today, denies symptoms of chest pain, shortness of breath, orthopnea, PND, lower extremity edema, claudication, dizziness, presyncope, syncope, bleeding, or neurologic sequela. The patient is tolerating medications without difficulties.  He presents today with palpitations that have returned.  They started last Friday.  His palpitations are associated with weakness and fatigue.  He feels dizzy on a regular basis since that time.  There is no exacerbating or alleviating factors.  He tried to mow his lawn on Saturday, but continued to have weakness, fatigue and palpitations.   Past Medical History:  Diagnosis Date  . Allergy   . CHF (congestive heart failure) (HCC)   . Cholelithiasis   . Dry skin    nasal folds  . HTN (hypertension)   . Hypertension   . Thyroid disease    hyperactive in high school, had oral therapy.    . Wears contact lenses    Past Surgical History:  Procedure Laterality Date  . APPENDECTOMY    . CHOLECYSTECTOMY N/A 12/03/2016   Procedure: LAPAROSCOPIC CHOLECYSTECTOMY;  Surgeon: Violeta Gelinas, MD;  Location: [facility] OR;  Service: General;  Laterality: N/A;  . ERCP N/A 12/19/2016   Procedure: ENDOSCOPIC RETROGRADE CHOLANGIOPANCREATOGRAPHY (ERCP);  Surgeon: Vida Rigger, MD;  Location: [facility] ENDOSCOPY;  Service: Endoscopy;  Laterality: N/A;  . ESOPHAGOGASTRODUODENOSCOPY (EGD) WITH PROPOFOL N/A 12/13/2016   Procedure: ESOPHAGOGASTRODUODENOSCOPY (EGD) WITH PROPOFOL;  Surgeon: Willis Modena, MD;  Location: [facility] ENDOSCOPY;  Service: Endoscopy;  Laterality: N/A;  . ESOPHAGOGASTRODUODENOSCOPY (EGD) WITH PROPOFOL N/A 12/15/2016   Procedure: ESOPHAGOGASTRODUODENOSCOPY (EGD) WITH PROPOFOL;  Surgeon: Willis Modena, MD;  Location: [facility] ENDOSCOPY;  Service: Endoscopy;  Laterality: N/A;  . ESOPHAGOGASTRODUODENOSCOPY (EGD) WITH PROPOFOL N/A 04/11/2017   Procedure: ESOPHAGOGASTRODUODENOSCOPY (EGD) WITH PROPOFOL;  Surgeon: Vida Rigger, MD;  Location: [facility] ENDOSCOPY;  Service: Endoscopy;  Laterality: N/A;  side viewing scope- biliary stent removal  . IR CATHETER TUBE CHANGE  12/23/2016  . IR RADIOLOGIST EVAL & MGMT  01/03/2017  . IR RADIOLOGIST EVAL & MGMT  01/17/2017  . IR RADIOLOGIST EVAL & MGMT  01/25/2017  . IR RADIOLOGIST EVAL & MGMT  02/09/2017  . IR RADIOLOGIST EVAL & MGMT  02/21/2017  . IR THORACENTESIS ASP PLEURAL SPACE W/IMG GUIDE  12/23/2016     Current Outpatient Medications  Medication Sig Dispense Refill  .  acetaminophen (TYLENOL) 325 MG tablet Take 325-650 mg by mouth every 6 (six) hours as needed for mild pain, moderate pain or headache.     Marland Kitchen aspirin 325 MG tablet Take 650 mg by mouth as needed (for arm or chest pain).     . metoprolol succinate (TOPROL-XL) 50 MG 24 hr tablet Take 1 tablet (50 mg total) by mouth daily. Take with or immediately following  a meal. 90 tablet 3  . nitroGLYCERIN (NITROSTAT) 0.4 MG SL tablet Place 1 tablet under tongue every 5 minutes prn for chest pain up to 2 times and if no improvement, go to the emergency room 50 tablet 3  . Propylene Glycol (SYSTANE COMPLETE OP) Place 1-2 drops into both eyes daily.    Marland Kitchen losartan (COZAAR) 25 MG tablet Take 1 tablet (25 mg total) by mouth daily. 90 tablet 3   No current facility-administered medications for this visit.     Allergies:   Patient has no known allergies.   Social History:  The patient  reports that he has never smoked. He has never used smokeless tobacco. He reports that he drinks about 4.0 standard drinks of alcohol per week. He reports that he does not use drugs.   Family History:  The patient's family history includes Cancer in his maternal grandmother; Cataracts in his mother; Diabetes in his other; Hyperlipidemia in his father; Hypertension in his mother; Pancreatic cancer in his maternal grandmother.    ROS:  Please see the history of present illness.   Otherwise, review of systems is positive for fatigue, chest pain, visual changes, dizziness, headaches.   All other systems are reviewed and negative.   PHYSICAL EXAM: VS:  BP 114/82   Pulse 61   Ht 5\' 5"  (1.651 m)   Wt 217 lb 9.6 oz (98.7 kg)   SpO2 98%   BMI 36.21 kg/m  , BMI Body mass index is 36.21 kg/m. GEN: Well nourished, well developed, in no acute distress  HEENT: normal  Neck: no JVD, carotid bruits, or masses Cardiac: RRR; no murmurs, rubs, or gallops,no edema  Respiratory:  clear to auscultation bilaterally, normal work of breathing GI: soft, nontender, nondistended, + BS MS: no deformity or atrophy  Skin: warm and dry Neuro:  Strength and sensation are intact Psych: euthymic mood, full affect  EKG:  EKG is not ordered today. Personal review of the ekg ordered 09/26/17 shows sinus rhythm, rate 72  Recent Labs: 12/30/2016: ALT 93 09/26/2017: B Natriuretic Peptide 9.6; BUN 13;  Creatinine, Ser 0.93; Hemoglobin 16.2; Platelets 281; Potassium 3.8; Sodium 138    Lipid Panel     Component Value Date/Time   CHOL 168 11/03/2016 0811   TRIG 126 11/03/2016 0811   HDL 51 11/03/2016 0811   CHOLHDL 3.3 11/03/2016 0811   LDLCALC 94 11/03/2016 0811     Wt Readings from Last 3 Encounters:  09/27/17 217 lb 9.6 oz (98.7 kg)  09/26/17 217 lb 9.5 oz (98.7 kg)  09/25/17 217 lb 9.6 oz (98.7 kg)      Other studies Reviewed: Additional studies/ records that were reviewed today include:  Myoview 09/12/16  Nuclear stress EF: 44%.  No significant change in nonspecific ST abnormality during infusion.  There is a small defect of mild severity present in the basal inferior and mid inferior location. The defect is partially reversible and consistent with infarct with peri infarct ischemia vs. Variations in diaphragmatic attenuation artifact.  The left ventricular ejection fraction is moderately decreased (30-44%).  This is an intermediate risk study.  Recommend 2D echo for further assessment of LVF and inferior wall motion.  Holter 09/15/16 - personally reviewed Minimum HR: 55 BPM at 12:43:14 AM(2) Maximum HR: 136 BPM at 8:23:05 PM Average HR: 91 BPM Sinus rhythm with sinus tachycardia No arrhythmia seen. <1% APCs  TTE 04/10/17 - Left ventricle: The cavity size was normal. Wall thickness was   normal. Systolic function was normal. The estimated ejection   fraction was in the range of 50% to 55%. Wall motion was normal;   there were no regional wall motion abnormalities. The study is   not technically sufficient to allow evaluation of LV diastolic   function. - Mitral valve: Calcified annulus. Mildly thickened leaflets .   ASSESSMENT AND PLAN:  1.  Palpitations: Patient's have returned and are occurring on a daily basis and associated with weakness and fatigue.  We do not have a documentation of what his palpitations may be.  Terah Robey order a 48-hour monitor.  2.  Systolic heart failure, unknown chronicity: Fortunately, his ejection fraction has improved to 55%.  He is having some weakness and fatigue.  We Jenica Costilow decrease his losartan.  He may require a decrease in his metoprolol pending his monitor.  3. Hypertension: Well-controlled.  Plan to decrease losartan.  Current medicines are reviewed at length with the patient today.   The patient does not have concerns regarding his medicines.  The following changes were made today: Decrease losartan  Labs/ tests ordered today include:  Orders Placed This Encounter  Procedures  . Holter monitor - 48 hour     Disposition:   FU with Kavontae Pritchard 6 months  Signed, Geselle Cardosa Jorja Loa, MD  09/27/2017 11:30 AM     [facility] [address] [place] [address] [phone] (office) [phone] (fax)

## 2017-09-27 NOTE — Patient Instructions (Addendum)
Medication Instructions:  Your physician has recommended you make the following change in your medication:  1. DECREASE Losartan to 25 mg daily  * If you need a refill on your cardiac medications before your next appointment, please call your pharmacy.   Labwork: None ordered  Testing/Procedures: Your physician has recommended that you wear a 48 hour holter monitor. Holter monitors are medical devices that record the heart's electrical activity. Doctors most often use these monitors to diagnose arrhythmias. Arrhythmias are problems with the speed or rhythm of the heartbeat. The monitor is a small, portable device. You can wear one while you do your normal daily activities. This is usually used to diagnose what is causing palpitations/syncope (passing out).   Follow-Up: To be determined based on monitoring results.  *Please note that any paperwork needing to be filled out by the provider will need to be addressed at the front desk prior to seeing the provider. Please note that any FMLA, disability or other documents regarding health condition is subject to a $25.00 charge that must be received prior to completion of paperwork in the form of a money order or check.  Thank you for choosing [facility]!!   Dory Horn, RN [phone]  Any Other Special Instructions Will Be Listed Below (If Applicable).

## 2017-10-02 ENCOUNTER — Encounter: Payer: Self-pay | Admitting: Cardiovascular Disease

## 2017-10-02 ENCOUNTER — Ambulatory Visit (INDEPENDENT_AMBULATORY_CARE_PROVIDER_SITE_OTHER): Payer: PRIVATE HEALTH INSURANCE | Admitting: Cardiovascular Disease

## 2017-10-02 VITALS — BP 130/87 | HR 89 | Ht 65.0 in | Wt 218.2 lb

## 2017-10-02 DIAGNOSIS — I5020 Unspecified systolic (congestive) heart failure: Secondary | ICD-10-CM

## 2017-10-02 DIAGNOSIS — R0683 Snoring: Secondary | ICD-10-CM | POA: Diagnosis not present

## 2017-10-02 DIAGNOSIS — R079 Chest pain, unspecified: Secondary | ICD-10-CM

## 2017-10-02 DIAGNOSIS — I428 Other cardiomyopathies: Secondary | ICD-10-CM

## 2017-10-02 DIAGNOSIS — R400 Somnolence: Secondary | ICD-10-CM | POA: Diagnosis not present

## 2017-10-02 DIAGNOSIS — I10 Essential (primary) hypertension: Secondary | ICD-10-CM

## 2017-10-02 NOTE — Patient Instructions (Addendum)
Medication Instructions:  STOP ASPIRIN   Labwork: NONE  Testing/Procedures: Your physician has recommended that you have a sleep study. This test records several body functions during sleep, including: brain activity, eye movement, oxygen and carbon dioxide blood levels, heart rate and rhythm, breathing rate and rhythm, the flow of air through your mouth and nose, snoring, body muscle movements, and chest and belly movement. THE OFFICE WILL CALL YOU TO SCHEDULE ONCE YOUR INSURANCE HAS APPROVED   Follow-Up: Your physician recommends that you schedule a follow-up appointment in: 3 MONTHS   If you need a refill on your cardiac medications before your next appointment, please call your pharmacy.

## 2017-10-02 NOTE — Progress Notes (Signed)
Cardiology Office Note   Date:  10/02/2017   ID:  Bobby Kelly, DOB [birth year], MRN [id number]  PCP:  Jac Canavan, PA-C  Cardiologist:   Chilton Si, MD   No chief complaint on file.    History of Present Illness: Bobby Kelly is a 37 y.o. male with chronic systolic and diastolic heart failure who is being seen today for the evaluation of heart failure and chest pain at the request of Jac Canavan, PA-C.  [name] first saw [name] 08/2016 when he was referred for tachycardia, chest pain, and shortness of breath.  He was noted to have episodes of tachycardia to the 100s on his Fitbit.  His heart rate increased to the 130s with minimal exertion.  He had recently been started on antihypertensives by his primary care provider.  [name] noted that he had episodes of sinus tachycardia.  He had him wear a 48-hour Holter that did not reveal any other significant arrhythmias.  He had an exercise Myoview 09/12/2016 that revealed LVEF 44% with a concern for inferior infarct with peri-infarct ischemia versus diaphragmatic attenuation.   [name] was seen in the ED 09/26/2017 with dyspnea.  He became short of breath and dizzy when driving home.  Cardiac enzymes were negative and his EKG was he was then referred without ischemic changes.  Prior to that he saw Dr. [name] on 09/25/17 with chest discomfort and palpitations.  For an echocardiogram 09/19/2016 that revealed LVEF 35 to 40% with grade 2 diastolic dysfunction.  Hydrochlorothiazide and amlodipine were switched to losartan and metoprolol.  He had a repeat echocardiogram 04/2017 that revealed improvement in his LVEF to 50 to 55%.  He followed up with [name] 09/27/2017 and losartan was reduced due to low blood pressures.  He continues to have intermittent episodes of chest discomfort.  He had an episode 2 weeks ago that occurred while mowing his lawn.  He is unsure if it was due to pulling the starter on the lawnmower.  The episode  was associated with palpitations, dizziness, and lightheadedness.  For the rest of the day he felt near syncope but did not actually pass out.  This morning he also had some chest pain that occurred while in the shower.  He does not get much exercise but has no chest pain when walking or going up steps.  Since making this change in his losartan he has not had any more tachycardia or near syncope.  [name] reports episodes of orthopnea.  He notes that he does snore.  He is unsure whether he has apneic episodes.  He does not feel well-rested when he wakes up in the morning and could fall asleep easily throughout the day.  He has not noted any lower extremity edema.   Past Medical History:  Diagnosis Date  . Allergy   . CHF (congestive heart failure) (HCC)   . Cholelithiasis   . Dry skin    nasal folds  . HTN (hypertension)   . Hypertension   . Thyroid disease    hyperactive in high school, had oral therapy.    . Wears contact lenses     Past Surgical History:  Procedure Laterality Date  . APPENDECTOMY    . CHOLECYSTECTOMY N/A 12/03/2016   Procedure: LAPAROSCOPIC CHOLECYSTECTOMY;  Surgeon: Violeta Gelinas, MD;  Location: [facility];  Service: General;  Laterality: N/A;  . ERCP N/A 12/19/2016   Procedure: ENDOSCOPIC RETROGRADE CHOLANGIOPANCREATOGRAPHY (ERCP);  Surgeon: Vida Rigger, MD;  Location: [facility] ENDOSCOPY;  Service: Endoscopy;  Laterality: N/A;  . ESOPHAGOGASTRODUODENOSCOPY (EGD) WITH PROPOFOL N/A 12/13/2016   Procedure: ESOPHAGOGASTRODUODENOSCOPY (EGD) WITH PROPOFOL;  Surgeon: Willis Modena, MD;  Location: [facility] ENDOSCOPY;  Service: Endoscopy;  Laterality: N/A;  . ESOPHAGOGASTRODUODENOSCOPY (EGD) WITH PROPOFOL N/A 12/15/2016   Procedure: ESOPHAGOGASTRODUODENOSCOPY (EGD) WITH PROPOFOL;  Surgeon: Willis Modena, MD;  Location: [facility] ENDOSCOPY;  Service: Endoscopy;  Laterality: N/A;  . ESOPHAGOGASTRODUODENOSCOPY (EGD) WITH PROPOFOL N/A 04/11/2017   Procedure: ESOPHAGOGASTRODUODENOSCOPY (EGD) WITH  PROPOFOL;  Surgeon: Vida Rigger, MD;  Location: [facility] ENDOSCOPY;  Service: Endoscopy;  Laterality: N/A;  side viewing scope- biliary stent removal  . IR CATHETER TUBE CHANGE  12/23/2016  . IR RADIOLOGIST EVAL & MGMT  01/03/2017  . IR RADIOLOGIST EVAL & MGMT  01/17/2017  . IR RADIOLOGIST EVAL & MGMT  01/25/2017  . IR RADIOLOGIST EVAL & MGMT  02/09/2017  . IR RADIOLOGIST EVAL & MGMT  02/21/2017  . IR THORACENTESIS ASP PLEURAL SPACE W/IMG GUIDE  12/23/2016     Current Outpatient Medications  Medication Sig Dispense Refill  . acetaminophen (TYLENOL) 325 MG tablet Take 325-650 mg by mouth every 6 (six) hours as needed for mild pain, moderate pain or headache.     . losartan (COZAAR) 25 MG tablet Take 1 tablet (25 mg total) by mouth daily. 90 tablet 3  . metoprolol succinate (TOPROL-XL) 50 MG 24 hr tablet Take 1 tablet (50 mg total) by mouth daily. Take with or immediately following a meal. 90 tablet 3  . nitroGLYCERIN (NITROSTAT) 0.4 MG SL tablet Place 1 tablet under tongue every 5 minutes prn for chest pain up to 2 times and if no improvement, go to the emergency room 50 tablet 3  . Propylene Glycol (SYSTANE COMPLETE OP) Place 1-2 drops into both eyes daily.     No current facility-administered medications for this visit.     Allergies:   Patient has no known allergies.    Social History:  The patient  reports that he has never smoked. He has never used smokeless tobacco. He reports that he drinks about 4.0 standard drinks of alcohol per week. He reports that he does not use drugs.   Family History:  The patient's family history includes Cancer in his maternal grandmother; Cataracts in his mother; Diabetes in his other; Hyperlipidemia in his father; Hypertension in his mother; Pancreatic cancer in his maternal grandmother.    ROS:  Please see the history of present illness.   Otherwise, review of systems are positive for none.   All other systems are reviewed and negative.    PHYSICAL  EXAM: VS:  BP 130/87   Pulse 89   Ht 5\' 5"  (1.651 m)   Wt 218 lb 3.2 oz (99 kg)   BMI 36.31 kg/m  , BMI Body mass index is 36.31 kg/m. GENERAL:  Well appearing HEENT:  Pupils equal round and reactive, fundi not visualized, oral mucosa unremarkable NECK:  No jugular venous distention, waveform within normal limits, carotid upstroke brisk and symmetric, no bruits LUNGS:  Clear to auscultation bilaterally HEART:  RRR.  PMI not displaced or sustained,S1 and S2 within normal limits, no S3, no S4, no clicks, no rubs, no murmurs ABD:  Flat, positive bowel sounds normal in frequency in pitch, no bruits, no rebound, no guarding, no midline pulsatile mass, no hepatomegaly, no splenomegaly EXT:  2 plus pulses throughout, no edema, no cyanosis no clubbing SKIN:  No rashes no nodules NEURO:  Cranial nerves II through XII  grossly intact, motor grossly intact throughout PSYCH:  Cognitively intact, oriented to person place and time    EKG:  EKG is ordered today. The ekg ordered today demonstrates sinus rhythm.  Rate 89 bpm.    Lexiscan Myoview 09/2016:  Nuclear stress EF: 44%.  No significant change in nonspecific ST abnormality during infusion.  There is a small defect of mild severity present in the basal inferior and mid inferior location. The defect is partially reversible and consistent with infarct with peri infarct ischemia vs. Variations in diaphragmatic attenuation artifact.  The left ventricular ejection fraction is moderately decreased (30-44%).  This is an intermediate risk study.  Recommend 2D echo for further assessment of LVF and inferior wall motion.   Echo 09/19/16: Study Conclusions  - Left ventricle: The cavity size was normal. Wall thickness was   normal. Systolic function was moderately reduced. The estimated   ejection fraction was in the range of 35% to 40%. Features are   consistent with a pseudonormal left ventricular filling pattern,   with concomitant abnormal  relaxation and increased filling   pressure (grade 2 diastolic dysfunction). - Right atrium: The atrium was mildly dilated.  Recent Labs: 12/30/2016: ALT 93 09/26/2017: B Natriuretic Peptide 9.6; BUN 13; Creatinine, Ser 0.93; Hemoglobin 16.2; Platelets 281; Potassium 3.8; Sodium 138    Lipid Panel    Component Value Date/Time   CHOL 168 11/03/2016 0811   TRIG 126 11/03/2016 0811   HDL 51 11/03/2016 0811   CHOLHDL 3.3 11/03/2016 0811   LDLCALC 94 11/03/2016 0811      Wt Readings from Last 3 Encounters:  10/02/17 218 lb 3.2 oz (99 kg)  09/27/17 217 lb 9.6 oz (98.7 kg)  09/26/17 217 lb 9.5 oz (98.7 kg)      ASSESSMENT AND PLAN:  # Atypical chest pain: Symptoms are atypical.  He had a negative stress test less than 1 year ago an no coronary calcification was noted on his chest CT 09/2017.  Therefore, it is unlikely that his symptoms are due to ischemia.  He had some mild chest wall tenderness to palpation.  No plans for any additional ischemia evaluation at this time.  # Hypertension: Losartan was recently reduced. BP stable.  Continue losartan and metoprolol.   # Obestiy:  Encouraged to increase exercise to at least 150 minutes per week.   # Snoring:  Sleep study.  Epworth sleepiness scale was 10.   # Chronic systolic and diastolic heart failure:  LVEF improved to 50-55% on most recent assessment.  It is possible it was tachycardia induced.  Continue metoprolol and losartan.   Current medicines are reviewed at length with the patient today.  The patient does not have concerns regarding medicines.  The following changes have been made:  no change  Labs/ tests ordered today include:   Orders Placed This Encounter  Procedures  . EKG 12-Lead  . Split night study     Disposition:   FU with Sebastian Dzik C. Duke Salvia, MD, [facility] in 3 months.      Signed, Andelyn Spade C. Duke Salvia, MD, [facility]  10/02/2017 12:06 PM    [facility]

## 2017-10-06 ENCOUNTER — Telehealth: Payer: Self-pay | Admitting: *Deleted

## 2017-10-06 NOTE — Telephone Encounter (Signed)
Left sleep study appointment and contact information on patient's voicemail. ( ok per dpr signed 04/28/16)

## 2017-11-05 ENCOUNTER — Ambulatory Visit (HOSPITAL_BASED_OUTPATIENT_CLINIC_OR_DEPARTMENT_OTHER)
Payer: No Typology Code available for payment source | Attending: Cardiovascular Disease | Admitting: Cardiovascular Disease

## 2017-11-05 VITALS — Ht 65.0 in | Wt 215.0 lb

## 2017-11-05 DIAGNOSIS — Z79899 Other long term (current) drug therapy: Secondary | ICD-10-CM | POA: Insufficient documentation

## 2017-11-05 DIAGNOSIS — R5383 Other fatigue: Secondary | ICD-10-CM | POA: Insufficient documentation

## 2017-11-05 DIAGNOSIS — R0902 Hypoxemia: Secondary | ICD-10-CM | POA: Insufficient documentation

## 2017-11-05 DIAGNOSIS — G4733 Obstructive sleep apnea (adult) (pediatric): Secondary | ICD-10-CM | POA: Diagnosis not present

## 2017-11-05 DIAGNOSIS — R0683 Snoring: Secondary | ICD-10-CM

## 2017-11-05 DIAGNOSIS — I509 Heart failure, unspecified: Secondary | ICD-10-CM | POA: Diagnosis not present

## 2017-11-05 DIAGNOSIS — R400 Somnolence: Secondary | ICD-10-CM

## 2017-11-06 ENCOUNTER — Ambulatory Visit (INDEPENDENT_AMBULATORY_CARE_PROVIDER_SITE_OTHER): Payer: No Typology Code available for payment source | Admitting: Medical

## 2017-11-06 ENCOUNTER — Encounter: Payer: Self-pay | Admitting: Medical

## 2017-11-06 VITALS — BP 120/70 | HR 76 | Temp 97.8°F | Resp 16 | Ht 65.0 in | Wt 216.8 lb

## 2017-11-06 DIAGNOSIS — R7301 Impaired fasting glucose: Secondary | ICD-10-CM | POA: Insufficient documentation

## 2017-11-06 DIAGNOSIS — Z7185 Encounter for immunization safety counseling: Secondary | ICD-10-CM

## 2017-11-06 DIAGNOSIS — Z9049 Acquired absence of other specified parts of digestive tract: Secondary | ICD-10-CM

## 2017-11-06 DIAGNOSIS — Z0000 Encounter for general adult medical examination without abnormal findings: Secondary | ICD-10-CM

## 2017-11-06 DIAGNOSIS — Z23 Encounter for immunization: Secondary | ICD-10-CM | POA: Diagnosis not present

## 2017-11-06 DIAGNOSIS — Z6836 Body mass index (BMI) 36.0-36.9, adult: Secondary | ICD-10-CM

## 2017-11-06 DIAGNOSIS — Z7189 Other specified counseling: Secondary | ICD-10-CM

## 2017-11-06 LAB — POCT URINALYSIS DIP (PROADVANTAGE DEVICE)
BILIRUBIN UA: NEGATIVE
BILIRUBIN UA: NEGATIVE mg/dL
GLUCOSE UA: NEGATIVE mg/dL
Leukocytes, UA: NEGATIVE
Nitrite, UA: NEGATIVE
RBC UA: NEGATIVE
Specific Gravity, Urine: 1.02
Urobilinogen, Ur: NEGATIVE
pH, UA: 6 (ref 5.0–8.0)

## 2017-11-06 NOTE — Patient Instructions (Addendum)
Thanks for trusting [place] with your health care and for coming in for a physical today.  Below are some general recommendations I have for you:  Yearly screenings See your eye doctor yearly for routine vision care. See your dentist yearly for routine dental care including hygiene visits twice yearly. See me here yearly for a routine physical and preventative care visit   Specific Concerns today:  . If you have not heard back from your sleep study results within a week, then contact cardiology . My main recommendation today is to work on healthy diet, regular exercise, and losing weight . We updated your flu shot today . At your convenience schedule to come in for a tetanus vaccine as well as pneumococcal 23 vaccine to prevent certain strains of pneumonia   Please follow up yearly for a physical.   Preventative Care for Adults - Male      MAINTAIN REGULAR HEALTH EXAMS:  A routine yearly physical is a good way to check in with your primary care provider about your health and preventive screening. It is also an opportunity to share updates about your health and any concerns you have, and receive a thorough all-over exam.   Most health insurance companies pay for at least some preventative services.  Check with your health plan for specific coverages.  WHAT PREVENTATIVE SERVICES DO MEN NEED?  Adult men should have their weight and blood pressure checked regularly.   Men age 85 and older should have their cholesterol levels checked regularly.  Beginning at age 3 and continuing to age 87, men should be screened for colorectal cancer.  Certain people may need continued testing until age 65.  Updating vaccinations is part of preventative care.  Vaccinations help protect against diseases such as the flu.  Osteoporosis is a disease in which the bones lose minerals and strength as we age. Men ages 27 and over should discuss this with their caregivers  Lab tests are generally done as part  of preventative care to screen for anemia and blood disorders, to screen for problems with the kidneys and liver, to screen for bladder problems, to check blood sugar, and to check your cholesterol level.  Preventative services generally include counseling about diet, exercise, avoiding tobacco, drugs, excessive alcohol consumption, and sexually transmitted infections.    GENERAL RECOMMENDATIONS FOR GOOD HEALTH:  Healthy diet:  Eat a variety of foods, including fruit, vegetables, fish, beans, lentils, tofu, and grains, such as brown rice.  Drink plenty of water daily.  Decrease saturated fat in the diet, avoid lots of red meat, processed foods, sweets, fast foods, and fried foods.  Exercise:  Aerobic exercise helps maintain good heart health. At least 30-40 minutes of moderate-intensity exercise is recommended. For example, a brisk walk that increases your heart rate and breathing. This should be done on most days of the week.   Find a type of exercise or a variety of exercises that you enjoy so that it becomes a part of your daily life.  Examples are running, walking, swimming, water aerobics, and biking.  For motivation and support, explore group exercise such as aerobic class, spin class, Zumba, Yoga,or  martial arts, etc.    Set exercise goals for yourself, such as a certain weight goal, walk or run in a race such as a 5k walk/run.  Speak to your primary care provider about exercise goals.  Disease prevention:  If you smoke or chew tobacco, find out from your caregiver how to quit. It  can literally save your life, no matter how long you have been a tobacco user. If you do not use tobacco, never begin.   Maintain a healthy diet and normal weight. Increased weight leads to problems with blood pressure and diabetes.   The Body Mass Index or BMI is a way of measuring how much of your body is fat. Having a BMI above 27 increases the risk of heart disease, diabetes, hypertension, stroke and  other problems related to obesity. Your caregiver can help determine your BMI and based on it develop an exercise and dietary program to help you achieve or maintain this important measurement at a healthful level.  High blood pressure causes heart and blood vessel problems.  Persistent high blood pressure should be treated with medicine if weight loss and exercise do not work.   Fat and cholesterol leaves deposits in your arteries that can block them. This causes heart disease and vessel disease elsewhere in your body.  If your cholesterol is found to be high, or if you have heart disease or certain other medical conditions, then you may need to have your cholesterol monitored frequently and be treated with medication.   Ask if you should have a cardiac stress test if your history suggests this. A stress test is a test done on a treadmill that looks for heart disease. This test can find disease prior to there being a problem.  Osteoporosis is a disease in which the bones lose minerals and strength as we age. This can result in serious bone fractures. Risk of osteoporosis can be identified using a bone density scan. Men ages 35 and over should discuss this with their caregivers. Ask your caregiver whether you should be taking a calcium supplement and Vitamin D, to reduce the rate of osteoporosis.   Avoid drinking alcohol in excess (more than two drinks per day).  Avoid use of street drugs. Do not share needles with anyone. Ask for professional help if you need assistance or instructions on stopping the use of alcohol, cigarettes, and/or drugs.  Brush your teeth twice a day with fluoride toothpaste, and floss once a day. Good oral hygiene prevents tooth decay and gum disease. The problems can be painful, unattractive, and can cause other health problems. Visit your dentist for a routine oral and dental check up and preventive care every 6-12 months.   Look at your skin regularly.  Use a mirror to look  at your back. Notify your caregivers of changes in moles, especially if there are changes in shapes, colors, a size larger than a pencil eraser, an irregular border, or development of new moles.  Safety:  Use seatbelts 100% of the time, whether driving or as a passenger.  Use safety devices such as hearing protection if you work in environments with loud noise or significant background noise.  Use safety glasses when doing any work that could send debris in to the eyes.  Use a helmet if you ride a bike or motorcycle.  Use appropriate safety gear for contact sports.  Talk to your caregiver about gun safety.  Use sunscreen with a SPF (or skin protection factor) of 15 or greater.  Lighter skinned people are at a greater risk of skin cancer. Don't forget to also wear sunglasses in order to protect your eyes from too much damaging sunlight. Damaging sunlight can accelerate cataract formation.   Practice safe sex. Use condoms. Condoms are used for birth control and to help reduce the spread of sexually  transmitted infections (or STIs).  Some of the STIs are gonorrhea (the clap), chlamydia, syphilis, trichomonas, herpes, HPV (human papilloma virus) and HIV (human immunodeficiency virus) which causes AIDS. The herpes, HIV and HPV are viral illnesses that have no cure. These can result in disability, cancer and death.   Keep carbon monoxide and smoke detectors in your home functioning at all times. Change the batteries every 6 months or use a model that plugs into the wall.   Vaccinations:  Stay up to date with your tetanus shots and other required immunizations. You should have a booster for tetanus every 10 years. Be sure to get your flu shot every year, since 5%-20% of the [place] population comes down with the flu. The flu vaccine changes each year, so being vaccinated once is not enough. Get your shot in the fall, before the flu season peaks.   Other vaccines to consider:  Human Papilloma Virus or HPV  causes cancer of the cervix, and other infections that can be transmitted from person to person. There is a vaccine for HPV, and males should get immunized between the ages of 53 and 79. It requires a series of 3 shots.   Pneumococcal vaccine to protect against certain types of pneumonia.  This is normally recommended for adults age 23 or older.  However, adults younger than 36 years old with certain underlying conditions such as diabetes, heart or lung disease should also receive the vaccine.  Shingles vaccine to protect against Varicella Zoster if you are older than age 37, or younger than 36 years old with certain underlying illness.  If you have not had the Shingrix vaccine, please call your insurer to inquire about coverage for the Shingrix vaccine given in 2 doses.   Some insurers cover this vaccine after age 83, some cover this after age 85.  If your insurer covers this, then call to schedule appointment to have this vaccine here  Hepatitis A vaccine to protect against a form of infection of the liver by a virus acquired from food.  Hepatitis B vaccine to protect against a form of infection of the liver by a virus acquired from blood or body fluids, particularly if you work in health care.  If you plan to travel internationally, check with your local health department for specific vaccination recommendations.   What should I know about cancer screening? Many types of cancers can be detected early and may often be prevented. Lung Cancer  You should be screened every year for lung cancer if: ? You are a current smoker who has smoked for at least 30 years. ? You are a former smoker who has quit within the past 15 years.  Talk to your health care provider about your screening options, when you should start screening, and how often you should be screened.  Colorectal Cancer  Routine colorectal cancer screening usually begins at 36 years of age and should be repeated every 5-10 years until  you are 36 years old. You may need to be screened more often if early forms of precancerous polyps or small growths are found. Your health care provider may recommend screening at an earlier age if you have risk factors for colon cancer.  Your health care provider may recommend using home test kits to check for hidden blood in the stool.  A small camera at the end of a tube can be used to examine your colon (sigmoidoscopy or colonoscopy). This checks for the earliest forms of colorectal cancer.  Prostate and Testicular Cancer  Depending on your age and overall health, your health care provider may do certain tests to screen for prostate and testicular cancer.  Talk to your health care provider about any symptoms or concerns you have about testicular or prostate cancer.  Skin Cancer  Check your skin from head to toe regularly.  Tell your health care provider about any new moles or changes in moles, especially if: ? There is a change in a mole's size, shape, or color. ? You have a mole that is larger than a pencil eraser.  Always use sunscreen. Apply sunscreen liberally and repeat throughout the day.  Protect yourself by wearing long sleeves, pants, a wide-brimmed hat, and sunglasses when outside.

## 2017-11-06 NOTE — Progress Notes (Signed)
Subjective: Chief Complaint  Patient presents with  . CPE    CPE     Medical team: Dr. Chilton Si, cardiology Dr. Vida Rigger, GI Dentist and eye doctor Frederica Chrestman, Kermit Balo, PA-C here for primary care  Concerns Here for physical  Had sleep study in hospital last night, per cardiology orders  Reviewed their medical, surgical, family, social, medication, and allergy history and updated chart as appropriate.  Past Medical History:  Diagnosis Date  . Allergy   . CHF (congestive heart failure) (HCC) 2018  . Cholelithiasis 2018   cholecystectomy with complication, drain   . Dry skin    nasal folds  . Hypertension   . Thyroid disease    hyperactive in high school, had oral therapy.    . Wears contact lenses     Past Surgical History:  Procedure Laterality Date  . APPENDECTOMY    . CHOLECYSTECTOMY N/A 12/03/2016   Procedure: LAPAROSCOPIC CHOLECYSTECTOMY;  Surgeon: Violeta Gelinas, MD;  Location: [facility] OR;  Service: General;  Laterality: N/A;  . ERCP N/A 12/19/2016   Procedure: ENDOSCOPIC RETROGRADE CHOLANGIOPANCREATOGRAPHY (ERCP);  Surgeon: Vida Rigger, MD;  Location: [facility] ENDOSCOPY;  Service: Endoscopy;  Laterality: N/A;  . ESOPHAGOGASTRODUODENOSCOPY (EGD) WITH PROPOFOL N/A 12/13/2016   Procedure: ESOPHAGOGASTRODUODENOSCOPY (EGD) WITH PROPOFOL;  Surgeon: Willis Modena, MD;  Location: [facility] ENDOSCOPY;  Service: Endoscopy;  Laterality: N/A;  . ESOPHAGOGASTRODUODENOSCOPY (EGD) WITH PROPOFOL N/A 12/15/2016   Procedure: ESOPHAGOGASTRODUODENOSCOPY (EGD) WITH PROPOFOL;  Surgeon: Willis Modena, MD;  Location: [facility] ENDOSCOPY;  Service: Endoscopy;  Laterality: N/A;  . ESOPHAGOGASTRODUODENOSCOPY (EGD) WITH PROPOFOL N/A 04/11/2017   Procedure: ESOPHAGOGASTRODUODENOSCOPY (EGD) WITH PROPOFOL;  Surgeon: Vida Rigger, MD;  Location: [facility] ENDOSCOPY;  Service: Endoscopy;  Laterality: N/A;  side viewing scope- biliary stent removal  . IR CATHETER TUBE CHANGE  12/23/2016  . IR RADIOLOGIST EVAL & MGMT   01/03/2017  . IR RADIOLOGIST EVAL & MGMT  01/17/2017  . IR RADIOLOGIST EVAL & MGMT  01/25/2017  . IR RADIOLOGIST EVAL & MGMT  02/09/2017  . IR RADIOLOGIST EVAL & MGMT  02/21/2017  . IR THORACENTESIS ASP PLEURAL SPACE W/IMG GUIDE  12/23/2016    Social History   Socioeconomic History  . Marital status: Married    Spouse name: Not on file  . Number of children: Not on file  . Years of education: Not on file  . Highest education level: Not on file  Occupational History  . Not on file  Social Needs  . Financial resource strain: Not on file  . Food insecurity:    Worry: Not on file    Inability: Not on file  . Transportation needs:    Medical: Not on file    Non-medical: Not on file  Tobacco Use  . Smoking status: Never Smoker  . Smokeless tobacco: Never Used  Substance and Sexual Activity  . Alcohol use: Yes    Alcohol/week: 4.0 standard drinks    Types: 3 Cans of beer, 1 Shots of liquor per week    Comment: occ, history of 2-3 years of heavier alcohol  . Drug use: No  . Sexual activity: Not on file  Lifestyle  . Physical activity:    Days per week: Not on file    Minutes per session: Not on file  . Stress: Not on file  Relationships  . Social connections:    Talks on phone: Not on file    Gets together: Not on file    Attends religious service: Not on file  Active member of club or organization: Not on file    Attends meetings of clubs or organizations: Not on file    Relationship status: Not on file  . Intimate partner violence:    Fear of current or ex partner: Not on file    Emotionally abused: Not on file    Physically abused: Not on file    Forced sexual activity: Not on file  Other Topics Concern  . Not on file  Social History Narrative   Lives at home with wife and 1yo son. Son's birthday end of September.   Exercise - some jogging 2-3 times per week.    Considering joining gym.   10/2016    Family History  Problem Relation Age of Onset  . Hypertension  Mother   . Cataracts Mother   . Hyperlipidemia Father   . Pancreatic cancer Maternal Grandmother   . Cancer Maternal Grandmother   . Diabetes Other   . Heart disease Neg Hx   . Stroke Neg Hx      Current Outpatient Medications:  .  acetaminophen (TYLENOL) 325 MG tablet, Take 325-650 mg by mouth every 6 (six) hours as needed for mild pain, moderate pain or headache. , Disp: , Rfl:  .  losartan (COZAAR) 25 MG tablet, Take 1 tablet (25 mg total) by mouth daily., Disp: 90 tablet, Rfl: 3 .  metoprolol succinate (TOPROL-XL) 50 MG 24 hr tablet, Take 1 tablet (50 mg total) by mouth daily. Take with or immediately following a meal., Disp: 90 tablet, Rfl: 3 .  nitroGLYCERIN (NITROSTAT) 0.4 MG SL tablet, Place 1 tablet under tongue every 5 minutes prn for chest pain up to 2 times and if no improvement, go to the emergency room, Disp: 50 tablet, Rfl: 3 .  Propylene Glycol (SYSTANE COMPLETE OP), Place 1-2 drops into both eyes daily., Disp: , Rfl:   No Known Allergies     Review of Systems Constitutional: -fever, -chills, -sweats, -unexpected weight change, -decreased appetite, -fatigue Allergy: -sneezing, -itching, -congestion Dermatology: -changing moles, --rash, -lumps ENT: -runny nose, -ear pain, -sore throat, -hoarseness, -sinus pain, -teeth pain, - ringing in ears, -hearing loss, -nosebleeds Cardiology: -chest pain, -palpitations, -swelling, -difficulty breathing when lying flat, -waking up short of breath Respiratory: -cough, -shortness of breath, -difficulty breathing with exercise or exertion, -wheezing, -coughing up blood Gastroenterology: -abdominal pain, -nausea, -vomiting, -diarrhea, -constipation, -blood in stool, -changes in bowel movement, -difficulty swallowing or eating Hematology: -bleeding, -bruising  Musculoskeletal: -joint aches, -muscle aches, -joint swelling, -back pain, -neck pain, -cramping, -changes in gait Ophthalmology: denies vision changes, eye redness, itching,  discharge Urology: -burning with urination, -difficulty urinating, -blood in urine, -urinary frequency, -urgency, -incontinence Neurology: -headache, -weakness, -tingling, -numbness, -memory loss, -falls, -dizziness Psychology: -depressed mood, -agitation, -sleep problems Male GU: no testicular mass, pain, no lymph nodes swollen, no swelling, no rash.     Objective:  BP 120/70   Pulse 76   Temp 97.8 F (36.6 C) (Oral)   Resp 16   Ht 5\' 5"  (1.651 m)   Wt 216 lb 12.8 oz (98.3 kg)   SpO2 97%   BMI 36.08 kg/m   General appearance: alert, no distress, WD/WN, Hispanic male Skin: scattered macules, no worrisome lesions HEENT: normocephalic, conjunctiva/corneas normal, sclerae anicteric, PERRLA, EOMi, nares patent, no discharge or erythema, pharynx normal Oral cavity: MMM, tongue normal, teeth in good repair Neck: supple, no lymphadenopathy, no thyromegaly, no masses, normal ROM, no bruits Chest: non tender, normal shape and expansion Heart: RRR,  normal S1, S2, no murmurs Lungs: CTA bilaterally, no wheezes, rhonchi, or rales Abdomen: +bs, soft, port surgical scars upper and right upper abdomen as well as right lower quadrant appendectomy surgical scar, non tender, non distended, no masses, no hepatomegaly, no splenomegaly, no bruits Back: non tender, normal ROM, no scoliosis Musculoskeletal: upper extremities non tender, no obvious deformity, normal ROM throughout, lower extremities non tender, no obvious deformity, normal ROM throughout Extremities: no edema, no cyanosis, no clubbing Pulses: 2+ symmetric, upper and lower extremities, normal cap refill Neurological: alert, oriented x 3, CN2-12 intact, strength normal upper extremities and lower extremities, sensation normal throughout, DTRs 2+ throughout, no cerebellar signs, gait normal Psychiatric: normal affect, behavior normal, pleasant  GU: normal male external genitalia, nontender, no masses, no hernia, no lymphadenopathy Rectal:  deferred  Assessment and Plan :   Encounter Diagnoses  Name Primary?  . Routine general medical examination at a health care facility Yes  . Need for influenza vaccination   . Class 2 severe obesity with serious comorbidity and body mass index (BMI) of 36.0 to 36.9 in adult, unspecified obesity type (HCC)   . S/P cholecystectomy   . Vaccine counseling   . Impaired fasting blood sugar     Physical exam - discussed and counseled on healthy lifestyle, diet, exercise, preventative care, vaccinations, sick and well care, proper use of emergency dept and after hours care, and addressed their concerns.    Health screening: See your eye doctor yearly for routine vision care. See your dentist yearly for routine dental care including hygiene visits twice yearly.  Cancer screening Advised monthly self testicular exam  Vaccinations: Advised yearly influenza vaccine Counseled on the influenza virus vaccine.  Vaccine information sheet given.  Influenza vaccine given after consent obtained.  He will return at his convenience for tetanus booster Tdap as well as pneumococcal 23 vaccine  Acute issues discussed: none  Separate significant chronic issues discussed: F/u with sleep study results per cardiology  Work on lifestyle changes, weight loss  Reviewed recent cardiollgy notes and recent ED visit for chest pain  Marckus was seen today for cpe.  Diagnoses and all orders for this visit:  Routine general medical examination at a health care facility -     POCT Urinalysis DIP (Proadvantage Device) -     Hemoglobin A1c -     Lipid panel -     TSH  Need for influenza vaccination -     Flu Vaccine QUAD 6+ mos PF IM (Fluarix Quad PF)  Class 2 severe obesity with serious comorbidity and body mass index (BMI) of 36.0 to 36.9 in adult, unspecified obesity type (HCC) -     Hemoglobin A1c  S/P cholecystectomy  Vaccine counseling  Impaired fasting blood sugar    Follow-up pending  labs, yearly for physical

## 2017-11-07 LAB — LIPID PANEL
Chol/HDL Ratio: 3 ratio (ref 0.0–5.0)
Cholesterol, Total: 175 mg/dL (ref 100–199)
HDL: 59 mg/dL (ref >39)
LDL CALC: 95 mg/dL (ref 0–99)
Triglycerides: 104 mg/dL (ref 0–149)
VLDL CHOLESTEROL CAL: 21 mg/dL (ref 5–40)

## 2017-11-07 LAB — HEMOGLOBIN A1C
Est. average glucose Bld gHb Est-mCnc: 114 mg/dL
Hgb A1c MFr Bld: 5.6 % (ref 4.8–5.6)

## 2017-11-07 LAB — TSH: TSH: 0.552 u[IU]/mL (ref 0.450–4.500)

## 2017-11-16 ENCOUNTER — Encounter (HOSPITAL_BASED_OUTPATIENT_CLINIC_OR_DEPARTMENT_OTHER): Payer: Self-pay | Admitting: Cardiovascular Disease

## 2017-11-16 ENCOUNTER — Other Ambulatory Visit: Payer: Self-pay | Admitting: Cardiology

## 2017-11-16 NOTE — Procedures (Signed)
Patient Name: Bobby Kelly, Bobby Kelly Date: 11/05/2017 Gender: Male D.O.B: 08-Mar-1980 Age (years): 36 Referring Provider: Chilton Si Height (inches): 65 Interpreting Physician: Nicki Guadalajara MD, ABSM Weight (lbs): 215 RPSGT: Cherylann Parr BMI: 36 MRN: [id number] Neck Size: 18.50  CLINICAL INFORMATION Sleep Study Type: NPSG  Indication for sleep study: Congestive Heart Failure, Fatigue, Snoring, Witnesses Apnea / Gasping During Sleep  Epworth Sleepiness Score: 6  SLEEP STUDY TECHNIQUE As per the AASM Manual for the Scoring of Sleep and Associated Events v2.3 (April 2016) with a hypopnea requiring 4% desaturations.  The channels recorded and monitored were frontal, central and occipital EEG, electrooculogram (EOG), submentalis EMG (chin), nasal and oral airflow, thoracic and abdominal wall motion, anterior tibialis EMG, snore microphone, electrocardiogram, and pulse oximetry.  MEDICATIONS     acetaminophen (TYLENOL) 325 MG tablet             losartan (COZAAR) 25 MG tablet         metoprolol succinate (TOPROL-XL) 50 MG 24 hr tablet         nitroGLYCERIN (NITROSTAT) 0.4 MG SL tablet         Propylene Glycol (SYSTANE COMPLETE OP)      Medications self-administered by patient taken the night of the study : N/A  SLEEP ARCHITECTURE The study was initiated at 10:57:21 PM and ended at 5:06:33 AM.  Sleep onset time was 8.6 minutes and the sleep efficiency was 85.3%%. The total sleep time was 315.1 minutes.  Stage REM latency was 103.0 minutes.  The patient spent 5.1%% of the night in stage N1 sleep, 82.4%% in stage N2 sleep, 0.0%% in stage N3 and 12.5% in REM.  Alpha intrusion was absent.  Supine sleep was 56.90%.  RESPIRATORY PARAMETERS The overall apnea/hypopnea index (AHI) was 32.6 per hour. There were 132 total apneas, including 132 obstructive, 0 central and 0 mixed apneas. There were 39 hypopneas and 0 RERAs.  The AHI during Stage REM sleep was 42.5 per  hour.  AHI while supine was 39.8 per hour.  The mean oxygen saturation was 94.6%. The minimum SpO2 during sleep was 80.0%.  Loud snoring was noted during this study.  CARDIAC DATA The 2 lead EKG demonstrated sinus rhythm. The mean heart rate was 66.7 beats per minute. Other EKG findings include: None.  LEG MOVEMENT DATA The total PLMS were 0 with a resulting PLMS index of 0.0. Associated arousal with leg movement index was 0.0 .  IMPRESSIONS - Severe obstructive sleep apnea occurred (AHI 32.6/h). Events were worse with supine sleep (AHI 39.8/h) and REM sleep (AHI 42.5/h). - No significant central sleep apnea occurred during this study (CAI = 0.0/h). - Significant oxygen desaturation to a nadir of 80.0%. - The patient snored with loud snoring volume. - No cardiac abnormalities were noted during this study. - Clinically significant periodic limb movements did not occur during sleep. No significant associated arousals.  DIAGNOSIS - Obstructive Sleep Apnea (327.23 [G47.33 ICD-10]) - Nocturnal Hypoxemia (327.26 [G47.36 ICD-10])  RECOMMENDATIONS - Therapeutic CPAP titration to determine optimal pressure required to alleviate sleep disordered breathing. - Efforts should be made to optimize nasal and oropharyngea patency. - Positional therapy avoiding supine position during sleep. - Avoid alcohol, sedatives and other CNS depressants that may worsen sleep apnea and disrupt normal sleep architecture. - Sleep hygiene should be reviewed to assess factors that may improve sleep quality. - Weight management (BMI 36) and regular exercise should be initiated or continued if appropriate.  [Electronically signed] 11/16/2017 05:28  PM  Shelva Majestic MD, [facility], [place], American Board of Sleep Medicine   NPI: [id number] [place] PH: [phone]   FX: [phone] [place]

## 2017-11-17 ENCOUNTER — Other Ambulatory Visit: Payer: Self-pay | Admitting: Cardiovascular Disease

## 2017-11-17 ENCOUNTER — Telehealth: Payer: Self-pay | Admitting: *Deleted

## 2017-11-17 DIAGNOSIS — G4733 Obstructive sleep apnea (adult) (pediatric): Secondary | ICD-10-CM

## 2017-11-17 NOTE — Telephone Encounter (Signed)
Patient notified of sleep study results and Dr Landry Dyke recommendations for treatment. Patient is in agreement and wishes to proceed.

## 2017-11-17 NOTE — Telephone Encounter (Signed)
-----   Message from Lennette Bihari, MD sent at 11/16/2017  5:33 PM EDT ----- Bobby Kelly please notify pt and schedule for CPAP titration study.

## 2017-11-21 ENCOUNTER — Telehealth: Payer: Self-pay | Admitting: *Deleted

## 2017-11-21 NOTE — Telephone Encounter (Signed)
Patient notified of CPAP titration study scheduled on 12/29/17 @ [facility].

## 2017-12-29 ENCOUNTER — Ambulatory Visit (HOSPITAL_BASED_OUTPATIENT_CLINIC_OR_DEPARTMENT_OTHER)
Payer: No Typology Code available for payment source | Attending: Cardiovascular Disease | Admitting: Cardiovascular Disease

## 2017-12-29 VITALS — Ht 65.0 in | Wt 218.0 lb

## 2017-12-29 DIAGNOSIS — Z79899 Other long term (current) drug therapy: Secondary | ICD-10-CM | POA: Diagnosis not present

## 2017-12-29 DIAGNOSIS — G4733 Obstructive sleep apnea (adult) (pediatric): Secondary | ICD-10-CM | POA: Insufficient documentation

## 2018-01-02 ENCOUNTER — Encounter: Payer: Self-pay | Admitting: Cardiovascular Disease

## 2018-01-02 ENCOUNTER — Ambulatory Visit (INDEPENDENT_AMBULATORY_CARE_PROVIDER_SITE_OTHER): Payer: PRIVATE HEALTH INSURANCE | Admitting: Cardiovascular Disease

## 2018-01-02 VITALS — BP 122/84 | HR 104 | Ht 65.0 in | Wt 226.4 lb

## 2018-01-02 DIAGNOSIS — I5032 Chronic diastolic (congestive) heart failure: Secondary | ICD-10-CM

## 2018-01-02 DIAGNOSIS — I10 Essential (primary) hypertension: Secondary | ICD-10-CM

## 2018-01-02 DIAGNOSIS — R0789 Other chest pain: Secondary | ICD-10-CM

## 2018-01-02 DIAGNOSIS — G4733 Obstructive sleep apnea (adult) (pediatric): Secondary | ICD-10-CM

## 2018-01-02 NOTE — Patient Instructions (Signed)
Medication Instructions:  Your physician recommends that you continue on your current medications as directed. Please refer to the Current Medication list given to you today.  If you need a refill on your cardiac medications before your next appointment, please call your pharmacy.   Lab work: NONE  Testing/Procedures: NONE   Follow-Up: At [facility], you and your health needs are our priority.  As part of our continuing mission to provide you with exceptional heart care, we have created designated Provider Care Teams.  These Care Teams include your primary Cardiologist (physician) and Advanced Practice Providers (APPs -  Physician Assistants and Nurse Practitioners) who all work together to provide you with the care you need, when you need it. You will need a follow up appointment in 12 months.  Please call our office 2 months in advance to schedule this appointment.  You may see DR [place]  or one of the following Advanced Practice Providers on your designated Care Team:   Luke Kilroy, PA-C Krista Kroeger, PA-C . Callie Goodrich, PA-C    

## 2018-01-02 NOTE — Progress Notes (Signed)
Cardiology Office Note   Date:  01/07/2018   ID:  Bobby Kelly, DOB 05-20-1980, MRN [id number]  PCP:  Bobby Canavan, PA-C  Cardiologist:   Chilton Si, MD   No chief complaint on file.    History of Present Illness: Bobby Kelly is a 37 y.o. male with chronic systolic and diastolic heart failure who is being seen today for the evaluation of heart failure and chest pain at the request of Bobby Canavan, PA-C.  [name] first saw [name] 08/2016 when he was referred for tachycardia, chest pain, and shortness of breath.  He was noted to have episodes of tachycardia to the 100s on his Fitbit.  His heart rate increased to the 130s with minimal exertion.  He had recently been started on antihypertensives by his primary care provider.  [name] noted that he had episodes of sinus tachycardia.  He had him wear a 48-hour Holter that did not reveal any other significant arrhythmias.  He had an exercise Myoview 09/12/2016 that revealed LVEF 44% with a concern for inferior infarct with peri-infarct ischemia versus diaphragmatic attenuation.  [name] was seen in the ED 09/26/2017 with dyspnea.  He became short of breath and dizzy when driving home.  Cardiac enzymes were negative and his EKG was he was then referred without ischemic changes.  Prior to that he saw Dr. Susann Kelly on 09/25/17 with chest discomfort and palpitations.  He had an echocardiogram 09/19/2016 that revealed LVEF 35 to 40% with grade 2 diastolic dysfunction.  Hydrochlorothiazide and amlodipine were switched to losartan and metoprolol.  He had a repeat echocardiogram 04/2017 that revealed improvement in his LVEF to 50 to 55%.  He followed up with [name] 09/27/2017 and losartan was reduced due to low blood pressures.    At his last appointment Mr. Bobby Kelly was referred for sleep study and was found to have OSA.  He has an upcoming CPAP titration study.  Mr. Bobby Kelly continues to have intermittent chest pain.  Two weeks ago he noted  chest pain when sitting down.  It lasted consistently for a few days.   He hadn't been doing anything strenous and it felt lke a pinching sensation. It was worse when sneezing or moving quickly.  There was no associated shortness of breath, nausea or diaphoresis.  He notices some shortness of breath when sleeping or when he first wakes up.  He doesn't feel rested in the morning.  He isn't getting any exercise outside of work.  [name] denies lower extremity edema, orthopnea or PND.   Past Medical History:  Diagnosis Date  . Allergy   . CHF (congestive heart failure) (HCC) 2018  . Cholelithiasis 2018   cholecystectomy with complication, drain   . Dry skin    nasal folds  . Hypertension   . Thyroid disease    hyperactive in high school, had oral therapy.    . Wears contact lenses     Past Surgical History:  Procedure Laterality Date  . APPENDECTOMY    . CHOLECYSTECTOMY N/A 12/03/2016   Procedure: LAPAROSCOPIC CHOLECYSTECTOMY;  Surgeon: Bobby Gelinas, MD;  Location: [facility] OR;  Service: General;  Laterality: N/A;  . ERCP N/A 12/19/2016   Procedure: ENDOSCOPIC RETROGRADE CHOLANGIOPANCREATOGRAPHY (ERCP);  Surgeon: Bobby Rigger, MD;  Location: [facility] ENDOSCOPY;  Service: Endoscopy;  Laterality: N/A;  . ESOPHAGOGASTRODUODENOSCOPY (EGD) WITH PROPOFOL N/A 12/13/2016   Procedure: ESOPHAGOGASTRODUODENOSCOPY (EGD) WITH PROPOFOL;  Surgeon: Bobby Modena, MD;  Location: [facility] ENDOSCOPY;  Service: Endoscopy;  Laterality: N/A;  . ESOPHAGOGASTRODUODENOSCOPY (EGD) WITH PROPOFOL N/A 12/15/2016   Procedure: ESOPHAGOGASTRODUODENOSCOPY (EGD) WITH PROPOFOL;  Surgeon: Bobby Modena, MD;  Location: [facility] ENDOSCOPY;  Service: Endoscopy;  Laterality: N/A;  . ESOPHAGOGASTRODUODENOSCOPY (EGD) WITH PROPOFOL N/A 04/11/2017   Procedure: ESOPHAGOGASTRODUODENOSCOPY (EGD) WITH PROPOFOL;  Surgeon: Bobby Rigger, MD;  Location: [facility] ENDOSCOPY;  Service: Endoscopy;  Laterality: N/A;  side viewing scope- biliary stent removal  . IR CATHETER  TUBE CHANGE  12/23/2016  . IR RADIOLOGIST EVAL & MGMT  01/03/2017  . IR RADIOLOGIST EVAL & MGMT  01/17/2017  . IR RADIOLOGIST EVAL & MGMT  01/25/2017  . IR RADIOLOGIST EVAL & MGMT  02/09/2017  . IR RADIOLOGIST EVAL & MGMT  02/21/2017  . IR THORACENTESIS ASP PLEURAL SPACE W/IMG GUIDE  12/23/2016     Current Outpatient Medications  Medication Sig Dispense Refill  . acetaminophen (TYLENOL) 325 MG tablet Take 325-650 mg by mouth every 6 (six) hours as needed for mild pain, moderate pain or headache.     . losartan (COZAAR) 25 MG tablet Take 1 tablet (25 mg total) by mouth daily. 90 tablet 3  . metoprolol succinate (TOPROL-XL) 50 MG 24 hr tablet TAKE 1 TABLET BY MOUTH DAILY WITH OR IMMEDIATELY FOLLOWING A MEAL 90 tablet 3  . nitroGLYCERIN (NITROSTAT) 0.4 MG SL tablet Place 1 tablet under tongue every 5 minutes prn for chest pain up to 2 times and if no improvement, go to the emergency room 50 tablet 3  . Propylene Glycol (SYSTANE COMPLETE OP) Place 1-2 drops into both eyes daily.     No current facility-administered medications for this visit.     Allergies:   Patient has no known allergies.    Social History:  The patient  reports that he has never smoked. He has never used smokeless tobacco. He reports that he drinks about 4.0 standard drinks of alcohol per week. He reports that he does not use drugs.   Family History:  The patient's family history includes Cancer in his maternal grandmother; Cataracts in his mother; Diabetes in his other; Hyperlipidemia in his father; Hypertension in his mother; Pancreatic cancer in his maternal grandmother.    ROS:  Please see the history of present illness.   Otherwise, review of systems are positive for none.   All other systems are reviewed and negative.    PHYSICAL EXAM: VS:  BP 122/84   Pulse (!) 104   Ht 5\' 5"  (1.651 m)   Wt 226 lb 6.4 oz (102.7 kg)   SpO2 99%   BMI 37.67 kg/m  , BMI Body mass index is 37.67 kg/m. GENERAL:  Well  appearing HEENT:  Pupils equal round and reactive, fundi not visualized, oral mucosa unremarkable NECK:  No jugular venous distention, waveform within normal limits, carotid upstroke brisk and symmetric, no bruits LUNGS:  Clear to auscultation bilaterally HEART:  RRR.  PMI not displaced or sustained,S1 and S2 within normal limits, no S3, no S4, no clicks, no rubs, no murmurs ABD:  Flat, positive bowel sounds normal in frequency in pitch, no bruits, no rebound, no guarding, no midline pulsatile mass, no hepatomegaly, no splenomegaly EXT:  2 plus pulses throughout, no edema, no cyanosis no clubbing SKIN:  No rashes no nodules NEURO:  Cranial nerves II through XII grossly intact, motor grossly intact throughout PSYCH:  Cognitively intact, oriented to person place and time    EKG:  EKG is ordered today. The ekg ordered today demonstrates sinus rhythm.  Rate 89 bpm.  Lexiscan Myoview 09/2016:  Nuclear stress EF: 44%.  No significant change in nonspecific ST abnormality during infusion.  There is a small defect of mild severity present in the basal inferior and mid inferior location. The defect is partially reversible and consistent with infarct with peri infarct ischemia vs. Variations in diaphragmatic attenuation artifact.  The left ventricular ejection fraction is moderately decreased (30-44%).  This is an intermediate risk study.  Recommend 2D echo for further assessment of LVF and inferior wall motion.   Echo 09/19/16: Study Conclusions  - Left ventricle: The cavity size was normal. Wall thickness was   normal. Systolic function was moderately reduced. The estimated   ejection fraction was in the range of 35% to 40%. Features are   consistent with a pseudonormal left ventricular filling pattern,   with concomitant abnormal relaxation and increased filling   pressure (grade 2 diastolic dysfunction). - Right atrium: The atrium was mildly dilated.  Echo 04/10/17: Study  Conclusions  - Left ventricle: The cavity size was normal. Wall thickness was   normal. Systolic function was normal. The estimated ejection   fraction was in the range of 50% to 55%. Wall motion was normal;   there were no regional wall motion abnormalities. The study is   not technically sufficient to allow evaluation of LV diastolic   function. - Mitral valve: Calcified annulus. Mildly thickened leaflets .   Recent Labs: 09/26/2017: B Natriuretic Peptide 9.6; BUN 13; Creatinine, Ser 0.93; Hemoglobin 16.2; Platelets 281; Potassium 3.8; Sodium 138 11/06/2017: TSH 0.552    Lipid Panel    Component Value Date/Time   CHOL 175 11/06/2017 0908   TRIG 104 11/06/2017 0908   HDL 59 11/06/2017 0908   CHOLHDL 3.0 11/06/2017 0908   CHOLHDL 3.3 11/03/2016 0811   LDLCALC 95 11/06/2017 0908   LDLCALC 94 11/03/2016 0811      Wt Readings from Last 3 Encounters:  01/02/18 226 lb 6.4 oz (102.7 kg)  12/29/17 218 lb (98.9 kg)  11/06/17 216 lb 12.8 oz (98.3 kg)      ASSESSMENT AND PLAN:  # Atypical chest pain: Symptoms are atypical.  He had a negative stress test less than 1 year ago an no coronary calcification was noted on his chest CT 09/2017.  Therefore, it is unlikely that his symptoms are due to ischemia.  He had some mild chest wall tenderness to palpation.  No plans for any additional ischemia evaluation at this time.  # Hypertension: Losartan was recently reduced. BP remains stable.  Continue losartan and metoprolol.   # Obestiy:  Encouraged to increase exercise to at least 150 minutes per week.   # OSA: Mr. Ker was encouraged to keep up with his CPAP titration.  # Chronic systolic and diastolic heart failure:  LVEF improved to 50-55% on most recent assessment.  It is possible it was tachycardia induced.  Continue metoprolol and losartan.   Current medicines are reviewed at length with the patient today.  The patient does not have concerns regarding medicines.  The following  changes have been made:  no change  Labs/ tests ordered today include:   No orders of the defined types were placed in this encounter.    Disposition:   FU with Graylyn Bunney C. Duke Salvia, MD, [facility] in 1 year   Signed, Brucha Ahlquist C. Duke Salvia, MD, [facility]  01/07/2018 7:47 AM    [facility]

## 2018-01-07 ENCOUNTER — Encounter (HOSPITAL_BASED_OUTPATIENT_CLINIC_OR_DEPARTMENT_OTHER): Payer: Self-pay | Admitting: Cardiovascular Disease

## 2018-01-07 ENCOUNTER — Encounter: Payer: Self-pay | Admitting: Cardiovascular Disease

## 2018-01-07 NOTE — Procedures (Signed)
Patient Name: Bobby Kelly, Bobby Kelly Date: 12/29/2017 Gender: Male D.O.B: [birth year] Age (years): 36 Referring Provider: Chilton Si Height (inches): 65 Interpreting Physician: Nicki Guadalajara MD, ABSM Weight (lbs): 215 RPSGT: Lise Auer BMI: 36 MRN: [id number] Neck Size: 18.50  CLINICAL INFORMATION The patient is referred for a CPAP titration to treat sleep apnea.  Date of NPSG:  11/05/2017:  AHI 32.6/h; RDI 32.6/h; REM sleep AHI 42.5/f; supine sleep AHI 39.8/h; O2 desaturation 80%.  SLEEP STUDY TECHNIQUE As per the AASM Manual for the Scoring of Sleep and Associated Events v2.3 (April 2016) with a hypopnea requiring 4% desaturations.  The channels recorded and monitored were frontal, central and occipital EEG, electrooculogram (EOG), submentalis EMG (chin), nasal and oral airflow, thoracic and abdominal wall motion, anterior tibialis EMG, snore microphone, electrocardiogram, and pulse oximetry. Continuous positive airway pressure (CPAP) was initiated at the beginning of the study and titrated to treat sleep-disordered breathing.  MEDICATIONS      acetaminophen (TYLENOL) 325 MG tablet         losartan (COZAAR) 25 MG tablet (Expired)         metoprolol succinate (TOPROL-XL) 50 MG 24 hr tablet         nitroGLYCERIN (NITROSTAT) 0.4 MG SL tablet         Propylene Glycol (SYSTANE COMPLETE OP)      Medications self-administered by patient taken the night of the study : N/A  TECHNICIAN COMMENTS Comments added by technician: Patient tolerated CPAP well Comments added by scorer: N/A  RESPIRATORY PARAMETERS Optimal PAP Pressure (cm): 16 AHI at Optimal Pressure (/hr): 0.0 Overall Minimal O2 (%): 85.0 Supine % at Optimal Pressure (%): 0 Minimal O2 at Optimal Pressure (%): 94.0   SLEEP ARCHITECTURE The study was initiated at 11:06:26 PM and ended at 5:30:45 AM.  Sleep onset time was 7.0 minutes and the sleep efficiency was 95.2%%. The total sleep time was 365.8  minutes.  The patient spent 6.0%% of the night in stage N1 sleep, 63.5%% in stage N2 sleep, 3.6%% in stage N3 and 26.9% in REM.Stage REM latency was 121.0 minutes  Wake after sleep onset was 11.5. Alpha intrusion was absent. Supine sleep was 78.96%.  CARDIAC DATA The 2 lead EKG demonstrated sinus rhythm. The mean heart rate was 75.1 beats per minute. Other EKG findings include: None.  LEG MOVEMENT DATA The total Periodic Limb Movements of Sleep (PLMS) were 0. The PLMS index was 0.0. A PLMS index of <15 is considered normal in adults.  IMPRESSIONS - CPAP was initiated at 5 cm and was titrated to optimal CPAP pressure of 16 cm of water. - Central sleep apnea was not noted during this titration (CAI = 0.5/h). - Moderate oxygen desaturations to a nadir of 85% at 8 cm of water. - The patient snored with loud snoring volume during this titration study. - No cardiac abnormalities were observed during this study. - Clinically significant periodic limb movements were not noted during this study. Arousals associated with PLMs were rare.  DIAGNOSIS - Obstructive Sleep Apnea (327.23 [G47.33 ICD-10])  RECOMMENDATIONS - Recommend an initial trial of CPAP therapy with EPR of 3 at 16 cm H2O with heated humidification.  A Medium size Resmed Full Face Mask AirFit F20 mask was used for the titration. At 16 cm:  AHI 0; O2 nadir 94%. - Effort should be made to optimize nasal and oropharyngeal patency. - Avoid alcohol, sedatives and other CNS depressants that may worsen sleep apnea and disrupt normal sleep architecture. -  Sleep hygiene should be reviewed to assess factors that may improve sleep quality. - Weight management (BMI 36) and regular exercise should be initiated or continued. - Recommend a download be obtained in 30 days and sleep clinic evaluation after 4 weeks of therapy   [Electronically signed] 01/07/2018 09:05 AM  Nicki Guadalajara MD, [facility], ABSM Diplomate, American Board of Sleep  Medicine   NPI: [id number]  [facility] PH: [phone]   FX: [phone] ACCREDITED BY THE AMERICAN ACADEMY OF SLEEP MEDICINE

## 2018-01-10 ENCOUNTER — Telehealth: Payer: Self-pay | Admitting: *Deleted

## 2018-01-10 NOTE — Telephone Encounter (Signed)
Patient notified CPAP titration complete. Referral will be made to [facility]. Once benefits are received they will contact him for set up.

## 2018-01-10 NOTE — Telephone Encounter (Signed)
-----   Message from Lennette Bihari, MD sent at 01/07/2018  9:09 AM EST ----- Burna Mortimer, please notify pt and have DME set up CPAP with f/u sleep clinic

## 2018-04-04 ENCOUNTER — Encounter: Payer: Self-pay | Admitting: Medical

## 2018-04-04 ENCOUNTER — Ambulatory Visit (INDEPENDENT_AMBULATORY_CARE_PROVIDER_SITE_OTHER): Payer: No Typology Code available for payment source | Admitting: Medical

## 2018-04-04 VITALS — BP 140/80 | HR 56 | Temp 97.7°F | Resp 16 | Ht 65.0 in | Wt 221.4 lb

## 2018-04-04 DIAGNOSIS — R42 Dizziness and giddiness: Secondary | ICD-10-CM | POA: Diagnosis not present

## 2018-04-04 DIAGNOSIS — Z9989 Dependence on other enabling machines and devices: Secondary | ICD-10-CM

## 2018-04-04 DIAGNOSIS — R5383 Other fatigue: Secondary | ICD-10-CM | POA: Diagnosis not present

## 2018-04-04 DIAGNOSIS — R079 Chest pain, unspecified: Secondary | ICD-10-CM

## 2018-04-04 DIAGNOSIS — G4733 Obstructive sleep apnea (adult) (pediatric): Secondary | ICD-10-CM | POA: Diagnosis not present

## 2018-04-04 NOTE — Progress Notes (Signed)
Subjective:     Patient ID: Bobby Kelly, male   DOB: 1980/04/24, 38 y.o.   MRN: [id number]  HPI Chief Complaint  Patient presents with  . lite headed    chest pain, dizzy, lite headed, tired numbness of both fingers X 2 weeks   Here for not feeling normal last 2 weeks.  Having some lightheadedenss, dizzy, tired a lot. Having some headaches last 2 days.  The lightheadedness can last all day.   Getting some chest pains, stays in left chest wall/close to arm pit, is a pressure.   Pain not worse with actiivty.  Has hx/o OSA, on CPAP, uses most nights.   Sometimes thinks he takes mask off in his sleep.   Currently not taking statin or aspirin.   Currently taking Losartan 25mg  and Metoprolol XL 50mg  daily.  Denies sweats, shortness of breath, nausea and vomiting, URI symptoms.  No other aggravating or relieving factors. No other complaint.   Past Medical History:  Diagnosis Date  . Allergy   . CHF (congestive heart failure) (HCC) 2018  . Cholelithiasis 2018   cholecystectomy with complication, drain   . Dry skin    nasal folds  . Hypertension   . Thyroid disease    hyperactive in high school, had oral therapy.    . Wears contact lenses    Current Outpatient Medications on File Prior to Visit  Medication Sig Dispense Refill  . losartan (COZAAR) 25 MG tablet Take 1 tablet (25 mg total) by mouth daily. 90 tablet 3  . metoprolol succinate (TOPROL-XL) 50 MG 24 hr tablet TAKE 1 TABLET BY MOUTH DAILY WITH OR IMMEDIATELY FOLLOWING A MEAL 90 tablet 3  . Propylene Glycol (SYSTANE COMPLETE OP) Place 1-2 drops into both eyes daily.    Marland Kitchen acetaminophen (TYLENOL) 325 MG tablet Take 325-650 mg by mouth every 6 (six) hours as needed for mild pain, moderate pain or headache.     . nitroGLYCERIN (NITROSTAT) 0.4 MG SL tablet Place 1 tablet under tongue every 5 minutes prn for chest pain up to 2 times and if no improvement, go to the emergency room (Patient not taking: Reported on 04/04/2018) 50 tablet 3    No current facility-administered medications on file prior to visit.      Review of Systems ROS as in subjective      Objective:   Physical Exam BP 140/80   Pulse (!) 56   Temp 97.7 F (36.5 C) (Oral)   Resp 16   Ht 5\' 5"  (1.651 m)   Wt 221 lb 6.4 oz (100.4 kg)   SpO2 99%   BMI 36.84 kg/m   Wt Readings from Last 3 Encounters:  04/04/18 221 lb 6.4 oz (100.4 kg)  01/02/18 226 lb 6.4 oz (102.7 kg)  12/29/17 218 lb (98.9 kg)   BP Readings from Last 3 Encounters:  04/04/18 140/80  01/02/18 122/84  11/06/17 120/70     General appearence: alert, no distress, WD/WN,  HEENT: normocephalic, sclerae anicteric, PERRLA, EOMi, nares patent, no discharge or erythema, pharynx normal Oral cavity: MMM, no lesions Neck: supple, no lymphadenopathy, no thyromegaly, no masses, no JVD Heart: RRR, normal S1, S2, no murmurs Lungs: CTA bilaterally, no wheezes, rhonchi, or rales Abdomen: +bs, soft, non tender, non distended, no masses, no hepatomegaly, no splenomegaly Extremities: no edema, no cyanosis, no clubbing Pulses: 2+ symmetric, upper and lower extremities, normal cap refill Neurological: alert, oriented x 3, CN2-12 intact, strength normal upper extremities and lower extremities, sensation normal  throughout, DTRs 2+ throughout, no cerebellar signs, gait normal Psychiatric: normal affect, behavior normal, pleasant   EKG done for chest pain Rate 64 bpm, PR interval 186 ms, QRS 86 ms, QTC 474 ms, axis 31 degrees, normal sinus rhythm     Assessment:     Encounter Diagnoses  Name Primary?  . Chest pain, unspecified type Yes  . Dizziness   . Lightheaded   . OSA on CPAP   . Other fatigue        Plan:      He certainly has prior known heart disease, sleep apnea, but there are many potential causes of his symptoms.  I reviewed his 12/2017 visit with cardiology.  We will request a CPAP compliance report.  Labs as below.  If worse symptoms in the meantime go to the emergency  department or call 911.  Based on his sleep hygiene he is not getting better on average 5 to 6 hours of nightly sleep.  We discussed making 7 to 8 hours per night of sleep a priority.  Addendum: CPAP compliance report reviewed.  Usage 87%, >4 hours 75%, <4 hours 13%  Bobby Kelly was seen today for lite headed.  Diagnoses and all orders for this visit:  Chest pain, unspecified type -     Basic metabolic panel -     CBC -     TSH -     T4, free -     Testosterone -     Orthostatic vital signs -     EKG 12-Lead  Dizziness -     Basic metabolic panel -     CBC -     TSH -     T4, free -     Testosterone -     Orthostatic vital signs -     EKG 12-Lead  Lightheaded -     Basic metabolic panel -     CBC -     TSH -     T4, free -     Testosterone -     Orthostatic vital signs -     EKG 12-Lead  OSA on CPAP -     Basic metabolic panel -     CBC -     TSH -     T4, free -     Testosterone -     Orthostatic vital signs -     EKG 12-Lead  Other fatigue -     Basic metabolic panel -     CBC -     TSH -     T4, free -     Testosterone -     Orthostatic vital signs -     EKG 12-Lead

## 2018-04-05 LAB — BASIC METABOLIC PANEL
BUN/Creatinine Ratio: 19 (ref 9–20)
BUN: 16 mg/dL (ref 6–20)
CALCIUM: 9.2 mg/dL (ref 8.7–10.2)
CHLORIDE: 106 mmol/L (ref 96–106)
CO2: 21 mmol/L (ref 20–29)
Creatinine, Ser: 0.83 mg/dL (ref 0.76–1.27)
GFR calc non Af Amer: 113 mL/min/{1.73_m2} (ref >59)
GFR, EST AFRICAN AMERICAN: 131 mL/min/{1.73_m2} (ref >59)
Glucose: 74 mg/dL (ref 65–99)
POTASSIUM: 4.2 mmol/L (ref 3.5–5.2)
Sodium: 143 mmol/L (ref 134–144)

## 2018-04-05 LAB — TESTOSTERONE: Testosterone: 256 ng/dL — ABNORMAL LOW (ref 264–916)

## 2018-04-05 LAB — CBC
HEMOGLOBIN: 16.5 g/dL (ref 13.0–17.7)
Hematocrit: 48.2 % (ref 37.5–51.0)
MCH: 30.8 pg (ref 26.6–33.0)
MCHC: 34.2 g/dL (ref 31.5–35.7)
MCV: 90 fL (ref 79–97)
Platelets: 277 10*3/uL (ref 150–450)
RBC: 5.35 x10E6/uL (ref 4.14–5.80)
RDW: 13 % (ref 11.6–15.4)
WBC: 8.9 10*3/uL (ref 3.4–10.8)

## 2018-04-05 LAB — TSH: TSH: 1.46 u[IU]/mL (ref 0.450–4.500)

## 2018-04-05 LAB — T4, FREE: FREE T4: 1.31 ng/dL (ref 0.82–1.77)

## 2018-04-18 ENCOUNTER — Telehealth: Payer: Self-pay | Admitting: Cardiovascular Disease

## 2018-04-18 NOTE — Telephone Encounter (Signed)
Patient returned call.  Patient states that for the past few days he has not been feeling well. He states he has been lightheaded, and dizzy and at times nauseated. He wears a fitbit and it warns him that his heart rate is in the 50's. He also states he has numbness in his fingers.   The only BP he had was on Sunday it was 138/88, and HR was 67. This morning unable to check BP at this time, but does state his HR is 70. He is feeling okay right now denies SOB, Chest pains, and swelling.   He took his metoprolol and losartan yesterday and is wondering if he should take it today, since his HR is dropping. Can you please advise?  Thanks!

## 2018-04-18 NOTE — Telephone Encounter (Signed)
Called, advised with patient message from PharmD.  Patient verbalized understanding.

## 2018-04-18 NOTE — Telephone Encounter (Signed)
STAT if HR is under 50 or over 120 (normal HR is 60-100 beats per minute)  1) What is your heart rate? 50's  2) Do you have a log of your heart rate readings (document readings)? No, he just know it's in the 50's.   3) Do you have any other symptoms? Dizzy, and feeling nauseous. Patient is wondering if HR continues low if he should still take losartan (COZAAR) 25 MG tablet and metoprolol succinate (TOPROL-XL) 50 MG 24 hr tablet.  He is scheduled to see Corine Shelter on 3/18.

## 2018-04-18 NOTE — Telephone Encounter (Signed)
Attempted to contact patient, did no answer, LVM to return call.

## 2018-04-18 NOTE — Telephone Encounter (Signed)
Patient should continue losartan as prescribed.   Recommend to decrease metoprolol to 1/2 tablet daily until f/u with in 1 week.

## 2018-04-24 NOTE — Progress Notes (Signed)
Cardiology Office Note   Date:  04/25/2018   ID:  Bobby Kelly, DOB 1980-03-20, MRN [id number]  PCP:  Jac Canavan, PA-C  Cardiologist: Dr. Chilton Si, MD   Chief Complaint  Patient presents with  . Chest Pain  . Dizziness    History of Present Illness: Bobby Kelly is a 38 y.o. male who presents for the evaluation of dizziness, seen for Dr. Duke Salvia.   Bobby Kelly has a prior hx of chronic systolic and diastolic heart failure, OSA and HTN. He was initially referred from his PCP for the evaluation of heart failure and chest pain. He saw Dr. Estill Dooms 08/2016 for tachycardia, chest pain and SOB. His HR as sustaining in the 100-130's per his FitBit. He wore a 48-H Holter monitor that did not reveal any other arrhthymias. He underwent a Myoview stress test that showed an LVEF of 44% with concern for inferior infarct.   He was then seen in the ED 09/2017 with dyspnea. CE's were negative and EKG was without ischemic changes. Prior to that he saw Dr. [name] 09/25/2017 for chest pain and underwent an echocardiogram that showed an LVEF of 35-40% with G2DD. HCTZ and amlodipine were switched to losartan and metoprolol. His repeat echo showed LV improvement to 50-55% in 04/2017. [name] reduced his dose of losartan due to low BP.   He was last seen by his primary cardiologist, Dr. Duke Salvia on 12/2017. He had been referred for sleep evaluation and was found to have OSA. He was being worked up for CPAP initiation. He reports compliance with CPAP.   Today he is here with c/o dizzness. He states that several weeks ago he began having brief, infrequent episodes of dizziness in which he reports feeling like he was going to pass out, however never did. He states that they would happen in various contexts including after eating, while driving and while at work. He denies chest pain, LE swelling, palpitations, syncope, SOB or recent illness. He states that the episodes would last only a minute or so  then would dissipate on there own. He has been recording his HR with his Fitbit and states that it was in the 50's which is low for him. He shows it to me today and there are spikes to the 150-190 range however this could be artifact as the patient states he was asymptomatic. He describes that he has been having mild abdominal discomfort as well including several episodes of diarrhea. He called to the office in which he was instructed to decrease his Toprol to 25mg  daily. Since that time, he has had HR no lower than 60bpm. He states that he was seen by his PCP 4 weeks ago for ongoing fatigue and his testosterone ws found to be low. His TSH was WNL.    Past Medical History:  Diagnosis Date  . Allergy   . CHF (congestive heart failure) (HCC) 2018  . Cholelithiasis 2018   cholecystectomy with complication, drain   . Dry skin    nasal folds  . Hypertension   . OSA (obstructive sleep apnea)   . Thyroid disease    hyperactive in high school, had oral therapy.    . Wears contact lenses     Past Surgical History:  Procedure Laterality Date  . APPENDECTOMY    . CHOLECYSTECTOMY N/A 12/03/2016   Procedure: LAPAROSCOPIC CHOLECYSTECTOMY;  Surgeon: Violeta Gelinas, MD;  Location: [facility] OR;  Service: General;  Laterality: N/A;  . ERCP N/A 12/19/2016  Procedure: ENDOSCOPIC RETROGRADE CHOLANGIOPANCREATOGRAPHY (ERCP);  Surgeon: Vida Rigger, MD;  Location: [facility] ENDOSCOPY;  Service: Endoscopy;  Laterality: N/A;  . ESOPHAGOGASTRODUODENOSCOPY (EGD) WITH PROPOFOL N/A 12/13/2016   Procedure: ESOPHAGOGASTRODUODENOSCOPY (EGD) WITH PROPOFOL;  Surgeon: Willis Modena, MD;  Location: [facility] ENDOSCOPY;  Service: Endoscopy;  Laterality: N/A;  . ESOPHAGOGASTRODUODENOSCOPY (EGD) WITH PROPOFOL N/A 12/15/2016   Procedure: ESOPHAGOGASTRODUODENOSCOPY (EGD) WITH PROPOFOL;  Surgeon: Willis Modena, MD;  Location: [facility] ENDOSCOPY;  Service: Endoscopy;  Laterality: N/A;  . ESOPHAGOGASTRODUODENOSCOPY (EGD) WITH PROPOFOL N/A 04/11/2017    Procedure: ESOPHAGOGASTRODUODENOSCOPY (EGD) WITH PROPOFOL;  Surgeon: Vida Rigger, MD;  Location: [facility] ENDOSCOPY;  Service: Endoscopy;  Laterality: N/A;  side viewing scope- biliary stent removal  . IR CATHETER TUBE CHANGE  12/23/2016  . IR RADIOLOGIST EVAL & MGMT  01/03/2017  . IR RADIOLOGIST EVAL & MGMT  01/17/2017  . IR RADIOLOGIST EVAL & MGMT  01/25/2017  . IR RADIOLOGIST EVAL & MGMT  02/09/2017  . IR RADIOLOGIST EVAL & MGMT  02/21/2017  . IR THORACENTESIS ASP PLEURAL SPACE W/IMG GUIDE  12/23/2016    Current Outpatient Medications  Medication Sig Dispense Refill  . acetaminophen (TYLENOL) 325 MG tablet Take 325-650 mg by mouth every 6 (six) hours as needed for mild pain, moderate pain or headache.     . losartan (COZAAR) 25 MG tablet Take 1 tablet (25 mg total) by mouth daily. 90 tablet 3  . metoprolol succinate (TOPROL-XL) 25 MG 24 hr tablet Take 1 tablet (25 mg total) by mouth daily. Take with or immediately following a meal. 30 tablet 1  . nitroGLYCERIN (NITROSTAT) 0.4 MG SL tablet Place 1 tablet under tongue every 5 minutes prn for chest pain up to 2 times and if no improvement, go to the emergency room 50 tablet 3  . Propylene Glycol (SYSTANE COMPLETE OP) Place 1-2 drops into both eyes daily.     No current facility-administered medications for this visit.     Allergies:   Patient has no known allergies.    Social History:  The patient  reports that he has never smoked. He has never used smokeless tobacco. He reports current alcohol use of about 4.0 standard drinks of alcohol per week. He reports that he does not use drugs.   Family History:  The patient's family history includes Cancer in his maternal grandmother; Cataracts in his mother; Diabetes in an other family member; Hyperlipidemia in his father; Hypertension in his mother; Pancreatic cancer in his maternal grandmother.    ROS:  Please see the history of present illness.   Otherwise, review of systems are positive for none.  All other systems are reviewed and negative.    PHYSICAL EXAM: VS:  BP (!) 132/92   Pulse 61   Ht 5\' 5"  (1.651 m)   Wt 219 lb (99.3 kg)   BMI 36.44 kg/m  , BMI Body mass index is 36.44 kg/m.   General: Well developed, well nourished, NAD Head: Normocephalic, atraumatic, no xanthomas, clear, moist mucus membranes. Neck: Negative for carotid bruits. No JVD Lungs:Clear to ausculation bilaterally. No wheezes, rales, or rhonchi. Breathing is unlabored. Cardiovascular: RRR with S1 S2. No murmurs, rubs, gallops, or LV heave appreciated. Abdomen: Soft, non-tender, non-distended. No obvious abdominal masses. MSK: Strength and tone appear normal for age. 5/5 in all extremities Extremities: No edema. No clubbing or cyanosis. DP/PT pulses 2+ bilaterally Neuro: Alert and oriented. No focal deficits. No facial asymmetry. MAE spontaneously. Psych: Responds to questions appropriately with normal affect.  EKG:  EKG is ordered today. The ekg ordered today demonstrates NSR with no acute changes    Recent Labs: 09/26/2017: B Natriuretic Peptide 9.6 04/04/2018: BUN 16; Creatinine, Ser 0.83; Hemoglobin 16.5; Platelets 277; Potassium 4.2; Sodium 143; TSH 1.460    Lipid Panel    Component Value Date/Time   CHOL 175 11/06/2017 0908   TRIG 104 11/06/2017 0908   HDL 59 11/06/2017 0908   CHOLHDL 3.0 11/06/2017 0908   CHOLHDL 3.3 11/03/2016 0811   LDLCALC 95 11/06/2017 0908   LDLCALC 94 11/03/2016 0811    Wt Readings from Last 3 Encounters:  04/25/18 219 lb (99.3 kg)  04/04/18 221 lb 6.4 oz (100.4 kg)  01/02/18 226 lb 6.4 oz (102.7 kg)    Other studies Reviewed: Additional studies/ records that were reviewed today include:   Lexiscan Myoview 09/2016:  Nuclear stress EF: 44%.  No significant change in nonspecific ST abnormality during infusion.  There is a small defect of mild severity present in the basal inferior and mid inferior location. The defect is partially reversible and  consistent with infarct with peri infarct ischemia vs. Variations in diaphragmatic attenuation artifact.  The left ventricular ejection fraction is moderately decreased (30-44%).  This is an intermediate risk study.  Recommend 2D echo for further assessment of LVF and inferior wall motion.  Echo 09/19/16: Study Conclusions  - Left ventricle: The cavity size was normal. Wall thickness was normal. Systolic function was moderately reduced. The estimated ejection fraction was in the range of 35% to 40%. Features are consistent with a pseudonormal left ventricular filling pattern, with concomitant abnormal relaxation and increased filling pressure (grade 2 diastolic dysfunction). - Right atrium: The atrium was mildly dilated.  Echo 04/10/17: Study Conclusions  - Left ventricle: The cavity size was normal. Wall thickness was normal. Systolic function was normal. The estimated ejection fraction was in the range of 50% to 55%. Wall motion was normal; there were no regional wall motion abnormalities. The study is not technically sufficient to allow evaluation of LV diastolic function. - Mitral valve: Calcified annulus. Mildly thickened leaflets .  ASSESSMENT AND PLAN:  1. Dizziness: -Pt reports several weeks ago he began having brief, infrequent episodes of dizziness in which he reports feeling like he was going to pass out, however never did. He states that they would happen in various contexts including after eating, while driving and while at work. -Wore a 48H Holter on two separate occasions which showed no arrhthymias, only ST -Negative stress test 09/2016 -Last echo with normalizing LV function at 50-55% -Continue decreased dose of Toprol 25mg  daily -Will place 2 week event monitor then will need follow up after monitor reading for results -Denies chest pain -EKG with HR 61 and no acute changes -BP stable>>continue to home monitor   2.  Hypertension: -Stable, continue losartan and Toprol   3. OSA: -Reports daily compliance with CPAP machine   4. Chronic systolic and diastolic heart failure:  -LVEF improved to 50-55% on most recent echocardiogram  -Continue Toprol losartan -Checking CMET  5. Abdominal pain: -Reports a 2-3 week hx of abdominal discomfort  -Labs completed by PCP however pt is wanting to check LFTs today -Encouraged that will will inform him of results and if abnormal, would follow up with PCP   Current medicines are reviewed at length with the patient today.  The patient does not have concerns regarding medicines.  The following changes have been made: Toprol 25mg    Labs/ tests ordered today include:  CMET  Orders Placed This Encounter  Procedures  . Comprehensive Metabolic Panel (CMET)  . Cardiac event monitor    Disposition:   FU with Dr. Duke Salvia or APP in 4 weeks    Signed, [name], NP  04/25/2018 12:21 PM    [facility] [address] [place], [place], [place]  [address] Phone: [phone]; Fax: [phone]

## 2018-04-25 ENCOUNTER — Ambulatory Visit (INDEPENDENT_AMBULATORY_CARE_PROVIDER_SITE_OTHER): Payer: PRIVATE HEALTH INSURANCE | Admitting: Cardiology

## 2018-04-25 ENCOUNTER — Encounter: Payer: Self-pay | Admitting: Cardiology

## 2018-04-25 ENCOUNTER — Other Ambulatory Visit: Payer: Self-pay | Admitting: Cardiology

## 2018-04-25 ENCOUNTER — Other Ambulatory Visit: Payer: Self-pay

## 2018-04-25 VITALS — BP 132/92 | HR 61 | Ht 65.0 in | Wt 219.0 lb

## 2018-04-25 DIAGNOSIS — Z79899 Other long term (current) drug therapy: Secondary | ICD-10-CM

## 2018-04-25 DIAGNOSIS — I5020 Unspecified systolic (congestive) heart failure: Secondary | ICD-10-CM

## 2018-04-25 DIAGNOSIS — R42 Dizziness and giddiness: Secondary | ICD-10-CM

## 2018-04-25 DIAGNOSIS — G4733 Obstructive sleep apnea (adult) (pediatric): Secondary | ICD-10-CM | POA: Diagnosis not present

## 2018-04-25 MED ORDER — METOPROLOL SUCCINATE ER 25 MG PO TB24: 25.0000 mg | ORAL_TABLET | Freq: Every day | ORAL | 1 refills | Status: DC

## 2018-04-25 NOTE — Patient Instructions (Addendum)
Medication Instructions:  Your physician recommends that you continue on your current medications as directed. Please refer to the Current Medication list given to you today. If you need a refill on your cardiac medications before your next appointment, please call your pharmacy.   Lab work: Your physician recommends that you return for lab work in: TODAY-CMET If you have labs (blood work) drawn today and your tests are completely normal, you will receive your results only by: Marland Kitchen MyChart Message (if you have MyChart) OR . A paper copy in the mail If you have any lab test that is abnormal or we need to change your treatment, we will call you to review the results.  Testing/Procedures: Your physician has recommended that you wear an 14 DAY event monitor. Event monitors are medical devices that record the heart's electrical activity. Doctors most often [place] these monitors to diagnose arrhythmias. Arrhythmias are problems with the speed or rhythm of the heartbeat. The monitor is a small, portable device. You can wear one while you do your normal daily activities. This is usually used to diagnose what is causing palpitations/syncope (passing out). [address]  Follow-Up: At [facility], you and your health needs are our priority.  As part of our continuing mission to provide you with exceptional heart care, we have created designated Provider Care Teams.  These Care Teams include your primary Cardiologist (physician) and Advanced Practice Providers (APPs -  Physician Assistants and Nurse Practitioners) who all work together to provide you with the care you need, when you need it.  . Your physician recommends that you schedule a follow-up appointment in: AFTER MONITOR NEEDS APPT WITH APP  Any Other Special Instructions Will Be Listed Below (If Applicable).

## 2018-04-26 LAB — COMPREHENSIVE METABOLIC PANEL
A/G RATIO: 1.6 (ref 1.2–2.2)
ALBUMIN: 4.5 g/dL (ref 4.0–5.0)
ALT: 56 IU/L — ABNORMAL HIGH (ref 0–44)
AST: 34 IU/L (ref 0–40)
Alkaline Phosphatase: 99 IU/L (ref 39–117)
BILIRUBIN TOTAL: 0.4 mg/dL (ref 0.0–1.2)
BUN / CREAT RATIO: 19 (ref 9–20)
BUN: 17 mg/dL (ref 6–20)
CALCIUM: 9.2 mg/dL (ref 8.7–10.2)
CO2: 22 mmol/L (ref 20–29)
Chloride: 103 mmol/L (ref 96–106)
Creatinine, Ser: 0.88 mg/dL (ref 0.76–1.27)
GFR calc non Af Amer: 111 mL/min/{1.73_m2} (ref >59)
GFR, EST AFRICAN AMERICAN: 128 mL/min/{1.73_m2} (ref >59)
GLOBULIN, TOTAL: 2.9 g/dL (ref 1.5–4.5)
GLUCOSE: 70 mg/dL (ref 65–99)
POTASSIUM: 4.2 mmol/L (ref 3.5–5.2)
SODIUM: 140 mmol/L (ref 134–144)
Total Protein: 7.4 g/dL (ref 6.0–8.5)

## 2018-04-30 ENCOUNTER — Telehealth: Payer: Self-pay | Admitting: Cardiovascular Disease

## 2018-04-30 NOTE — Telephone Encounter (Signed)
Spoke with the pt. Pt given pre-lim lab results.  Pt was seen by [name], PA on 04/25/18. Per Jill's instruction regarding lab results.  Reports a 2-3 week hx of abdominal discomfort  -Labs completed by PCP however pt is wanting to check LFTs today -Encouraged that will will inform him of results and if abnormal, would follow up with PCP  Results fwd to Crosby Oyster, PA-C pt pcp.via Epic.  Pt sts that he will f/u with his pcp.

## 2018-04-30 NOTE — Telephone Encounter (Signed)
New Message ° ° ° ° °Patient calling for lab results. °

## 2018-04-30 NOTE — Addendum Note (Signed)
Addended by: Chana Bode on: 04/30/2018 11:57 AM   Modules accepted: Orders

## 2018-05-08 ENCOUNTER — Telehealth: Payer: Self-pay | Admitting: *Deleted

## 2018-05-08 NOTE — Telephone Encounter (Signed)
Cancelled 4/1 Cardiac event monitor appointment due to COVID-19 precautions.  Patient agreed to have 14 day Cardiac event monitor shipped directly to his home by [employer].  Patient enrolled with [employer] to ship a 14 day cardiac event monitor.  Patient instructed, [employer] will contact to verify shipping address.  [employer] will then ship monitor via 2nd day air.  Once the patient receives monitor he is to review instructions, apply, and call [employer] at [phone] to activate the monitor.

## 2018-05-10 ENCOUNTER — Other Ambulatory Visit: Payer: Self-pay

## 2018-05-10 ENCOUNTER — Encounter: Payer: Self-pay | Admitting: Family Medicine

## 2018-05-10 ENCOUNTER — Ambulatory Visit (INDEPENDENT_AMBULATORY_CARE_PROVIDER_SITE_OTHER): Payer: Commercial Managed Care - PPO | Admitting: Family Medicine

## 2018-05-10 VITALS — BP 144/92 | HR 80 | Temp 97.4°F | Ht 65.0 in | Wt 212.0 lb

## 2018-05-10 DIAGNOSIS — R1010 Upper abdominal pain, unspecified: Secondary | ICD-10-CM | POA: Diagnosis not present

## 2018-05-10 DIAGNOSIS — K219 Gastro-esophageal reflux disease without esophagitis: Secondary | ICD-10-CM | POA: Diagnosis not present

## 2018-05-10 DIAGNOSIS — R197 Diarrhea, unspecified: Secondary | ICD-10-CM

## 2018-05-10 NOTE — Patient Instructions (Addendum)
Today we discussed your abdominal pain and stool changes over the last several weeks (since prior to your last visit with Vincenza Hews in February). Since your bowels remain loose, never got completely better, I recommend that you start taking a probiotic to help get the gut flora back to normal (ie Align, or other probiotic). Be sure to avoid fatty and fried foods, as this can worsen diarrhea related to no longer having your gall bladder.  Given your heartburn symptoms today, as well as that they saw gastritis (inflammation of the stomach) on your last endoscopy last year, I recommend avoiding all anti-inflammatories (advil,  Motrin, ibuprofen, aleve, etc), and use Tylenol only, if any medication is needed for pain. I also recommend avoiding spicy foods, ctirus/acidic foods, caffeine and alcohol (especially because your one liver test was mildly elevated a couple of weeks ago).  I recommend that you start taking a PPI once daily (proton pump inhibitor--this includes omeprazole (priloec OTC, 20mg ), or Nexium 24 hrs or its generic).  Take one of these once daily for at least 2 weeks.  You may need to take it for longer. If you have only minimal improvement (but some), we may need to increase the dose. Please contact us next week and let us know how you are doing.  We discussed the need for further evaluation if you develop worsening pain, fever, chills, black or bloody stools, blood in the urine, other urinary complaints, dizziness or dehydration.  Try and drink plenty of water, and keep the urine a light yellow color. If pain recurs in the back and intensifies (gets much worse), that is also something we need to further investigate.  Here is some additional information, some of which we discussed (avoiding dairy, BRAT diet, etc)   Food Choices for Gastroesophageal Reflux Disease, Adult When you have gastroesophageal reflux disease (GERD), the foods you eat and your eating habits are very important.  Choosing the right foods can help ease the discomfort of GERD. Consider working with a diet and nutrition specialist (dietitian) to help you make healthy food choices. What general guidelines should I follow?  Eating plan  Choose healthy foods low in fat, such as fruits, vegetables, whole grains, low-fat dairy products, and lean meat, fish, and poultry.  Eat frequent, small meals instead of three large meals each day. Eat your meals slowly, in a relaxed setting. Avoid bending over or lying down until 2-3 hours after eating.  Limit high-fat foods such as fatty meats or fried foods.  Limit your intake of oils, butter, and shortening to less than 8 teaspoons each day.  Avoid the following: ? Foods that cause symptoms. These may be different for different people. Keep a food diary to keep track of foods that cause symptoms. ? Alcohol. ? Drinking large amounts of liquid with meals. ? Eating meals during the 2-3 hours before bed.  Cook foods using methods other than frying. This may include baking, grilling, or broiling. Lifestyle  Maintain a healthy weight. Ask your health care provider what weight is healthy for you. If you need to lose weight, work with your health care provider to do so safely.  Exercise for at least 30 minutes on 5 or more days each week, or as told by your health care provider.  Avoid wearing clothes that fit tightly around your waist and chest.  Do not use any products that contain nicotine or tobacco, such as cigarettes and e-cigarettes. If you need help quitting, ask your health care provider.  Sleep with the head of your bed raised. Use a wedge under the mattress or blocks under the bed frame to raise the head of the bed. What foods are not recommended? The items listed may not be a complete list. Talk with your dietitian about what dietary choices are best for you. Grains Pastries or quick breads with added fat. [place] toast. Vegetables Deep fried vegetables.  [place] fries. Any vegetables prepared with added fat. Any vegetables that cause symptoms. For some people this may include tomatoes and tomato products, chili peppers, onions and garlic, and horseradish. Fruits Any fruits prepared with added fat. Any fruits that cause symptoms. For some people this may include citrus fruits, such as oranges, grapefruit, pineapple, and lemons. Meats and other protein foods High-fat meats, such as fatty beef or pork, hot dogs, ribs, ham, sausage, salami and bacon. Fried meat or protein, including fried fish and fried chicken. Nuts and nut butters. Dairy Whole milk and chocolate milk. Sour cream. Cream. Ice cream. Cream cheese. Milk shakes. Beverages Coffee and tea, with or without caffeine. Carbonated beverages. Sodas. Energy drinks. Fruit juice made with acidic fruits (such as orange or grapefruit). Tomato juice. Alcoholic drinks. Fats and oils Butter. Margarine. Shortening. Ghee. Sweets and desserts Chocolate and cocoa. Donuts. Seasoning and other foods Pepper. Peppermint and spearmint. Any condiments, herbs, or seasonings that cause symptoms. For some people, this may include curry, hot sauce, or vinegar-based salad dressings. Summary  When you have gastroesophageal reflux disease (GERD), food and lifestyle choices are very important to help ease the discomfort of GERD.  Eat frequent, small meals instead of three large meals each day. Eat your meals slowly, in a relaxed setting. Avoid bending over or lying down until 2-3 hours after eating.  Limit high-fat foods such as fatty meat or fried foods. This information is not intended to replace advice given to you by your health care provider. Make sure you discuss any questions you have with your health care provider. Document Released: 01/24/2005 Document Revised: 01/26/2016 Document Reviewed: 01/26/2016 Elsevier Interactive Patient Education  2019 Else[employer] Inc.   Diarrhea, Adult Diarrhea is when you pass  loose and watery poop (stool) often. Diarrhea can make you feel weak and cause you to lose water in your body (get dehydrated). Losing water in your body can cause you to:  Feel tired and thirsty.  Have a dry mouth.  Go pee (urinate) less often. Diarrhea often lasts 2-3 days. However, it can last longer if it is a sign of something more serious. It is important to treat your diarrhea as told by your doctor. Follow these instructions at home: Eating and drinking     Follow these instructions as told by your doctor:  Take an ORS (oral rehydration solution). This is a drink that helps you replace fluids and minerals your body lost. It is sold at pharmacies and stores.  Drink plenty of fluids, such as: ? Water. ? Ice chips. ? Diluted fruit juice. ? Low-calorie sports drinks. ? Milk, if you want.  Avoid drinking fluids that have a lot of sugar or caffeine in them.  Eat bland, easy-to-digest foods in small amounts as you are able. These foods include: ? Bananas. ? Applesauce. ? Rice. ? Low-fat (lean) meats. ? Toast. ? Crackers.  Avoid alcohol.  Avoid spicy or fatty foods.  Medicines  Take over-the-counter and prescription medicines only as told by your doctor.  If you were prescribed an antibiotic medicine, take it as told by your doctor.  Do not stop using the antibiotic even if you start to feel better. General instructions   Wash your hands often using soap and water. If soap and water are not available, use a hand sanitizer. Others in your home should wash their hands as well. Hands should be washed: ? After using the toilet or changing a diaper. ? Before preparing, cooking, or serving food. ? While caring for a sick person. ? While visiting someone in a hospital.  Drink enough fluid to keep your pee (urine) pale yellow.  Rest at home while you get better.  Watch your condition for any changes.  Take a warm bath to help with any burning or pain from having  diarrhea.  Keep all follow-up visits as told by your doctor. This is important. Contact a doctor if:  You have a fever.  Your diarrhea gets worse.  You have new symptoms.  You cannot keep fluids down.  You feel light-headed or dizzy.  You have a headache.  You have muscle cramps. Get help right away if:  You have chest pain.  You feel very weak or you pass out (faint).  You have bloody or black poop or poop that looks like tar.  You have very bad pain, cramping, or bloating in your belly (abdomen).  You have trouble breathing or you are breathing very quickly.  Your heart is beating very quickly.  Your skin feels cold and clammy.  You feel confused.  You have signs of losing too much water in your body, such as: ? Dark pee, very little pee, or no pee. ? Cracked lips. ? Dry mouth. ? Sunken eyes. ? Sleepiness. ? Weakness. Summary  Diarrhea is when you pass loose and watery poop (stool) often.  Diarrhea can make you feel weak and cause you to lose water in your body (get dehydrated).  Take an ORS (oral rehydration solution). This is a drink that is sold at pharmacies and stores.  Eat bland, easy-to-digest foods in small amounts as you are able.  Contact a doctor if your condition gets worse. Get help right away if you have signs that you have lost too much water in your body. This information is not intended to replace advice given to you by your health care provider. Make sure you discuss any questions you have with your health care provider. Document Released: 07/13/2007 Document Revised: 06/30/2017 Document Reviewed: 06/30/2017 Elsevier Interactive Patient Education  2019 [employer].

## 2018-05-10 NOTE — Progress Notes (Signed)
Start time: 3:17 End time: 3:52  Virtual Visit via Video Note  I connected with Bobby Kelly on 05/10/18  by a video enabled telemedicine application (Zoom) and verified that I am speaking with the correct person using two identifiers.   Patient is at home, nobody else in the bedroom with him. MD is in the office   I discussed the limitations of evaluation and management by telemedicine and the availability of in person appointments. The patient expressed understanding and agreed to proceed.  History of Present Illness:  Chief Complaint  Patient presents with  . Abdominal Pain    for about 2 weeks, worsened this morning. Diarrhea this morning. Also heartburn/acid reflux currently, slightly nauseous. Also has uppper back pain.   He reports he has been feeling bad for a while.  He saw Audelia Acton 2/26, felt weak, tired, some dizziness, some chest pains. Labs were checked--testosterone was low at 256, CBC, thyroid and b-met were fine.  He reports the weekend after after his visit here, he had diarrhea that lasted for 4 days. He has had constipation or diarrhea since then, and some stomach "churning".   He saw his cardiologist on 3/18, where c-met was checked which showed an ALT of 56, otherwise normal. They will be doing a 2 week event monitor. He does drink some alcohol, reports the last alcohol was 2 weeks ago, when he had 2 beers.  This morning when he woke up, pain seemed stronger.  Pain is dull, but with cramps as well throughout the day.  Cramps just started today. Pain was in the RUQ, but spread across the upper abdomen. He reported having some upper back pain yesterday, but none today. Today he notices heartburn, up chest up into his throat, has acid taste. No vomiting.  Yesterday his bowels were loose, somewhat thin (not watery), mucousy.  No black or bloody stools.  His last BM was this morning, and was mostly watery, yellowish. Ongoing GI problems for at least 2-3 weeks.  When he was  still working, he had noticed increased gassiness, which has improved  Stomach rumbles a lot, especially at night.  Denies changes to his diet in the past few weeks. Eating more at home. Rice, beans, meat, occasional cheese, fish, shrimp.    PMH reviewed--HTN, OSA, CHF, h/o hyperthyroidism PSH--s/p cholecystectomy and appendectomy EGD last 04/11/17--plastic stent removed from duodenum (Dr. Watt Climes) Prior EGD done 12/2016 (done shortly after his chole): - Congested mucosa in the esophagus. - Gastritis. - Normal duodenal bulb, first portion of the duodenum and second portion of the duodenum.  Outpatient Encounter Medications as of 05/10/2018  Medication Sig  . losartan (COZAAR) 25 MG tablet Take 1 tablet (25 mg total) by mouth daily.  . metoprolol succinate (TOPROL-XL) 25 MG 24 hr tablet Take 1 tablet (25 mg total) by mouth daily. Take with or immediately following a meal.  . acetaminophen (TYLENOL) 325 MG tablet Take 325-650 mg by mouth every 6 (six) hours as needed for mild pain, moderate pain or headache.   . nitroGLYCERIN (NITROSTAT) 0.4 MG SL tablet Place 1 tablet under tongue every 5 minutes prn for chest pain up to 2 times and if no improvement, go to the emergency room (Patient not taking: Reported on 05/10/2018)  . Propylene Glycol (SYSTANE COMPLETE OP) Place 1-2 drops into both eyes daily.   No facility-administered encounter medications on file as of 05/10/2018.    Denies taking any NSAIDs He took some pepto bismol back in February, which helped.  None recently.  No Known Allergies  ROS: No fever, chills, URI symptoms, allergies, PND. GI symptoms per HPI.  Denies any urinary complaints.  No chest pain, shortness of breath, bleeding, bruising. See HPI.   Observations/Objective:  BP (!) 144/92   Pulse 80   Temp (!) 97.4 F (36.3 C) (Oral)   Ht _0  (1.651 m)   Wt 212 lb (96.2 kg)   BMI 35.28 kg/m   Exam is limited due to the virtual video nature of this visit. He appears  comfortable, in no distress There does not appear to be any scleral icterus. He is alert, oriented, speaking easily, in no distress or significant discomfort. He appears obese   Assessment and Plan:  Diarrhea, unspecified type - unclear etiology, DDx including recent infection, lack of gall bladder. BRAT diet, probiotics recommended  Gastroesophageal reflux disease, esophagitis presence not specified - counseled re: OTC PPI, reflux diet/precautions.  Upper abdominal pain   Follow Up Instructions:  Patient advised of how to access the after-visit-summary information with detailed additional information.   All questions were answered. Reviewed what he should seek immediate care for. He should touch base with [place] next week.   I discussed the assessment and treatment plan with the patient. The patient was provided an opportunity to ask questions and all were answered. The patient agreed with the plan and demonstrated an understanding of the instructions.   The patient was advised to call back or seek an in-person evaluation if the symptoms worsen or if the condition fails to improve as anticipated.  I provided 30 minutes of non-face-to-face time during this encounter.   Vikki Ports, MD   Probiotics prilosec OTC or Nexium 24 hrs (over-the-counter forms of proton pump inhibitors, which help with acid in the stomach and reflux).   Fever Dehydration, fainting/weak Black/bloody   Use tylenol, avoid NSAIDs Avoid alcohol

## 2018-05-11 ENCOUNTER — Ambulatory Visit (INDEPENDENT_AMBULATORY_CARE_PROVIDER_SITE_OTHER): Payer: Commercial Managed Care - PPO

## 2018-05-11 DIAGNOSIS — R42 Dizziness and giddiness: Secondary | ICD-10-CM | POA: Diagnosis not present

## 2018-05-11 DIAGNOSIS — Z79899 Other long term (current) drug therapy: Secondary | ICD-10-CM | POA: Diagnosis not present

## 2018-05-28 ENCOUNTER — Other Ambulatory Visit: Payer: Self-pay

## 2018-05-28 ENCOUNTER — Telehealth: Payer: Self-pay

## 2018-05-28 NOTE — Telephone Encounter (Signed)
Virtual Visit Pre-Appointment Phone Call  Steps For Call:  1. Confirm consent - "In the setting of the current Covid19 crisis, you are scheduled for a (phone or video) visit with your provider on (date) at (time).  Just as we do with many in-office visits, in order for you to participate in this visit, we must obtain consent.  If you'd like, I can send this to your mychart (if signed up) or email for you to review.  Otherwise, I can obtain your verbal consent now.  All virtual visits are billed to your insurance company just like a normal visit would be.  By agreeing to a virtual visit, we'd like you to understand that the technology does not allow for your provider to perform an examination, and thus may limit your provider's ability to fully assess your condition. If your provider identifies any concerns that need to be evaluated in person, we will make arrangements to do so.  Finally, though the technology is pretty good, we cannot assure that it will always work on either your or our end, and in the setting of a video visit, we may have to convert it to a phone-only visit.  In either situation, we cannot ensure that we have a secure connection.  Are you willing to proceed?" STAFF: Did the patient verbally acknowledge consent to telehealth visit? Document YES/NO here:   2. Confirm the BEST phone number to call the day of the visit by including in appointment notes  3. Give patient instructions for WebEx/MyChart download to smartphone as below or Doximity/Doxy.me if video visit (depending on what platform provider is using)  4. Advise patient to be prepared with their blood pressure, heart rate, weight, any heart rhythm information, their current medicines, and a piece of paper and pen handy for any instructions they may receive the day of their visit  5. Inform patient they will receive a phone call 15 minutes prior to their appointment time (may be from unknown caller ID) so they should be  prepared to answer  6. Confirm that appointment type is correct in Epic appointment notes (VIDEO vs PHONE)     TELEPHONE CALL NOTE  Ardean Elko has been deemed a candidate for a follow-up tele-health visit to limit community exposure during the Covid-19 pandemic. I spoke with the patient via phone to ensure availability of phone/video source, confirm preferred email & phone number, and discuss instructions and expectations.  I reminded Cecil Arndt to be prepared with any vital sign and/or heart rhythm information that could potentially be obtained via home monitoring, at the time of his visit. I reminded Cephus Bieschke to expect a phone call at the time of his visit if his visit.  Chana Bode, CMA 05/28/2018 3:14 PM   INSTRUCTIONS FOR DOWNLOADING THE WEBEX APP TO SMARTPHONE  - If Apple, ask patient to go to [employer] and type in WebEx in the search bar. Download [employer], the blue/Modell Fendrick circle. If Android, go to [employer] and type in [employer] in the search bar. The app is free but as with any other app downloads, their phone may require them to verify saved payment information or Apple/Android password.  - The patient does NOT have to create an account. - On the day of the visit, the assist will walk the patient through joining the meeting with the meeting number/password.  INSTRUCTIONS FOR DOWNLOADING THE MYCHART APP TO SMARTPHONE  - The patient must first make sure to  have activated MyChart and know their login information - If Apple, go to [employer] and type in MyChart in the search bar and download the app. If Android, ask patient to go to [employer] and type in [place] in the search bar and download the app. The app is free but as with any other app downloads, their phone may require them to verify saved payment information or Apple/Android password.  - The patient will need to then log into the app with their MyChart username and password, and select [facility]  as their healthcare provider to link the account. When it is time for your visit, go to the MyChart app, find appointments, and click Begin Video Visit. Be sure to Select Allow for your device to access the Microphone and Camera for your visit. You will then be connected, and your provider will be with you shortly.  **If they have any issues connecting, or need assistance please contact MyChart service desk [phone])  **If using a computer, in order to ensure the best quality for their visit they will need to use either of the following Internet Browsers: [employer], or Google Chrome  IF USING DOXIMITY or DOXY.ME - The patient will receive a link just prior to their visit, either by text or email (to be determined day of appointment depending on if it's doxy.me or Doximity).     FULL LENGTH CONSENT FOR TELE-HEALTH VISIT   I hereby voluntarily request, consent and authorize [facility] and its employed or contracted physicians, physician assistants, nurse practitioners or other licensed health care professionals (the Practitioner), to provide me with telemedicine health care services (the "Services") as deemed necessary by the treating Practitioner. I acknowledge and consent to receive the Services by the Practitioner via telemedicine. I understand that the telemedicine visit will involve communicating with the Practitioner through live audiovisual communication technology and the disclosure of certain medical information by electronic transmission. I acknowledge that I have been given the opportunity to request an in-person assessment or other available alternative prior to the telemedicine visit and am voluntarily participating in the telemedicine visit.  I understand that I have the right to withhold or withdraw my consent to the use of telemedicine in the course of my care at any time, without affecting my right to future care or treatment, and that the Practitioner or I  may terminate the telemedicine visit at any time. I understand that I have the right to inspect all information obtained and/or recorded in the course of the telemedicine visit and may receive copies of available information for a reasonable fee.  I understand that some of the potential risks of receiving the Services via telemedicine include:  Marland Kitchen Delay or interruption in medical evaluation due to technological equipment failure or disruption; . Information transmitted may not be sufficient (e.g. poor resolution of images) to allow for appropriate medical decision making by the Practitioner; and/or  . In rare instances, security protocols could fail, causing a breach of personal health information.  Furthermore, I acknowledge that it is my responsibility to provide information about my medical history, conditions and care that is complete and accurate to the best of my ability. I acknowledge that Practitioner's advice, recommendations, and/or decision may be based on factors not within their control, such as incomplete or inaccurate data provided by me or distortions of diagnostic images or specimens that may result from electronic transmissions. I understand that the practice of medicine is not an exact science and  that Practitioner makes no warranties or guarantees regarding treatment outcomes. I acknowledge that I will receive a copy of this consent concurrently upon execution via email to the email address I last provided but may also request a printed copy by calling the office of [facility].    I understand that my insurance will be billed for this visit.   I have read or had this consent read to me. . I understand the contents of this consent, which adequately explains the benefits and risks of the Services being provided via telemedicine.  . I have been provided ample opportunity to ask questions regarding this consent and the Services and have had my questions answered to my satisfaction. . I  give my informed consent for the services to be provided through the use of telemedicine in my medical care  By participating in this telemedicine visit I agree to the above.

## 2018-05-30 ENCOUNTER — Ambulatory Visit: Payer: PRIVATE HEALTH INSURANCE | Admitting: Cardiology

## 2018-06-04 ENCOUNTER — Telehealth: Payer: Self-pay | Admitting: Cardiovascular Disease

## 2018-06-05 ENCOUNTER — Encounter: Payer: Self-pay | Admitting: Cardiovascular Disease

## 2018-06-05 ENCOUNTER — Telehealth (INDEPENDENT_AMBULATORY_CARE_PROVIDER_SITE_OTHER): Payer: Commercial Managed Care - PPO | Admitting: Cardiovascular Disease

## 2018-06-05 DIAGNOSIS — I10 Essential (primary) hypertension: Secondary | ICD-10-CM | POA: Diagnosis not present

## 2018-06-05 DIAGNOSIS — Z9989 Dependence on other enabling machines and devices: Secondary | ICD-10-CM

## 2018-06-05 DIAGNOSIS — G4733 Obstructive sleep apnea (adult) (pediatric): Secondary | ICD-10-CM

## 2018-06-05 DIAGNOSIS — I5042 Chronic combined systolic (congestive) and diastolic (congestive) heart failure: Secondary | ICD-10-CM

## 2018-06-05 DIAGNOSIS — Z6835 Body mass index (BMI) 35.0-35.9, adult: Secondary | ICD-10-CM

## 2018-06-05 NOTE — Patient Instructions (Addendum)
Medication Instructions:  Your physician recommends that you continue on your current medications as directed. Please refer to the Current Medication list given to you today.  If you need a refill on your cardiac medications before your next appointment, please call your pharmacy.   Lab work: NONE  Testing/Procedures: NONE  Follow-Up: At [facility], you and your health needs are our priority.  As part of our continuing mission to provide you with exceptional heart care, we have created designated Provider Care Teams.  These Care Teams include your primary Cardiologist (physician) and Advanced Practice Providers (APPs -  Physician Assistants and Nurse Practitioners) who all work together to provide you with the care you need, when you need it. You will need a follow up appointment in 6 months.  Please call our office 2 months in advance to schedule this appointment.  You may see Chilton Si, MD or one of the following Advanced Practice Providers on your designated Care Team:   Corine Shelter, PA-C Judy Pimple, [place] . Marjie Skiff, PA-C  Any Other Special Instructions Will Be Listed Below (If Applicable).  Keep working on diet and exercise.    If your blood pressure starts to drop below 110 systolic, call us.  We may be able to reduce some of your medication.

## 2018-06-05 NOTE — Progress Notes (Signed)
Virtual Visit via Video Note   This visit type was conducted due to national recommendations for restrictions regarding the COVID-19 Pandemic (e.g. social distancing) in an effort to limit this patient's exposure and mitigate transmission in our community.  Due to his co-morbid illnesses, this patient is at least at moderate risk for complications without adequate follow up.  This format is felt to be most appropriate for this patient at this time.  All issues noted in this document were discussed and addressed.  A limited physical exam was performed with this format.  Please refer to the patient's chart for his consent to telehealth for [facility].   Evaluation Performed:  Follow-up visit  Date:  06/05/2018   ID:  Bobby Kelly, DOB Sep 10, 1980, MRN [id number]  Patient Location: Home Provider Location: Office  PCP:  Jac Canavan, PA-C  Cardiologist:  Chilton Si, MD  Electrophysiologist:  Loman Brooklyn, MD  Chief Complaint:  Follow up  History of Present Illness:    Bobby Kelly is a 38 y.o. male with chronic systolic and diastolic heart failure (LVEF improved to 50-55%), OSA on CPAP here for follow up.  He was initially seen 12/2017 for the evaluation of heart failure and chest pain.  [name] first saw [name] 08/2016 for tachycardia, chest pain, and shortness of breath.  He was noted to have episodes of tachycardia to the 100s on his Fitbit.  His heart rate increased to the 130s with minimal exertion.  He had recently been started on antihypertensives by his primary care provider.  [name] noted that he had episodes of sinus tachycardia.  He had him wear a 48-hour Holter that did not reveal any other significant arrhythmias.  He had an exercise Myoview 09/12/2016 that revealed LVEF 44% with a concern for inferior infarct with peri-infarct ischemia versus diaphragmatic attenuation.  Mr. Sabic was seen in the ED 09/26/2017 with dyspnea.  He became short of breath and dizzy  when driving home.  Cardiac enzymes were negative and his EKG was he was then referred without ischemic changes.  Prior to that he saw Dr. [name] on 09/25/17 with chest discomfort and palpitations.  He had an echocardiogram 09/19/2016 that revealed LVEF 35 to 40% with grade 2 diastolic dysfunction.  Hydrochlorothiazide and amlodipine were switched to losartan and metoprolol.  He had a repeat echocardiogram 04/2017 that revealed improvement in his LVEF to 50 to 55%.  He followed up with [name] 09/27/2017 and losartan was reduced due to low blood pressures.    At his last appointment he reported some atypical chest pain that was thought to be not due to ischemia.  He followed up with [name] on 04/2018 and complained of dizziness.  He felt as though he was going to pass out.  Heart rates were in the 50s at the time.  Metoprolol was reduced and he was ordered a 2-week event monitor 05/2018 that showed no arrhythmias.  He reported intermittent chest pain but no arrhythmias were noted.  The episodes occur at rest and are never with exertion.  There is no associated shortness of breath, nausea or diaphoresis.  He hasn't noted any more dizziness since reducing metoprolol.  He also denies lower extremity edema, orthopnea or PND.  He has been trying to walk for exercise 2 miles daily and has no exertional symptoms.  The patient does not have symptoms concerning for COVID-19 infection (fever, chills, cough, or new shortness of breath).    Past Medical History:  Diagnosis Date  . Allergy   . CHF (congestive heart failure) (HCC) 2018  . Cholelithiasis 2018   cholecystectomy with complication, drain   . Dry skin    nasal folds  . Hypertension   . OSA (obstructive sleep apnea)   . Thyroid disease    hyperactive in high school, had oral therapy.    . Wears contact lenses    Past Surgical History:  Procedure Laterality Date  . APPENDECTOMY    . CHOLECYSTECTOMY N/A 12/03/2016   Procedure: LAPAROSCOPIC  CHOLECYSTECTOMY;  Surgeon: Bobby Gelinas, MD;  Location: [facility] OR;  Service: General;  Laterality: N/A;  . ERCP N/A 12/19/2016   Procedure: ENDOSCOPIC RETROGRADE CHOLANGIOPANCREATOGRAPHY (ERCP);  Surgeon: Bobby Rigger, MD;  Location: [facility] ENDOSCOPY;  Service: Endoscopy;  Laterality: N/A;  . ESOPHAGOGASTRODUODENOSCOPY (EGD) WITH PROPOFOL N/A 12/13/2016   Procedure: ESOPHAGOGASTRODUODENOSCOPY (EGD) WITH PROPOFOL;  Surgeon: Bobby Modena, MD;  Location: [facility] ENDOSCOPY;  Service: Endoscopy;  Laterality: N/A;  . ESOPHAGOGASTRODUODENOSCOPY (EGD) WITH PROPOFOL N/A 12/15/2016   Procedure: ESOPHAGOGASTRODUODENOSCOPY (EGD) WITH PROPOFOL;  Surgeon: Bobby Modena, MD;  Location: [facility] ENDOSCOPY;  Service: Endoscopy;  Laterality: N/A;  . ESOPHAGOGASTRODUODENOSCOPY (EGD) WITH PROPOFOL N/A 04/11/2017   Procedure: ESOPHAGOGASTRODUODENOSCOPY (EGD) WITH PROPOFOL;  Surgeon: Bobby Rigger, MD;  Location: [facility] ENDOSCOPY;  Service: Endoscopy;  Laterality: N/A;  side viewing scope- biliary stent removal  . IR CATHETER TUBE CHANGE  12/23/2016  . IR RADIOLOGIST EVAL & MGMT  01/03/2017  . IR RADIOLOGIST EVAL & MGMT  01/17/2017  . IR RADIOLOGIST EVAL & MGMT  01/25/2017  . IR RADIOLOGIST EVAL & MGMT  02/09/2017  . IR RADIOLOGIST EVAL & MGMT  02/21/2017  . IR THORACENTESIS ASP PLEURAL SPACE W/IMG GUIDE  12/23/2016     No outpatient medications have been marked as taking for the 06/05/18 encounter (Appointment) with Chilton Si, MD.     Allergies:   Patient has no known allergies.   Social History   Tobacco Use  . Smoking status: Never Smoker  . Smokeless tobacco: Never Used  Substance Use Topics  . Alcohol use: Yes    Alcohol/week: 4.0 standard drinks    Types: 3 Cans of beer, 1 Shots of liquor per week    Comment: occ, history of 2-3 years of heavier alcohol  . Drug use: No     Family Hx: The patient's family history includes Cancer in his maternal grandmother; Cataracts in his mother; Diabetes in an other family  member; Hyperlipidemia in his father; Hypertension in his mother; Pancreatic cancer in his maternal grandmother. There is no history of Heart disease or Stroke.  ROS:   Please see the history of present illness.    All other systems reviewed and are negative.   Prior CV studies:   The following studies were reviewed today:  Lexiscan Myoview 09/2016:  Nuclear stress EF: 44%.  No significant change in nonspecific ST abnormality during infusion.  There is a small defect of mild severity present in the basal inferior and mid inferior location. The defect is partially reversible and consistent with infarct with peri infarct ischemia vs. Variations in diaphragmatic attenuation artifact.  The left ventricular ejection fraction is moderately decreased (30-44%).  This is an intermediate risk study.  Recommend 2D echo for further assessment of LVF and inferior wall motion.  Echo 09/19/16: Study Conclusions  - Left ventricle: The cavity size was normal. Wall thickness was normal. Systolic function was moderately reduced. The estimated ejection fraction was in the range of 35% to 40%.  Features are consistent with a pseudonormal left ventricular filling pattern, with concomitant abnormal relaxation and increased filling pressure (grade 2 diastolic dysfunction). - Right atrium: The atrium was mildly dilated.  Echo 04/10/17: Study Conclusions  - Left ventricle: The cavity size was normal. Wall thickness was normal. Systolic function was normal. The estimated ejection fraction was in the range of 50% to 55%. Wall motion was normal; there were no regional wall motion abnormalities. The study is not technically sufficient to allow evaluation of LV diastolic function. - Mitral valve: Calcified annulus. Mildly thickened leaflets .  14 Day Event Monitor 05/2018:  Quality: Fair.  Baseline artifact. Predominant rhythm: sinus rhythm Average heart rate: 80 bpm Max heart  rate: 153 bpm Min heart rate: 45 bpm  Patient reported chest pain, at which time sinus rhythm was noted. No arrhythmias noted.   Labs/Other Tests and Data Reviewed:    EKG:  No ECG reviewed.  Recent Labs: 09/26/2017: B Natriuretic Peptide 9.6 04/04/2018: Hemoglobin 16.5; Platelets 277; TSH 1.460 04/25/2018: ALT 56; BUN 17; Creatinine, Ser 0.88; Potassium 4.2; Sodium 140   Recent Lipid Panel Lab Results  Component Value Date/Time   CHOL 175 11/06/2017 09:08 AM   TRIG 104 11/06/2017 09:08 AM   HDL 59 11/06/2017 09:08 AM   CHOLHDL 3.0 11/06/2017 09:08 AM   CHOLHDL 3.3 11/03/2016 08:11 AM   LDLCALC 95 11/06/2017 09:08 AM   LDLCALC 94 11/03/2016 08:11 AM    Wt Readings from Last 3 Encounters:  05/10/18 212 lb (96.2 kg)  04/25/18 219 lb (99.3 kg)  04/04/18 221 lb 6.4 oz (100.4 kg)     Objective:    BP 113/74   Pulse 76   Ht 5\' 5"  (1.651 m)   Wt 212 lb (96.2 kg)   BMI 35.28 kg/m  GENERAL: Well-appearing.  No acute distress. HEENT: Pupils equal round.  Oral mucosa unremarkable NECK:  No jugular venous distention, no visible thyromegaly EXT:  No edema, no cyanosis no clubbing SKIN:  No rashes no nodules NEURO:  Speech fluent.  Cranial nerves grossly intact.  Moves all 4 extremities freely PSYCH:  Cognitively intact, oriented to person place and time   ASSESSMENT & PLAN:    # Atypical chest pain: Symptoms are atypical.  He has no exertional symptoms.  He does have back pain and thinks this may be related.  He had a negative stress test 09/2016 and no coronary calcification was noted on his chest CT 09/2017.  Therefore, it is unlikely that his symptoms are due to ischemia.  No plans for any additional ischemia evaluation at this time.  # Hypertension: BP well-controlled.  He wants to get off medications.  He will keep working on weight oss with this as a goal.  Continue losartan and metoprolol for now.  # Obestiy:  Encouraged to increase exercise to at least 150 minutes  per week.   # OSA: Continue CPAP.  # Chronic systolic and diastolic heart failure:  LVEF improved to 50-55% on most recent assessment.  It is possible it was tachycardia induced.  Continue metoprolol and losartan.   COVID-19 Education: The signs and symptoms of COVID-19 were discussed with the patient and how to seek care for testing (follow up with PCP or arrange E-visit).  The importance of social distancing was discussed today.  Time:   Today, I have spent 22 minutes with the patient with telehealth technology discussing the above problems.     Medication Adjustments/Labs and Tests Ordered: Current medicines are  reviewed at length with the patient today.  Concerns regarding medicines are outlined above.   Tests Ordered: No orders of the defined types were placed in this encounter.   Medication Changes: No orders of the defined types were placed in this encounter.   Disposition:  Follow up in 6 month(s)  Signed, Chilton Si, MD  06/05/2018 1:12 PM    [facility]

## 2018-08-01 ENCOUNTER — Other Ambulatory Visit: Payer: Self-pay | Admitting: Cardiology

## 2018-08-06 NOTE — Telephone Encounter (Signed)
Opened in error

## 2018-09-30 ENCOUNTER — Other Ambulatory Visit: Payer: Self-pay | Admitting: Cardiology

## 2018-12-07 ENCOUNTER — Ambulatory Visit: Payer: Commercial Managed Care - PPO | Admitting: Family Medicine

## 2018-12-07 ENCOUNTER — Other Ambulatory Visit: Payer: Self-pay

## 2018-12-10 ENCOUNTER — Other Ambulatory Visit: Payer: Self-pay

## 2018-12-10 ENCOUNTER — Encounter: Payer: Self-pay | Admitting: Medical

## 2018-12-10 ENCOUNTER — Telehealth: Payer: Self-pay | Admitting: Medical

## 2018-12-10 ENCOUNTER — Ambulatory Visit (INDEPENDENT_AMBULATORY_CARE_PROVIDER_SITE_OTHER): Payer: Commercial Managed Care - PPO | Admitting: Medical

## 2018-12-10 VITALS — Temp 97.9°F | Ht 65.0 in | Wt 215.0 lb

## 2018-12-10 DIAGNOSIS — R0602 Shortness of breath: Secondary | ICD-10-CM | POA: Insufficient documentation

## 2018-12-10 DIAGNOSIS — R7989 Other specified abnormal findings of blood chemistry: Secondary | ICD-10-CM | POA: Insufficient documentation

## 2018-12-10 DIAGNOSIS — R5383 Other fatigue: Secondary | ICD-10-CM | POA: Diagnosis not present

## 2018-12-10 DIAGNOSIS — Z8679 Personal history of other diseases of the circulatory system: Secondary | ICD-10-CM | POA: Diagnosis not present

## 2018-12-10 DIAGNOSIS — U071 COVID-19: Secondary | ICD-10-CM | POA: Insufficient documentation

## 2018-12-10 NOTE — Progress Notes (Signed)
Subjective:     Patient ID: Bobby Kelly, male   DOB: Aug 13, 1980, 38 y.o.   MRN: [id number]  This visit type was conducted due to national recommendations for restrictions regarding the COVID-19 Pandemic (e.g. social distancing) in an effort to limit this patient's exposure and mitigate transmission in our community.  Due to their co-morbid illnesses, this patient is at least at moderate risk for complications without adequate follow up.  This format is felt to be most appropriate for this patient at this time.    Documentation for virtual audio and video telecommunications through Zoom encounter:  The patient was located at home. The provider was located in the office. The patient did consent to this visit and is aware of possible charges through their insurance for this visit.  The other persons participating in this telemedicine service were none. Time spent on call was 15 minutes and in review of previous records >15 minutes total.  This virtual service is not related to other E/M service within previous 7 days.   HPI Chief Complaint  Patient presents with  . Headache    with dizziness,sob, +covid 3 weeks ago   Virtual consult today.  He was tested positive Covid about 3 weeks ago.  Tested on November 18, 2018.  His symptoms initially were a few days of cough, back pain, fatigue, some soreness.  Currently he continues to have some dizziness occasionally, some difficulty breathing, fatigue.  He denies calf swelling, no edema, no hemoptysis, no chest pain, no fever no body aches or chills currently otherwise.  No recent long travel, no recent trauma or injury.  No recent surgery. He is compliant with CPAP.  Heart rate has been staying in the 90 range.  No history of DVT/PE.  Does have history of congestive heart failure.  No other aggravating or relieving factors. No other complaint.  Past Medical History:  Diagnosis Date  . Allergy   . CHF (congestive heart failure) (HCC) 2018  .  Cholelithiasis 2018   cholecystectomy with complication, drain   . Dry skin    nasal folds  . Hypertension   . OSA (obstructive sleep apnea)   . Thyroid disease    hyperactive in high school, had oral therapy.    . Wears contact lenses    Current Outpatient Medications on File Prior to Visit  Medication Sig Dispense Refill  . aspirin EC 325 MG tablet Patient took one dose prior to admisssion    . losartan (COZAAR) 25 MG tablet TAKE 1 TABLET BY MOUTH DAILY 90 tablet 0  . metoprolol succinate (TOPROL-XL) 25 MG 24 hr tablet TAKE 1 TABLET BY MOUTH DAILY. TAKE WITH OR IMMEDIATELY FOLLOWING A MEAL 30 tablet 11  . nitroGLYCERIN (NITROSTAT) 0.4 MG SL tablet Place 1 tablet under tongue every 5 minutes prn for chest pain up to 2 times and if no improvement, go to the emergency room 50 tablet 3  . Propylene Glycol (SYSTANE COMPLETE OP) Place 1-2 drops into both eyes daily.    Marland Kitchen acetaminophen (TYLENOL) 325 MG tablet Take 325-650 mg by mouth every 6 (six) hours as needed for mild pain, moderate pain or headache.      No current facility-administered medications on file prior to visit.     Review of Systems As in subjective    Objective:   Physical Exam Due to coronavirus pandemic stay at home measures, patient visit was virtual and they were not examined in person.   Temp 97.9 F (36.6 C)  Ht 5\' 5"  (1.651 m)   Wt 215 lb (97.5 kg)   BMI 35.78 kg/m   Wt Readings from Last 3 Encounters:  12/10/18 215 lb (97.5 kg)  06/05/18 212 lb (96.2 kg)  05/10/18 212 lb (96.2 kg)       Assessment:     Encounter Diagnoses  Name Primary?  . COVID-19 virus infection Yes  . SOB (shortness of breath)   . Other fatigue   . History of heart failure   . Elevated LFTs        Plan:     I reviewed records in care everywhere from December 03, 2018 visit.  Chest x-ray was normal.  Echocardiogram showed LV ejection fraction 55%, no motion abnormalities.  CBC was normal, BNP was normal at 14,  comprehensive metabolic panel showed elevated liver test, ALT 106, AST 53, glucose slightly elevated otherwise normal.  We discussed the limitations of virtual consult.  Based on his history he does not feel any worse than he did last week, and is about 3 weeks out from positive Covid test.  We discussed symptoms that would prompt additional evaluation.  I gave him the option of coming in for in person exam vital signs and possibly repeating some labs.  He declines at this point.  He wants to work on rest, hydration, and giving it a little more time to improve.  We discussed that Covid symptoms including fatigue can take weeks to resolve.  Advised if he calls back and is not improving or wants to go further in the next few days we will have him come in for vital signs, brief exam, repeat comprehensive metabolic panel and CBC, consider chest x-ray and EKG.  After his virtual consult, I reviewed additional records regarding prior elevated liver test.  He had gallbladder surgery was complication drain tube in place in 2018.  His recent blood test showed elevated liver tests.  I reviewed 2019 CT abdomen and pelvis.  We will call him back with some additional questions  Bobby Kelly was seen today for headache.  Diagnoses and all orders for this visit:  COVID-19 virus infection  SOB (shortness of breath)  Other fatigue  History of heart failure  Elevated LFTs

## 2018-12-10 NOTE — Telephone Encounter (Signed)
Pt scheduled  

## 2018-12-10 NOTE — Telephone Encounter (Signed)
In that case, lets get him in tomorrow for in person exam, possibly labs, etc.

## 2018-12-10 NOTE — Telephone Encounter (Signed)
Please call them back as a follow-up from today's virtual visit.  His liver tests were elevated on his recent labs at [facility].  He has a history of gallbladder surgery and subsequent drain complication.  However our last check on liver test was a year ago, but the recent labs showed higher elevation of the liver test.  Is he having any right upper quadrant belly pain, is he having nausea or vomiting, any jaundice or yellowing of the skin?  When his last time he saw gastroenterology or liver doctor?

## 2018-12-10 NOTE — Telephone Encounter (Signed)
Patient stated he is having upper right belly pain with nausea and no vomiting. His eyes are yellowish and he has not seen GI in over a year.

## 2018-12-11 ENCOUNTER — Encounter: Payer: Self-pay | Admitting: Medical

## 2018-12-11 ENCOUNTER — Ambulatory Visit (INDEPENDENT_AMBULATORY_CARE_PROVIDER_SITE_OTHER): Payer: Commercial Managed Care - PPO | Admitting: Medical

## 2018-12-11 VITALS — BP 114/76 | HR 110 | Temp 97.7°F | Ht 65.0 in | Wt 222.8 lb

## 2018-12-11 DIAGNOSIS — Z9989 Dependence on other enabling machines and devices: Secondary | ICD-10-CM

## 2018-12-11 DIAGNOSIS — Z23 Encounter for immunization: Secondary | ICD-10-CM | POA: Diagnosis not present

## 2018-12-11 DIAGNOSIS — R0602 Shortness of breath: Secondary | ICD-10-CM | POA: Diagnosis not present

## 2018-12-11 DIAGNOSIS — R42 Dizziness and giddiness: Secondary | ICD-10-CM | POA: Diagnosis not present

## 2018-12-11 DIAGNOSIS — R5383 Other fatigue: Secondary | ICD-10-CM | POA: Diagnosis not present

## 2018-12-11 DIAGNOSIS — Z8679 Personal history of other diseases of the circulatory system: Secondary | ICD-10-CM

## 2018-12-11 DIAGNOSIS — R7989 Other specified abnormal findings of blood chemistry: Secondary | ICD-10-CM

## 2018-12-11 DIAGNOSIS — G4733 Obstructive sleep apnea (adult) (pediatric): Secondary | ICD-10-CM

## 2018-12-11 DIAGNOSIS — Z6836 Body mass index (BMI) 36.0-36.9, adult: Secondary | ICD-10-CM

## 2018-12-11 DIAGNOSIS — U071 COVID-19: Secondary | ICD-10-CM

## 2018-12-11 NOTE — Progress Notes (Signed)
Subjective: Chief Complaint  Patient presents with  . Abdominal Pain    with weakness    Here today as a follow-up from yesterday's virtual consult.   He was tested positive Covid about 3 weeks ago.  Tested on November 18, 2018.  His symptoms initially were a few days of cough, back pain, fatigue, some soreness.    Currently he reports ongoing loose stool typically 1 loose stool a day, some dizziness on and off, mild, ongoing fatigue that is moderate, shortness of breath at times, some belly pain at times, some nausea, occasional chest pain mild.  Had some blood in the stool a few weeks ago that was bright red but this resolved.  Only had this once.  He feels like his symptoms continue from the Covid diagnosis.  He never got significantly ill from Covid.  Never had cough.   He has a history of heart failure back in 2018, last year he dealt with gallbladder disease and subsequent drain and complication.  Last consult with surgeon and GI was probably April 2019  He does report some anxiety .  Feels like he should probably use some medication to help for now.  No prior medication to help with nerves.  No prior counseling.    He is compliant with CPAP.  Heart rate has been staying in the 90 range.  No history of DVT/PE.  Does have history of congestive heart failure.  No other aggravating or relieving factors. No other complaint.  No ETOH in a month, few beers per night.  He notes hx/o hepatitis age 22yo in [place]  Past Medical History:  Diagnosis Date  . Allergy   . CHF (congestive heart failure) (HCC) 2018  . Cholelithiasis 2018   cholecystectomy with complication, drain   . Dry skin    nasal folds  . Hypertension   . OSA (obstructive sleep apnea)   . Thyroid disease    hyperactive in high school, had oral therapy.    . Wears contact lenses    Current Outpatient Medications on File Prior to Visit  Medication Sig Dispense Refill  . acetaminophen (TYLENOL) 325 MG tablet Take 325-650  mg by mouth every 6 (six) hours as needed for mild pain, moderate pain or headache.     Marland Kitchen aspirin EC 325 MG tablet Patient took one dose prior to admisssion    . losartan (COZAAR) 25 MG tablet TAKE 1 TABLET BY MOUTH DAILY 90 tablet 0  . metoprolol succinate (TOPROL-XL) 25 MG 24 hr tablet TAKE 1 TABLET BY MOUTH DAILY. TAKE WITH OR IMMEDIATELY FOLLOWING A MEAL 30 tablet 11  . nitroGLYCERIN (NITROSTAT) 0.4 MG SL tablet Place 1 tablet under tongue every 5 minutes prn for chest pain up to 2 times and if no improvement, go to the emergency room 50 tablet 3  . Propylene Glycol (SYSTANE COMPLETE OP) Place 1-2 drops into both eyes daily.     No current facility-administered medications on file prior to visit.    Past Surgical History:  Procedure Laterality Date  . APPENDECTOMY    . CHOLECYSTECTOMY N/A 12/03/2016   Procedure: LAPAROSCOPIC CHOLECYSTECTOMY;  Surgeon: Violeta Gelinas, MD;  Location[facility] OR;  Service: General;  Laterality: N/A;  . ERCP N/A 12/19/2016   Procedure: ENDOSCOPIC RETROGRADE CHOLANGIOPANCREATOGRAPHY (ERCP);  Surgeon: Vida Rigger, MD;  Location: [facility] ENDOSCOPY;  Service: Endoscopy;  Laterality: N/A;  . ESOPHAGOGASTRODUODENOSCOPY (EGD) WITH PROPOFOL N/A 12/13/2016   Procedure: ESOPHAGOGASTRODUODENOSCOPY (EGD) WITH PROPOFOL;  Surgeon: Willis Modena, MD;  Location: [facility] ENDOSCOPY;  Service: Endoscopy;  Laterality: N/A;  . ESOPHAGOGASTRODUODENOSCOPY (EGD) WITH PROPOFOL N/A 12/15/2016   Procedure: ESOPHAGOGASTRODUODENOSCOPY (EGD) WITH PROPOFOL;  Surgeon: Arta Silence, MD;  Location: [place];  Service: Endoscopy;  Laterality: N/A;  . ESOPHAGOGASTRODUODENOSCOPY (EGD) WITH PROPOFOL N/A 04/11/2017   Procedure: ESOPHAGOGASTRODUODENOSCOPY (EGD) WITH PROPOFOL;  Surgeon: Clarene Essex, MD;  Location: [place];  Service: Endoscopy;  Laterality: N/A;  side viewing scope- biliary stent removal  . IR CATHETER TUBE CHANGE  12/23/2016  . IR RADIOLOGIST EVAL & MGMT  01/03/2017  . IR RADIOLOGIST  EVAL & MGMT  01/17/2017  . IR RADIOLOGIST EVAL & MGMT  01/25/2017  . IR RADIOLOGIST EVAL & MGMT  02/09/2017  . IR RADIOLOGIST EVAL & MGMT  02/21/2017  . IR THORACENTESIS ASP PLEURAL SPACE W/IMG GUIDE  12/23/2016     ROS as in   Objective: BP 114/76   Pulse (!) 110   Temp 97.7 F (36.5 C)   Ht 5\' 5"  (1.651 m)   Wt 222 lb 12.8 oz (101.1 kg)   SpO2 97%   BMI 37.08 kg/m   Wt Readings from Last 3 Encounters:  12/11/18 222 lb 12.8 oz (101.1 kg)  12/10/18 215 lb (97.5 kg)  06/05/18 212 lb (96.2 kg)   General appearance: alert, no distress, WD/WN,  No jaundice HEENT: normocephalic, sclerae anicteric, TMs pearly, nares patent, no discharge or erythema, pharynx normal Oral cavity: MMM, no lesions Neck: supple, no lymphadenopathy, no thyromegaly, no masses Heart: RRR, normal S1, S2, no murmurs Lungs: CTA bilaterally, no wheezes, rhonchi, or rales Abdomen: +bs, soft, mild left upper abdominal tendermess, right mid and upper abdomen port surgical scars, RLQ appy surgical scar, otherwise non tender, non distended, no masses, no hepatomegaly, no splenomegaly Pulses: 2+ symmetric, upper and lower extremities, normal cap refill No back tenderness No LE edema, no calve tenderness, no LE swelling or asymmetry, neg homans     Assessment: Encounter Diagnoses  Name Primary?  . SOB (shortness of breath) Yes  . Elevated LFTs   . Other fatigue   . Dizziness   . History of heart failure   . COVID-19 virus infection   . Class 2 severe obesity with serious comorbidity and body mass index (BMI) of 36.0 to 36.9 in adult, unspecified obesity type ([place])   . OSA on CPAP   . Need for influenza vaccination      Plan: I reviewed recent records in care everywhere including recent chest xray, echocardiogram, labs, chest xray.  I suspect the ongoing symptoms are residual from Covid and not other new issue.   His pulse improved after a while of resting but was tachycardic upon triage today.   No signs  of pericarditis or CHF on recent ED visit.   EKG reviewed.    Anxiety - pending labs, consider medication options.  Advised counseling.   Elevated LFTs - limit alcohol, eat a healthy low fat diet, f/u pending labs  counseling on diet, exercise   osa- c/t CPAP  Counseled on the influenza virus vaccine.  Vaccine information sheet given.  Influenza vaccine given after consent obtained.    Rolen was seen today for abdominal pain.  Diagnoses and all orders for this visit:  SOB (shortness of breath) -     Lipase -     TSH -     D-dimer, quantitative (not at [facility]) -     Sedimentation rate -     EKG 12-Lead  Elevated LFTs -  Hepatitis panel, acute -     Lipase  Other fatigue -     TSH  Dizziness -     TSH  History of heart failure -     Sedimentation rate -     EKG 12-Lead  COVID-19 virus infection  Class 2 severe obesity with serious comorbidity and body mass index (BMI) of 36.0 to 36.9 in adult, unspecified obesity type (HCC)  OSA on CPAP  Need for influenza vaccination -     Flu Vaccine QUAD 6+ mos PF IM (Fluarix Quad PF)   F/u pending labs

## 2018-12-12 ENCOUNTER — Other Ambulatory Visit: Payer: Self-pay | Admitting: Medical

## 2018-12-12 LAB — TSH: TSH: 1.29 u[IU]/mL (ref 0.450–4.500)

## 2018-12-12 LAB — HEPATITIS PANEL, ACUTE
Hep A IgM: NEGATIVE
Hep B C IgM: NEGATIVE
Hep C Virus Ab: <0.1 s/co ratio (ref 0.0–0.9)
Hepatitis B Surface Ag: NEGATIVE

## 2018-12-12 LAB — SEDIMENTATION RATE: Sed Rate: 15 mm/hr (ref 0–15)

## 2018-12-12 LAB — LIPASE: Lipase: 46 U/L (ref 13–78)

## 2018-12-12 LAB — D-DIMER, QUANTITATIVE: D-DIMER: 0.2 mg/L FEU (ref 0.00–0.49)

## 2018-12-12 MED ORDER — FLUOXETINE HCL 10 MG PO CAPS: 10.0000 mg | ORAL_CAPSULE | Freq: Every day | ORAL | 2 refills | Status: DC

## 2019-01-08 ENCOUNTER — Other Ambulatory Visit: Payer: Self-pay

## 2019-01-08 MED ORDER — LOSARTAN POTASSIUM 25 MG PO TABS: 25.0000 mg | ORAL_TABLET | Freq: Every day | ORAL | 3 refills | Status: DC

## 2019-02-25 IMAGING — CT CT ABD-PELV W/ CM
2 of 4 series · 15 of 46 positions shown, 17 images · IV contrast (APPLIED)
Comparison: Abdominal ultrasound dated November 26, 2016.

CLINICAL DATA: Abdominal pain. Recent cholecystectomy on
12/03/2016.

EXAM:
CT ABDOMEN AND PELVIS WITH CONTRAST
TECHNIQUE: Multidetector CT imaging of the abdomen and pelvis was performed
using the standard protocol following bolus administration of
intravenous contrast.
CONTRAST:  < 100 cc> ORBPCD-5SS IOPAMIDOL (ORBPCD-5SS) INJECTION 61%

[Series 3: abd/ pelvis 5.0 i30f 2 · axial · 0.90mm/px · z∈[+651,+1096]mm · 12 of 99 slices shown, 14 images]
[im 5/99  soft-tissue]
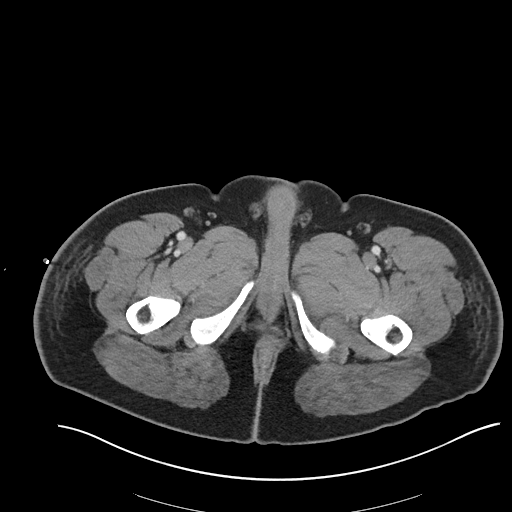
[im 5/99  bone]
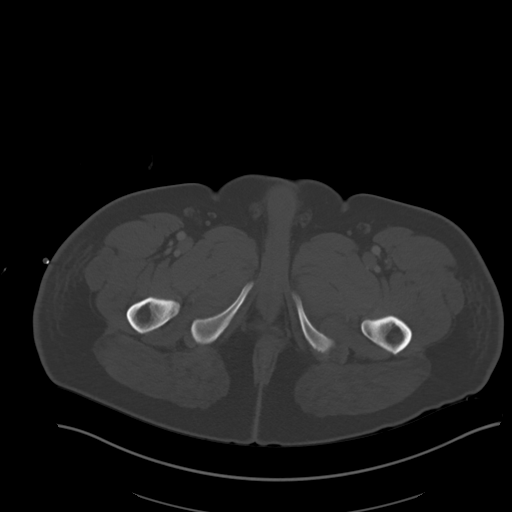
[im 15/99  soft-tissue]
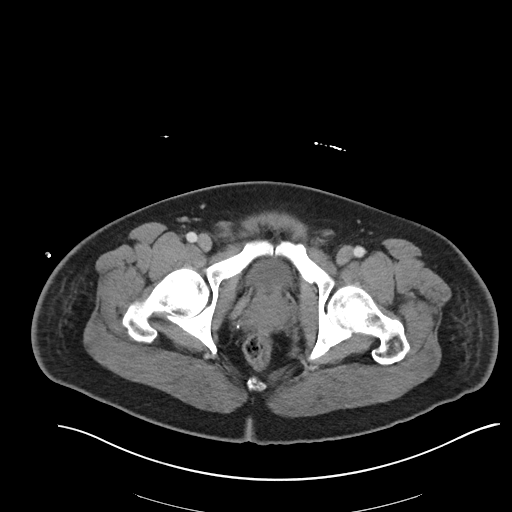
[im 24/99  soft-tissue]
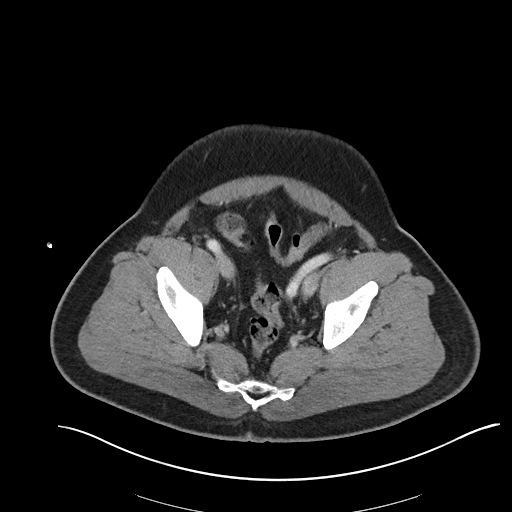
[im 29/99  soft-tissue]
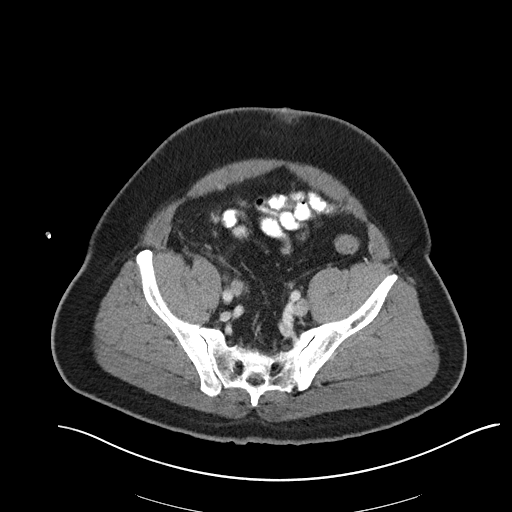
[im 38/99  soft-tissue]
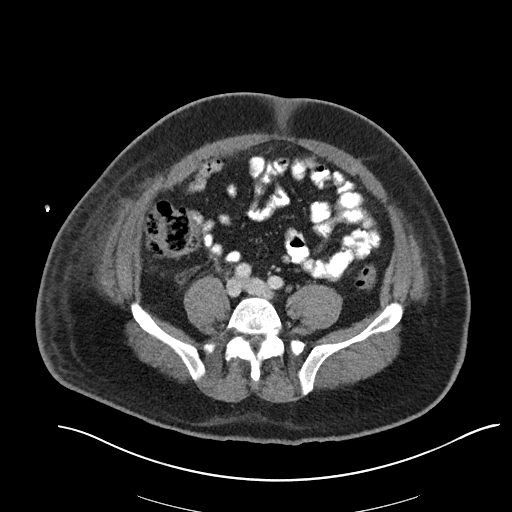
[im 47/99  soft-tissue]
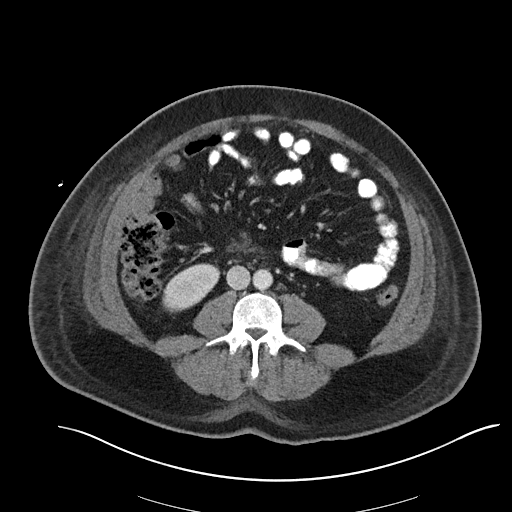
[im 52/99  soft-tissue]
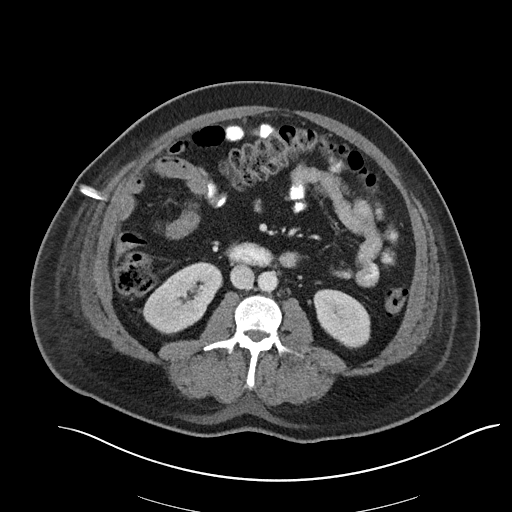
[im 61/99  soft-tissue]
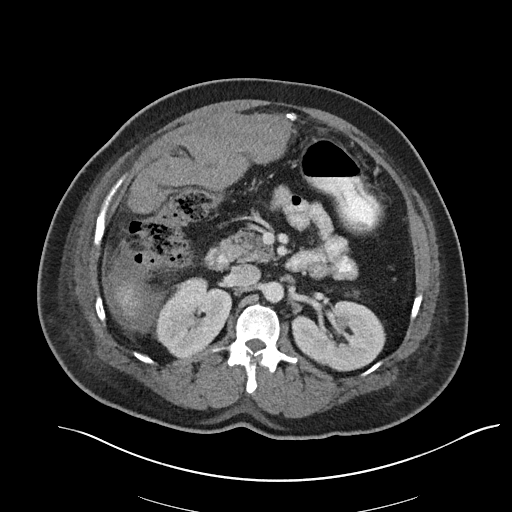
[im 71/99  soft-tissue]
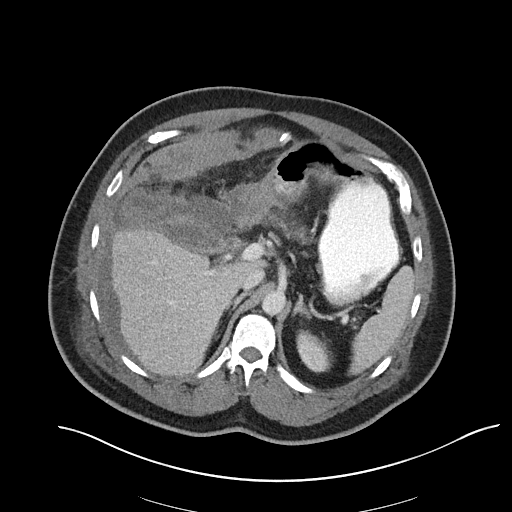
[im 71/99  bone]
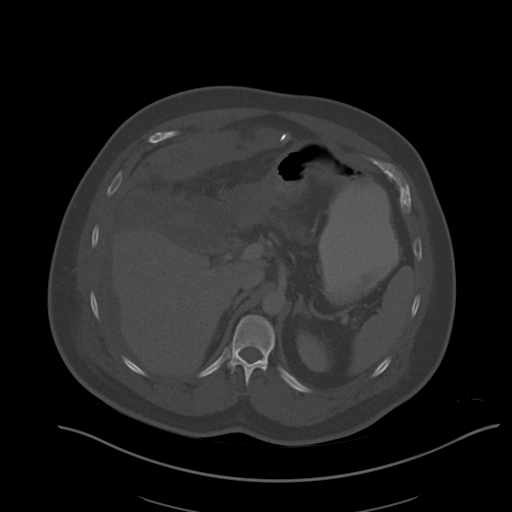
[im 75/99  soft-tissue]
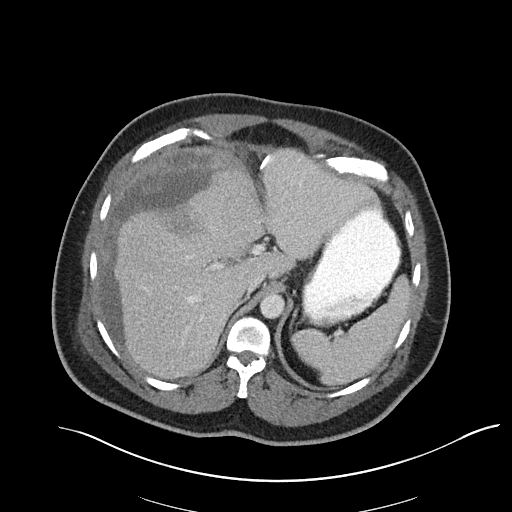
[im 85/99  soft-tissue]
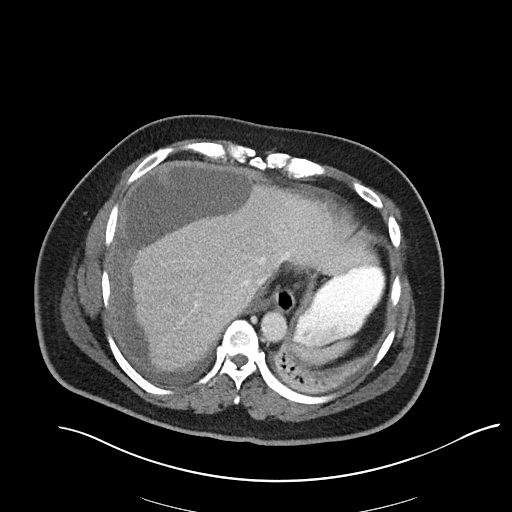
[im 94/99  soft-tissue]
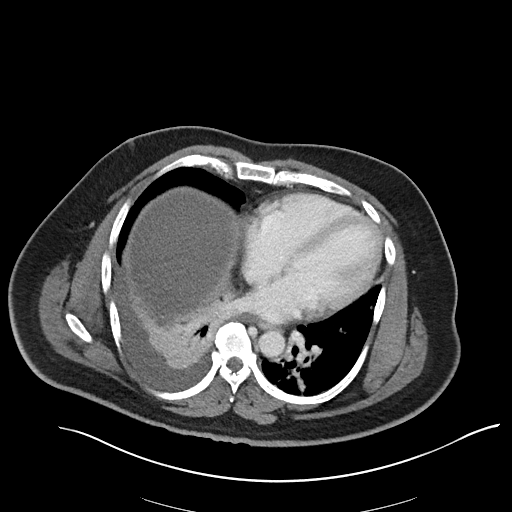

[Series 6: coronal soft tissue · coronal · 0.77mm/px · 3 of 101 slices shown]
[im 34/101  soft-tissue]
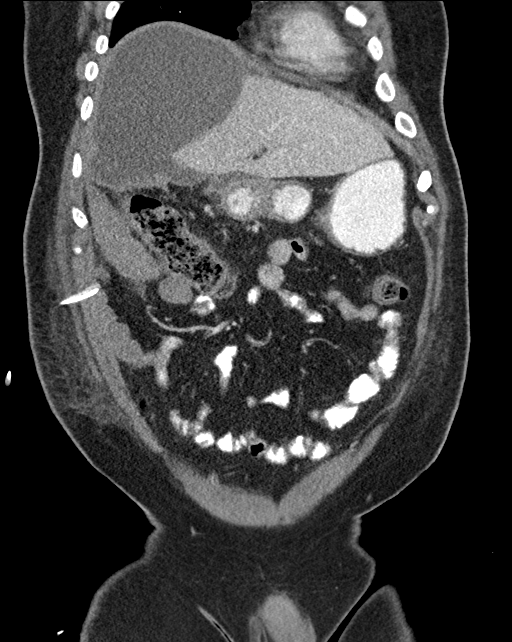
[im 45/101  soft-tissue]
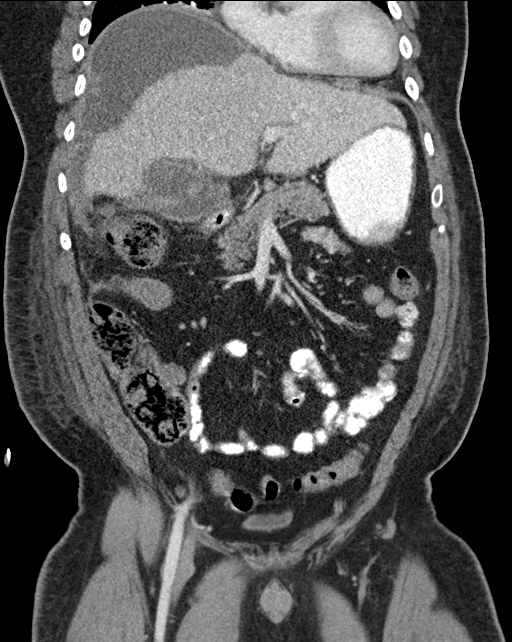
[im 56/101  soft-tissue]
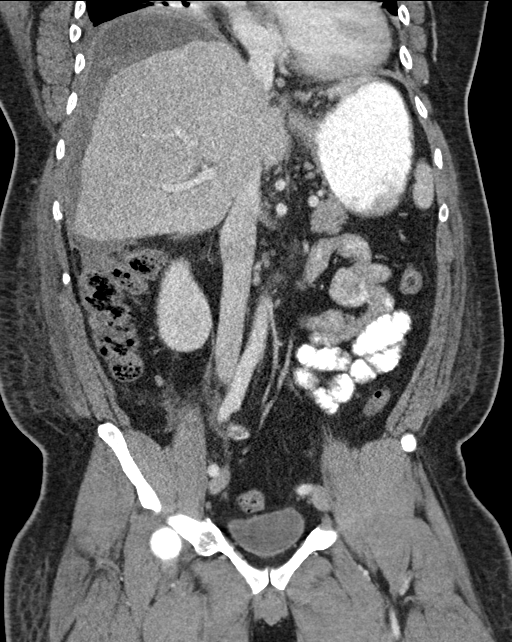

[15 of 46 positions shown; findings below may reference images not displayed]

FINDINGS: Lower chest: Small right pleural effusion. Bilateral lower lobe
subsegmental atelectasis.

Hepatobiliary: No focal liver abnormality. The gallbladder is
surgically absent. No biliary dilatation.

There is a large, loculated, thick-walled fluid collection in the
right upper quadrant which exerts mass effect on the liver. The
collection extends into the gallbladder fossa, and along the
anterior abdominal wall, where it is more hyperdense. The more
superior, lower density component of the fluid collection measures
approximately 19.5 x 8.3 x 13.1 cm (AP by transverse by CC), while
the more hyperdense, inferior component along the anterior abdominal
wall measures approximately 6.4 x 17.4 x 8.3 cm (AP by transverse by
CC).

Pancreas: Unremarkable. No pancreatic ductal dilatation or
surrounding inflammatory changes.

Spleen: Normal in size without focal abnormality.

Adrenals/Urinary Tract: Adrenal glands are unremarkable. Kidneys are
normal, without renal calculi, focal lesion, or hydronephrosis.
Bladder is decompressed.

Stomach/Bowel: Stomach is within normal limits. Appendix is
surgically absent. No evidence of bowel wall thickening, distention,
or inflammatory changes.

Vascular/Lymphatic: No significant vascular findings are present. No
enlarged abdominal or pelvic lymph nodes.

Reproductive: Prostate is unremarkable.

Other: No pneumoperitoneum. Surgical drain in the right upper
quadrant.

Musculoskeletal: No acute or significant osseous findings.
IMPRESSION: 1. Recent cholecystectomy with thick walled, loculated, complex
fluid collection in the right upper quadrant and gallbladder fossa,
exerting mass effect on the liver. The superior, more low-density
component measuring up to 19.5 cm is concerning for abscess, with
biloma also in the differential. The inferior, more hyperdense
component along the anterior abdominal wall measuring up to 17.4 cm
is suspicious for hematoma.
2. Small right pleural effusion. Bilateral lower lobe subsegmental
atelectasis.

## 2019-03-01 IMAGING — NM NM HEPATOBILIARY IMAGE, INC GB
1 series · 6 of 6 positions shown · non-contrast
Comparison: CT scan 12/08/2016

CLINICAL DATA: Cholecystectomy, now of perihepatic fluid
collection.

EXAM:
NUCLEAR MEDICINE HEPATOBILIARY IMAGING
TECHNIQUE: Sequential images of the abdomen were obtained [DATE] minutes
following intravenous administration of radiopharmaceutical.
RADIOPHARMACEUTICALS:  5.6 mCi Sc-SSm  Choletec IV

[he hepatobiliary · 3.10mm/px · 6 of 60 frames shown]
[frame 6/60]
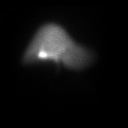
[frame 16/60]
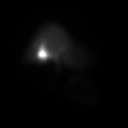
[frame 26/60]
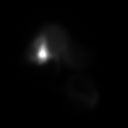
[frame 36/60]
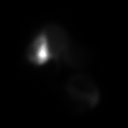
[frame 46/60]
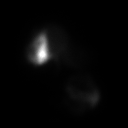
[frame 56/60]
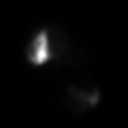

[6 of 6 positions shown; findings below may reference images not displayed]

FINDINGS: There is prompt uptake of radiopharmaceutical from the blood pool.
Biliary tree and bowel activity noted at 5 minutes.

At 5 minutes, and progressing over the next hour, there is filling
of an abnormal large collection along the right side of the liver,
compatible with bile leak into the large fluid collection shown on
CT scan. This is not shown to track down along the right paracolic
gutter.
IMPRESSION: 1. Bile leak into a large collection along the right side of the
liver.

## 2019-03-13 ENCOUNTER — Other Ambulatory Visit: Payer: Self-pay | Admitting: Medical

## 2019-04-07 ENCOUNTER — Encounter: Payer: Self-pay | Admitting: Cardiology

## 2019-04-07 DIAGNOSIS — I10 Essential (primary) hypertension: Secondary | ICD-10-CM | POA: Insufficient documentation

## 2019-04-08 ENCOUNTER — Encounter: Payer: Self-pay | Admitting: Cardiology

## 2019-04-08 ENCOUNTER — Telehealth (INDEPENDENT_AMBULATORY_CARE_PROVIDER_SITE_OTHER): Payer: Commercial Managed Care - PPO | Admitting: Cardiology

## 2019-04-08 VITALS — Ht 65.0 in | Wt 217.0 lb

## 2019-04-08 DIAGNOSIS — G4733 Obstructive sleep apnea (adult) (pediatric): Secondary | ICD-10-CM | POA: Diagnosis not present

## 2019-04-08 DIAGNOSIS — R000 Tachycardia, unspecified: Secondary | ICD-10-CM

## 2019-04-08 DIAGNOSIS — I10 Essential (primary) hypertension: Secondary | ICD-10-CM | POA: Diagnosis not present

## 2019-04-08 DIAGNOSIS — Z9989 Dependence on other enabling machines and devices: Secondary | ICD-10-CM | POA: Diagnosis not present

## 2019-04-08 DIAGNOSIS — U071 COVID-19: Secondary | ICD-10-CM

## 2019-04-08 DIAGNOSIS — I428 Other cardiomyopathies: Secondary | ICD-10-CM | POA: Diagnosis not present

## 2019-04-08 NOTE — Progress Notes (Signed)
Virtual Visit via Telephone Note   This visit type was conducted due to national recommendations for restrictions regarding the COVID-19 Pandemic (e.g. social distancing) in an effort to limit this patient's exposure and mitigate transmission in our community.  Due to his co-morbid illnesses, this patient is at least at moderate risk for complications without adequate follow up.  This format is felt to be most appropriate for this patient at this time.  The patient did not have access to video technology/had technical difficulties with video requiring transitioning to audio format only (telephone).  All issues noted in this document were discussed and addressed.  No physical exam could be performed with this format.  Please refer to the patient's chart for his  consent to telehealth for [facility].   Date:  04/08/2019   ID:  Bobby Kelly, DOB 09-09-1980, MRN [id number]  Patient Location: Home Provider Location: Home  PCP:  Jac Canavan, PA-C  Cardiologist:  Chilton Si, MD  Electrophysiologist:  None   Evaluation Performed:  Follow-Up Visit  Chief Complaint:  none  History of Present Illness:    Bobby Kelly is a 39 y.o. male with a past history of sinus tachycardia. The patient was initially seen in 2018 for tachycardia, monitoring did not reveal any arrhythmias.  His antihypertensive medications were changed to losartan and metoprolol. At that time he had a cardiomyopathy with an ejection fraction of 35 to 40% by echo March 2018.  It was suspected this was secondary to tachycardia. He had a low risk Myoview in August 2018 and had no coronary calcification on prior CT scan.  A follow-up echocardiogram was done in March 2019 that showed an ejection fraction of 50 to 55%.   He last saw Dr. Duke Salvia in April 2020 and was doing well at that time.  He was contacted today for routine follow-up.  Since his last office visit he is done well except for a period in October 2020 when he  was diagnosed with COVID.  His complaints then were headache and tachycardia.  He had no fevers.  He did have some persistent fatigue and felt like his heart rate was elevated- around 100. He has a fit bit to monitor his HR.  This lasted for period of 4 to 6 weeks and then resolved.  His heart rate today is 75.  His blood pressures been controlled.  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  He plans to start again once the weather warms up and it stops draining.  The patient does not have symptoms concerning for COVID-19 infection (fever, chills, cough, or new shortness of breath).    Past Medical History:  Diagnosis Date  . Allergy   . CHF (congestive heart failure) (HCC) 2018  . Cholelithiasis 2018   cholecystectomy with complication, drain   . Dry skin    nasal folds  . Hypertension   . OSA (obstructive sleep apnea)   . Thyroid disease    hyperactive in high school, had oral therapy.    . Wears contact lenses    Past Surgical History:  Procedure Laterality Date  . APPENDECTOMY    . CHOLECYSTECTOMY N/A 12/03/2016   Procedure: LAPAROSCOPIC CHOLECYSTECTOMY;  Surgeon: Violeta Gelinas, MD;  Location: [facility] OR;  Service: General;  Laterality: N/A;  . ERCP N/A 12/19/2016   Procedure: ENDOSCOPIC RETROGRADE CHOLANGIOPANCREATOGRAPHY (ERCP);  Surgeon: Vida Rigger, MD;  Location: [facility] ENDOSCOPY;  Service: Endoscopy;  Laterality: N/A;  .  ESOPHAGOGASTRODUODENOSCOPY (EGD) WITH PROPOFOL N/A 12/13/2016   Procedure: ESOPHAGOGASTRODUODENOSCOPY (EGD) WITH PROPOFOL;  Surgeon: Arta Silence, MD;  Location: [place];  Service: Endoscopy;  Laterality: N/A;  . ESOPHAGOGASTRODUODENOSCOPY (EGD) WITH PROPOFOL N/A 12/15/2016   Procedure: ESOPHAGOGASTRODUODENOSCOPY (EGD) WITH PROPOFOL;  Surgeon: Arta Silence, MD;  Location: [place];  Service: Endoscopy;  Laterality: N/A;  . ESOPHAGOGASTRODUODENOSCOPY (EGD) WITH PROPOFOL N/A 04/11/2017   Procedure: ESOPHAGOGASTRODUODENOSCOPY  (EGD) WITH PROPOFOL;  Surgeon: Clarene Essex, MD;  Location: [place];  Service: Endoscopy;  Laterality: N/A;  side viewing scope- biliary stent removal  . IR CATHETER TUBE CHANGE  12/23/2016  . IR RADIOLOGIST EVAL & MGMT  01/03/2017  . IR RADIOLOGIST EVAL & MGMT  01/17/2017  . IR RADIOLOGIST EVAL & MGMT  01/25/2017  . IR RADIOLOGIST EVAL & MGMT  02/09/2017  . IR RADIOLOGIST EVAL & MGMT  02/21/2017  . IR THORACENTESIS ASP PLEURAL SPACE W/IMG GUIDE  12/23/2016     Current Meds  Medication Sig  . acetaminophen (TYLENOL) 325 MG tablet Take 325-650 mg by mouth every 6 (six) hours as needed for mild pain, moderate pain or headache.   . losartan (COZAAR) 25 MG tablet Take 1 tablet (25 mg total) by mouth daily.  . metoprolol succinate (TOPROL-XL) 25 MG 24 hr tablet TAKE 1 TABLET BY MOUTH DAILY. TAKE WITH OR IMMEDIATELY FOLLOWING A MEAL  . nitroGLYCERIN (NITROSTAT) 0.4 MG SL tablet Place 1 tablet under tongue every 5 minutes prn for chest pain up to 2 times and if no improvement, go to the emergency room  . Propylene Glycol (SYSTANE COMPLETE OP) Place 1-2 drops into both eyes daily.     Allergies:   Patient has no known allergies.   Social History   Tobacco Use  . Smoking status: Never Smoker  . Smokeless tobacco: Never Used  Substance Use Topics  . Alcohol use: Yes    Alcohol/week: 4.0 standard drinks    Types: 3 Cans of beer, 1 Shots of liquor per week    Comment: occ, history of 2-3 years of heavier alcohol  . Drug use: No     Family Hx: The patient's family history includes Cancer in his maternal grandmother; Cataracts in his mother; Diabetes in an other family member; Hyperlipidemia in his father; Hypertension in his mother; Pancreatic cancer in his maternal grandmother. There is no history of Heart disease or Stroke.  ROS:   Please see the history of present illness.    All other systems reviewed and are negative.   Prior CV studies:   The following studies were reviewed  today: Echo March 2019  Labs/Other Tests and Data Reviewed:    EKG:  An ECG dated 12/13/2018 was personally reviewed today and demonstrated:  NSR- HR 86  Recent Labs: 04/25/2018: ALT 56; BUN 17; Creatinine, Ser 0.88; Potassium 4.2; Sodium 140 12/11/2018: TSH 1.290   Recent Lipid Panel Lab Results  Component Value Date/Time   CHOL 175 11/06/2017 09:08 AM   TRIG 104 11/06/2017 09:08 AM   HDL 59 11/06/2017 09:08 AM   CHOLHDL 3.0 11/06/2017 09:08 AM   CHOLHDL 3.3 11/03/2016 08:11 AM   LDLCALC 95 11/06/2017 09:08 AM   LDLCALC 94 11/03/2016 08:11 AM    Wt Readings from Last 3 Encounters:  04/08/19 217 lb (98.4 kg)  12/11/18 222 lb 12.8 oz (101.1 kg)  12/10/18 215 lb (97.5 kg)     Objective:    Vital Signs:  Ht 5\' 5"  (1.651 m)   Wt 217 lb (  98.4 kg)   BMI 36.11 kg/m    VITAL SIGNS:  reviewed  ASSESSMENT & PLAN:    Tachycardia- Intermittent sinus tachycardia  Cardiomyopathy- EF 35-40% March 2018-  etiology suspected to be secondary to tachycardia with low risk Myoview Aug 2018, no coronary Ca++ on chest CT.   EF improved to 50-55% March 2019  HTN- Controlled on current medications  OSA- Reports compliance with C-pap  COVID- Mild case of COVID in Oct 2020  COVID-19 Education: The signs and symptoms of COVID-19 were discussed with the patient and how to seek care for testing (follow up with PCP or arrange E-visit).  The importance of social distancing was discussed today.  Time:   Today, I have spent 15 minutes with the patient with telehealth technology discussing the above problems.     Medication Adjustments/Labs and Tests Ordered: Current medicines are reviewed at length with the patient today.  Concerns regarding medicines are outlined above.   Tests Ordered: No orders of the defined types were placed in this encounter.   Medication Changes: No orders of the defined types were placed in this encounter.   Follow Up:  In Person one year-Dr  Finis Bud, Corine Shelter, PA-C  04/08/2019 8:37 AM    [facility]

## 2019-04-08 NOTE — Patient Instructions (Addendum)
Medication Instructions:  No changes *If you need a refill on your cardiac medications before your next appointment, please call your pharmacy*  Lab Work: None  Testing/Procedures: None  Follow-Up: At [facility], you and your health needs are our priority.  As part of our continuing mission to provide you with exceptional heart care, we have created designated Provider Care Teams.  These Care Teams include your primary Cardiologist (physician) and Advanced Practice Providers (APPs -  Physician Assistants and Nurse Practitioners) who all work together to provide you with the care you need, when you need it.  We recommend signing up for the patient portal called "MyChart".  Sign up information is provided on this After Visit Summary.  MyChart is used to connect with patients for Virtual Visits (Telemedicine).  Patients are able to view lab/test results, encounter notes, upcoming appointments, etc.  Non-urgent messages can be sent to your provider as well.   To learn more about what you can do with MyChart, go to [email or web address].    Your next appointment:   1 year(s)  You will receive a reminder letter in the mail two months in advance. If you don't receive a letter, please call our office to schedule the follow-up appointment.  The format for your next appointment:   In Person  Provider:    Chilton Si, MD

## 2019-04-09 DIAGNOSIS — R000 Tachycardia, unspecified: Secondary | ICD-10-CM | POA: Insufficient documentation

## 2019-09-19 ENCOUNTER — Encounter: Payer: Self-pay | Admitting: Family Medicine

## 2019-09-19 ENCOUNTER — Other Ambulatory Visit: Payer: Self-pay

## 2019-09-19 ENCOUNTER — Ambulatory Visit (INDEPENDENT_AMBULATORY_CARE_PROVIDER_SITE_OTHER): Payer: Commercial Managed Care - PPO | Admitting: Family Medicine

## 2019-09-19 VITALS — BP 130/90 | HR 80 | Temp 98.8°F | Ht 65.0 in | Wt 230.8 lb

## 2019-09-19 DIAGNOSIS — R194 Change in bowel habit: Secondary | ICD-10-CM

## 2019-09-19 DIAGNOSIS — R1012 Left upper quadrant pain: Secondary | ICD-10-CM

## 2019-09-19 DIAGNOSIS — Z7189 Other specified counseling: Secondary | ICD-10-CM

## 2019-09-19 DIAGNOSIS — Z7185 Encounter for immunization safety counseling: Secondary | ICD-10-CM

## 2019-09-19 LAB — POCT URINALYSIS DIP (PROADVANTAGE DEVICE)
Bilirubin, UA: NEGATIVE
Blood, UA: NEGATIVE
Glucose, UA: NEGATIVE mg/dL
Ketones, POC UA: NEGATIVE mg/dL
Leukocytes, UA: NEGATIVE
Nitrite, UA: NEGATIVE
Protein Ur, POC: NEGATIVE mg/dL
Specific Gravity, Urine: 1.015
Urobilinogen, Ur: NEGATIVE
pH, UA: 7 (ref 5.0–8.0)

## 2019-09-19 NOTE — Progress Notes (Signed)
Chief Complaint  Patient presents with  . Abdominal Pain    started Monday with left sided abdominal pain and diarrhea. Felt somewhat better Tuesday evening but became constipated. Wed evening he noticed some blood on his stool (normal stool, darker than usual) did take [employer] and Tues. Stool is a little softer today and saw some blood on toilet paper. Left sided abdominal pain is constant and dull and radiates around to the back.    He woke up 3 days ago (Monday) with rumbling stomach, slight discomfort LUQ, then started with diarrhea.  He had diarrhea 3x at work, and more at home.  He took [employer], which helped some, turned the stools dark. Tuesday he felt a little constipated--felt like he had to go but couldn't.  He had a fairly normal bowel movement Tuesday morning, none the rest of the day. Yesterday bowel was normal, but at the end of the day it was looser. Today stool was looser again.  Last night he noted blood on the toilet paper, and a small amount on the stool. He strained a little yesterday, and is straining some again today, though the stools are coming out loose (firmer at first, followed by some looser stool).  LUQ pain has been fairly constant, was worse on Tuesday morning, radiates around to the back. Currently he has no pain. Wife made him come. He isn't too concerned.  He took photos of one of the bowel movements--first portion of BM was solid, slightly wider caliber, later in same bathroom trip he had looser stools follow--some undigested food noted (green/herbs, orange pieces, corn).  There didn't appear to be any streaks/blood in the toilet water or mucus. There was something somewhat red/orange, but appeared more particulate.  He denies nausea, vomiting, sick contacts. Denies changes to his diet. Did have spicy food.    PMH, PSH, SH reviewed.  Labs reviewed from 12/2018.  He took prozac for only a little over a month for anxiety--reports feeling better.  Uses  CBD gummies occasionally for anxiety. Average 3-4 drinks/week  Outpatient Encounter Medications as of 09/19/2019  Medication Sig Note  . bismuth subsalicylate (PEPTO BISMOL) 262 MG/15ML suspension Take 30 mLs by mouth every 6 (six) hours as needed. 09/19/2019: Last dose Tues evening  . losartan (COZAAR) 25 MG tablet Take 1 tablet (25 mg total) by mouth daily.   . metoprolol succinate (TOPROL-XL) 25 MG 24 hr tablet TAKE 1 TABLET BY MOUTH DAILY. TAKE WITH OR IMMEDIATELY FOLLOWING A MEAL   . Propylene Glycol (SYSTANE COMPLETE OP) Place 1-2 drops into both eyes daily.   Marland Kitchen acetaminophen (TYLENOL) 325 MG tablet Take 325-650 mg by mouth every 6 (six) hours as needed for mild pain, moderate pain or headache.  (Patient not taking: Reported on 09/19/2019)   . nitroGLYCERIN (NITROSTAT) 0.4 MG SL tablet Place 1 tablet under tongue every 5 minutes prn for chest pain up to 2 times and if no improvement, go to the emergency room (Patient not taking: Reported on 09/19/2019)    No facility-administered encounter medications on file as of 09/19/2019.   No Known Allergies  ROS: no fever, chills, nausea, vomiting. +abdominal pain and bowel changes per HPI.  No urinary complaints, rashes, URI symptoms, headache, dizziness, chest pain or other concerns. See HPI   PHYSICAL EXAM:  BP 130/90   Pulse 80   Temp 98.8 F (37.1 C) (Tympanic)   Ht 5\' 5"  (1.651 m)   Wt 230 lb 12.8 oz (104.7 kg)  BMI 38.41 kg/m   Pleasant, well-appearing, obese male, in good spirits HEENT: conjunctiva and sclera are clear, anicteric, EOMI. Wearing mask Neck: no lymphadenopathy or mass Heart: regular rate and rhythm Lungs: clear bilaterally Back: no spinal or CVA tenderness Abdomen: Very mild LLQ tenderness. nontender in LUQ.  No rebound, guarding or mass.  Active bowel sounds Rectal: normal sphincter tone, no mass.  No external hemorrhoids or fissures noted.  No internal hemorrhoids palpable (anoscopy declined).  Stool was light  brown, soft, heme negative Neuro: alert and oriented, normal strength, gait Psych: normal mood, affect, hygiene and grooming  Normal urine dip  ASSESSMENT/PLAN:  Left upper quadrant abdominal pain - per report; on exam has mild LLQ discomfort. Suspect related to acute GI issue (viral, related to food). Ddx reviewed. F/u if worsening, fever, more blood - Plan: POCT Urinalysis DIP (Proadvantage Device)  Change in bowel habits - encouraged bland diet, no dairy/spicy/fried/greasy. Stool heme neg. f/u if increased pain/fever/blood/vomiting for further eval  Vaccine counseling - hesitant to get COVID vaccine; had COVID in 11/2018. Counseled in detail about COVID, risks, Delta variant and encouraged vaccination  35-40 min FTF visit, more than 1/2 spent counseling.

## 2019-09-19 NOTE — Patient Instructions (Addendum)
Drink plenty of water. Eat a bland diet--avoid dairy, avoid fried, greasy and spicy foods for the next few days. You can try simethicone (Gas-X) as needed for abdominal pain.  In general, follow a high fiber diet, and be sure to drink at least 6-8 glasses of water daily.  You will need additional evaluation if you have worsening abdominal pain, if you develop fever, if you start vomiting, or if there is ongoing rectal bleeding.  There was no evidence of external hemorrhoids or a fissure (though those can heal very quickly) on today's exam.  If you have significant or ongoing bleeding, you may need further evaluation--to include bloodwork, possibly CT scans.  I encourage you to get your COVID vaccine as soon as possible.  Abdominal Pain, Adult Pain in the abdomen (abdominal pain) can be caused by many things. Often, abdominal pain is not serious and it gets better with no treatment or by being treated at home. However, sometimes abdominal pain is serious. Your health care provider will ask questions about your medical history and do a physical exam to try to determine the cause of your abdominal pain. Follow these instructions at home:  Medicines  Take over-the-counter and prescription medicines only as told by your health care provider.  Do not take a laxative unless told by your health care provider. General instructions  Watch your condition for any changes.  Drink enough fluid to keep your urine pale yellow.  Keep all follow-up visits as told by your health care provider. This is important. Contact a health care provider if:  Your abdominal pain changes or gets worse.  You are not hungry or you lose weight without trying.  You are constipated or have diarrhea for more than 2-3 days.  You have pain when you urinate or have a bowel movement.  Your abdominal pain wakes you up at night.  Your pain gets worse with meals, after eating, or with certain foods.  You are vomiting  and cannot keep anything down.  You have a fever.  You have blood in your urine. Get help right away if:  Your pain does not go away as soon as your health care provider told you to expect.  You cannot stop vomiting.  Your pain is only in areas of the abdomen, such as the right side or the left lower portion of the abdomen. Pain on the right side could be caused by appendicitis.  You have bloody or black stools, or stools that look like tar.  You have severe pain, cramping, or bloating in your abdomen.  You have signs of dehydration, such as: ? Dark urine, very little urine, or no urine. ? Cracked lips. ? Dry mouth. ? Sunken eyes. ? Sleepiness. ? Weakness.  You have trouble breathing or chest pain. Summary  Often, abdominal pain is not serious and it gets better with no treatment or by being treated at home. However, sometimes abdominal pain is serious.  Watch your condition for any changes.  Take over-the-counter and prescription medicines only as told by your health care provider.  Contact a health care provider if your abdominal pain changes or gets worse.  Get help right away if you have severe pain, cramping, or bloating in your abdomen. This information is not intended to replace advice given to you by your health care provider. Make sure you discuss any questions you have with your health care provider. Document Revised: 06/04/2018 Document Reviewed: 06/04/2018 Elsevier Patient Education  2020 [employer].

## 2019-10-04 ENCOUNTER — Other Ambulatory Visit: Payer: Self-pay | Admitting: Cardiovascular Disease

## 2019-10-07 ENCOUNTER — Other Ambulatory Visit: Payer: Self-pay | Admitting: Cardiovascular Disease

## 2019-10-17 ENCOUNTER — Telehealth: Payer: Self-pay | Admitting: Cardiovascular Disease

## 2019-10-17 NOTE — Telephone Encounter (Signed)
Spoke with the patient who states that he is feeling better. His heart rate is still around 110 but symptoms have improved. He did go to an urgent care and they did an EKG which he was told was fine.

## 2019-10-17 NOTE — Telephone Encounter (Signed)
Pt would like Dr. Duke Salvia or nurse to give him a call

## 2019-10-17 NOTE — Telephone Encounter (Signed)
Spoke with the patient who states that last night he woke up around 2am with and increased heart rate and not feeling right. He states that he was able to go back to sleep but woke up this morning with HR still elevated. He states that he is also experiencing fatigue, dizziness, SOB, and nausea. He denies any chest pain. He states heart rate has remained in the 120s. He is not sure if HR is regular. He states that he is on vacation at the beach and reports that he did drink some alcohol last night. He is unsure whether symptoms could be related to alcohol consumption.  Patient confirmed that he has taken his metoprolol today. He also reports that he does not have an appetite and has felt at times like he could pass out. Patient has a history of sinus tachycardia. Advised patient that he should go to the nearest urgent care for evaluation due to associated symptoms.

## 2019-10-17 NOTE — Telephone Encounter (Signed)
STAT if patient feels like he/she is going to faint   1) Are you dizzy now? yes  2) Do you feel faint or have you passed out? yes  3) Do you have any other symptoms? Headaches and tired.   4) Have you checked your HR and BP (record if available)? HR 120-122

## 2019-10-18 ENCOUNTER — Emergency Department (HOSPITAL_COMMUNITY)
Admission: EM | Admit: 2019-10-18 | Discharge: 2019-10-18 | Disposition: A | Discharge status: Home / Self Care | Payer: Commercial Managed Care - PPO | Attending: Emergency Medicine | Admitting: Emergency Medicine

## 2019-10-18 ENCOUNTER — Emergency Department (HOSPITAL_COMMUNITY): Payer: Commercial Managed Care - PPO

## 2019-10-18 ENCOUNTER — Encounter (HOSPITAL_COMMUNITY): Payer: Self-pay | Admitting: *Deleted

## 2019-10-18 ENCOUNTER — Other Ambulatory Visit: Payer: Self-pay

## 2019-10-18 DIAGNOSIS — I110 Hypertensive heart disease with heart failure: Secondary | ICD-10-CM | POA: Insufficient documentation

## 2019-10-18 DIAGNOSIS — Z79899 Other long term (current) drug therapy: Secondary | ICD-10-CM | POA: Insufficient documentation

## 2019-10-18 DIAGNOSIS — R002 Palpitations: Secondary | ICD-10-CM | POA: Diagnosis not present

## 2019-10-18 DIAGNOSIS — R42 Dizziness and giddiness: Secondary | ICD-10-CM | POA: Diagnosis not present

## 2019-10-18 DIAGNOSIS — Z20822 Contact with and (suspected) exposure to covid-19: Secondary | ICD-10-CM | POA: Diagnosis not present

## 2019-10-18 DIAGNOSIS — Z7982 Long term (current) use of aspirin: Secondary | ICD-10-CM | POA: Diagnosis not present

## 2019-10-18 DIAGNOSIS — I509 Heart failure, unspecified: Secondary | ICD-10-CM | POA: Insufficient documentation

## 2019-10-18 DIAGNOSIS — Z8616 Personal history of COVID-19: Secondary | ICD-10-CM | POA: Insufficient documentation

## 2019-10-18 LAB — BASIC METABOLIC PANEL
Anion gap: 11 (ref 5–15)
BUN: 16 mg/dL (ref 6–20)
CO2: 24 mmol/L (ref 22–32)
Calcium: 9.2 mg/dL (ref 8.9–10.3)
Chloride: 103 mmol/L (ref 98–111)
Creatinine, Ser: 1.04 mg/dL (ref 0.61–1.24)
GFR calc Af Amer: >60 mL/min (ref >60)
GFR calc non Af Amer: >60 mL/min (ref >60)
Glucose, Bld: 164 mg/dL — ABNORMAL HIGH (ref 70–99)
Potassium: 3.4 mmol/L — ABNORMAL LOW (ref 3.5–5.1)
Sodium: 138 mmol/L (ref 135–145)

## 2019-10-18 LAB — SARS CORONAVIRUS 2 BY RT PCR (HOSPITAL ORDER, PERFORMED IN ~~LOC~~ HOSPITAL LAB): SARS Coronavirus 2: NEGATIVE

## 2019-10-18 LAB — CBC
HCT: 51.4 % (ref 39.0–52.0)
Hemoglobin: 16.6 g/dL (ref 13.0–17.0)
MCH: 30.4 pg (ref 26.0–34.0)
MCHC: 32.3 g/dL (ref 30.0–36.0)
MCV: 94.1 fL (ref 80.0–100.0)
Platelets: 281 10*3/uL (ref 150–400)
RBC: 5.46 MIL/uL (ref 4.22–5.81)
RDW: 13.5 % (ref 11.5–15.5)
WBC: 7.2 10*3/uL (ref 4.0–10.5)
nRBC: 0 % (ref 0.0–0.2)

## 2019-10-18 LAB — TROPONIN I (HIGH SENSITIVITY)
Troponin I (High Sensitivity): 5 ng/L (ref <18)
Troponin I (High Sensitivity): 5 ng/L (ref <18)

## 2019-10-18 MED ORDER — MECLIZINE HCL 25 MG PO TABS
25.0000 mg | ORAL_TABLET | Freq: Once | ORAL | Status: AC
  Administered 2019-10-18: 25 mg via ORAL
  Filled 2019-10-18: qty 1

## 2019-10-18 MED ORDER — SODIUM CHLORIDE 0.9 % IV BOLUS
1000.0000 mL | Freq: Once | INTRAVENOUS | Status: AC
  Administered 2019-10-18: 1000 mL via INTRAVENOUS

## 2019-10-18 MED ORDER — MECLIZINE HCL 25 MG PO TABS: 25.0000 mg | ORAL_TABLET | Freq: Three times a day (TID) | ORAL | 0 refills | Status: DC | PRN

## 2019-10-18 NOTE — ED Provider Notes (Signed)
[facility] EMERGENCY DEPARTMENT Provider Note   CSN: [id number] Arrival date & time: 10/18/19  5400     History Chief Complaint  Patient presents with  . Chest Pain    Bobby Kelly is a 39 y.o. male.  Pt presents to the ED today with palpitations.  Pt said he first noticed it while at the beach 2 days ago.  He drank heavily the day before and first thought it was due to a hangover.  However, sx worsened.  He went to [facility] and an EKG done there was ok.  He came here today because he still did not feel well.  He has not been vaccinated against Covid.  He did have Covid in October of 2020.        Past Medical History:  Diagnosis Date  . Allergy   . CHF (congestive heart failure) (HCC) 2018  . Cholelithiasis 2018   cholecystectomy with complication, drain   . Dry skin    nasal folds  . Hypertension   . OSA (obstructive sleep apnea)   . Thyroid disease    hyperactive in high school, had oral therapy.    . Wears contact lenses     Patient Active Problem List   Diagnosis Date Noted  . Tachycardia 04/09/2019  . Essential hypertension 04/07/2019  . COVID-19 virus infection 12/10/2018  . SOB (shortness of breath) 12/10/2018  . History of heart failure 12/10/2018  . Elevated LFTs 12/10/2018  . OSA on CPAP 04/04/2018  . Other fatigue 04/04/2018  . Chest pain 04/04/2018  . Lightheaded 04/04/2018  . Dizziness 04/04/2018  . S/P cholecystectomy 11/06/2017  . Impaired fasting blood sugar 11/06/2017  . Cardiomyopathy (HCC)   . Cholelithiasis   . Routine general medical examination at a health care facility 11/03/2016  . Need for influenza vaccination 11/03/2016  . Obesity 11/03/2016  . Vaccine counseling 11/03/2016  . Palpitation 11/03/2016  . Constipation 04/28/2016    Past Surgical History:  Procedure Laterality Date  . APPENDECTOMY    . CHOLECYSTECTOMY N/A 12/03/2016   Procedure: LAPAROSCOPIC CHOLECYSTECTOMY;  Surgeon: Violeta Gelinas, MD;  Location:  [facility] OR;  Service: General;  Laterality: N/A;  . ERCP N/A 12/19/2016   Procedure: ENDOSCOPIC RETROGRADE CHOLANGIOPANCREATOGRAPHY (ERCP);  Surgeon: Vida Rigger, MD;  Location: [facility] ENDOSCOPY;  Service: Endoscopy;  Laterality: N/A;  . ESOPHAGOGASTRODUODENOSCOPY (EGD) WITH PROPOFOL N/A 12/13/2016   Procedure: ESOPHAGOGASTRODUODENOSCOPY (EGD) WITH PROPOFOL;  Surgeon: Willis Modena, MD;  Location: [facility] ENDOSCOPY;  Service: Endoscopy;  Laterality: N/A;  . ESOPHAGOGASTRODUODENOSCOPY (EGD) WITH PROPOFOL N/A 12/15/2016   Procedure: ESOPHAGOGASTRODUODENOSCOPY (EGD) WITH PROPOFOL;  Surgeon: Willis Modena, MD;  Location: [facility] ENDOSCOPY;  Service: Endoscopy;  Laterality: N/A;  . ESOPHAGOGASTRODUODENOSCOPY (EGD) WITH PROPOFOL N/A 04/11/2017   Procedure: ESOPHAGOGASTRODUODENOSCOPY (EGD) WITH PROPOFOL;  Surgeon: Vida Rigger, MD;  Location: [facility] ENDOSCOPY;  Service: Endoscopy;  Laterality: N/A;  side viewing scope- biliary stent removal  . IR CATHETER TUBE CHANGE  12/23/2016  . IR RADIOLOGIST EVAL & MGMT  01/03/2017  . IR RADIOLOGIST EVAL & MGMT  01/17/2017  . IR RADIOLOGIST EVAL & MGMT  01/25/2017  . IR RADIOLOGIST EVAL & MGMT  02/09/2017  . IR RADIOLOGIST EVAL & MGMT  02/21/2017  . IR THORACENTESIS ASP PLEURAL SPACE W/IMG GUIDE  12/23/2016       Family History  Problem Relation Age of Onset  . Hypertension Mother   . Cataracts Mother   . Hyperlipidemia Father   . Pancreatic cancer Maternal Grandmother   .  Cancer Maternal Grandmother   . Diabetes Other   . Heart disease Neg Hx   . Stroke Neg Hx     Social History   Tobacco Use  . Smoking status: Never Smoker  . Smokeless tobacco: Never Used  Vaping Use  . Vaping Use: Never used  Substance Use Topics  . Alcohol use: Yes    Alcohol/week: 4.0 standard drinks    Types: 3 Cans of beer, 1 Shots of liquor per week    Comment: occ, history of 2-3 years of heavier alcohol  . Drug use: No    Home Medications Prior to Admission medications   Medication Sig  Start Date End Date Taking? Authorizing Provider  acetaminophen (TYLENOL) 500 MG tablet Take 500-1,000 mg by mouth every 6 (six) hours as needed for mild pain or headache.   Yes [provider]  aspirin EC 325 MG tablet Take 650 mg by mouth as needed (for headaches).   Yes [provider]  losartan (COZAAR) 25 MG tablet TAKE 1 TABLET(25 MG) BY MOUTH DAILY Patient taking differently: Take 25 mg by mouth daily.  10/07/19  Yes Chilton Si, MD  metoprolol succinate (TOPROL-XL) 25 MG 24 hr tablet TAKE 1 TABLET BY MOUTH DAILY. TAKE WITH OR IMMEDIATELY FOLLOWING A MEAL Patient taking differently: Take 25 mg by mouth daily.  10/04/19  Yes Chilton Si, MD  nitroGLYCERIN (NITROSTAT) 0.4 MG SL tablet Place 1 tablet under tongue every 5 minutes prn for chest pain up to 2 times and if no improvement, go to the emergency room Patient taking differently: Place 0.4 mg under the tongue every 5 (five) minutes x 2 doses as needed for chest pain (AND, GO TO THE E.R., IF NO IMPROVEMENT).  09/25/17  Yes Ronnald Nian, MD  NON FORMULARY Take 1 tablet by mouth See admin instructions. CBD gummies- Chew 1 gummie by mouth as needed to decrease anxiety   Yes [provider]  Propylene Glycol (SYSTANE COMPLETE OP) Place 1-2 drops into both eyes See admin instructions. Place 1-2 drops into each eye prior to putting in contacts daily   Yes [provider]  acetaminophen (TYLENOL) 325 MG tablet Take 325-650 mg by mouth every 6 (six) hours as needed for mild pain, moderate pain or headache.  Patient not taking: Reported on 10/18/2019    [provider]  bismuth subsalicylate (PEPTO BISMOL) 262 MG/15ML suspension Take 30 mLs by mouth every 6 (six) hours as needed. Patient not taking: Reported on 10/18/2019    [provider]  meclizine (ANTIVERT) 25 MG tablet Take 1 tablet (25 mg total) by mouth 3 (three) times daily as needed for dizziness. 10/18/19   Jacalyn Lefevre, MD      Allergies    Patient has no known allergies.  Review of Systems   Review of Systems  Cardiovascular: Positive for palpitations.  All other systems reviewed and are negative.   Physical Exam Updated Vital Signs BP (!) 130/91 (BP Location: Left Arm)   Pulse 89   Temp 98.1 F (36.7 C) (Oral)   Resp 18   Ht 5\' 5"  (1.651 m)   Wt 99.8 kg   SpO2 100%   BMI 36.61 kg/m   Physical Exam Vitals and nursing note reviewed.  Constitutional:      Appearance: He is well-developed.  HENT:     Head: Normocephalic and atraumatic.  Eyes:     Extraocular Movements: Extraocular movements intact.     Pupils: Pupils are equal, round,  and reactive to light.  Cardiovascular:     Rate and Rhythm: Normal rate and regular rhythm.     Heart sounds: Normal heart sounds.  Pulmonary:     Effort: Pulmonary effort is normal.     Breath sounds: Normal breath sounds.  Abdominal:     General: Bowel sounds are normal.     Palpations: Abdomen is soft.  Musculoskeletal:        General: Normal range of motion.     Cervical back: Normal range of motion and neck supple.  Skin:    General: Skin is warm.     Capillary Refill: Capillary refill takes less than 2 seconds.  Neurological:     General: No focal deficit present.     Mental Status: He is alert and oriented to person, place, and time.  Psychiatric:        Mood and Affect: Mood normal.        Behavior: Behavior normal.     ED Results / Procedures / Treatments   Labs (all labs ordered are listed, but only abnormal results are displayed) Labs Reviewed  BASIC METABOLIC PANEL - Abnormal; Notable for the following components:      Result Value   Potassium 3.4 (*)    Glucose, Bld 164 (*)    All other components within normal limits  SARS CORONAVIRUS 2 BY RT PCR (HOSPITAL ORDER, PERFORMED IN [facility])  CBC  TROPONIN I (HIGH SENSITIVITY)  TROPONIN I (HIGH SENSITIVITY)    EKG EKG Interpretation  Date/Time:  Friday  October 18 2019 22:54:02 EDT Ventricular Rate:  77 PR Interval:  188 QRS Duration: 88 QT Interval:  424 QTC Calculation: 480 R Axis:   77 Text Interpretation: Sinus rhythm Anteroseptal infarct, old Since last tracing rate slower Confirmed by Jacalyn Lefevre [id number]) on 10/18/2019 10:58:57 PM   Radiology DG Chest 2 View  Result Date: 10/18/2019 CLINICAL DATA:  Chest pain EXAM: CHEST - 2 VIEW COMPARISON:  09/26/2017 FINDINGS: Stable mild elevation the right hemidiaphragm. Unchanged mild interstitial prominence. No new consolidation or edema. No pleural effusion or pneumothorax. No acute osseous abnormality. IMPRESSION: No acute process in the chest. Electronically Signed   By: Guadlupe Spanish M.D.   On: 10/18/2019 10:58    Procedures Procedures (including critical care time)  Medications Ordered in ED Medications  meclizine (ANTIVERT) tablet 25 mg (has no administration in time range)  sodium chloride 0.9 % bolus 1,000 mL (0 mLs Intravenous Stopped 10/18/19 2115)    ED Course  I have reviewed the triage vital signs and the nursing notes.  Pertinent labs & imaging results that were available during my care of the patient were reviewed by me and considered in my medical decision making (see chart for details).    MDM Rules/Calculators/A&P                         Cardiac work up negative.  BS slightly elevated at 164.  Pt told to eat a healthier diet and exercise.  Pt was observed for several hours and HR has been normal since his initial EKG done upon arrival.  Pt did have an episode of dizziness while here.  Monitor nl.  Vitals nl.  Pt is ambulatory without problems.  Repeat EKG ok.    Pt is stable for d/c.  F/u with pcp.  Covid neg.    Final Clinical Impression(s) / ED Diagnoses Final diagnoses:  Palpitations  Vertigo  Rx / DC Orders ED Discharge Orders         Ordered    meclizine (ANTIVERT) 25 MG tablet  3 times daily PRN        10/18/19 2310             Jacalyn Lefevre, MD 10/18/19 2312

## 2019-10-18 NOTE — ED Notes (Signed)
Discharge instructions discussed with pt. Pt verbalized understanding. Pt stable and ambulatory. No signature pad available. 

## 2019-10-18 NOTE — ED Triage Notes (Signed)
To ED for eval of cp and palpitations. States he noticed the palpitations while at the beach a few days ago. Went to an [facility] there and told his ECG looked ok but to see an ED due to hx. Pt states he went to the ED and felt better after about 2 hrs so left. Hx of CHF

## 2019-10-22 MED ORDER — METOPROLOL SUCCINATE ER 50 MG PO TB24: 50.0000 mg | ORAL_TABLET | Freq: Every day | ORAL | 1 refills | Status: DC

## 2019-10-22 NOTE — Telephone Encounter (Signed)
Left message for patient to call back  

## 2019-10-22 NOTE — Telephone Encounter (Signed)
Increase metoprolol to 50 mg.

## 2019-10-22 NOTE — Telephone Encounter (Signed)
Follow Up:     Pt is returning your call. 

## 2019-10-22 NOTE — Telephone Encounter (Signed)
Called and made patient aware that Dr. Duke Salvia would like for him to increase his Toprol-XL to 50 mg QD. Patient verbalized understanding. Rx updated at preferred pharmacy.

## 2019-10-22 NOTE — Telephone Encounter (Signed)
Patient is returning call.  °

## 2019-10-23 NOTE — Telephone Encounter (Addendum)
Patient went to ED yesterday and per patient they felt he was dehydrated, was at beach last week Was given IV fluids  Blood pressure 122/84 this am HR 82 with Metoprolol 25 mg daily Patient will not increase Metoprolol at this time. He will continue to monitor BP/HR Will forward to Dr Duke Salvia for review

## 2019-10-23 NOTE — Telephone Encounter (Signed)
OK thanks.  Sounds good.  That's why its better to see patients in person!

## 2019-10-28 ENCOUNTER — Telehealth: Payer: Self-pay | Admitting: Cardiovascular Disease

## 2019-10-28 NOTE — Telephone Encounter (Signed)
Returned call to patient of Dr. Duke Salvia who reports feels very tired, cannot do much physical activity without feeling drained. This has been going on for a few days. In regards to poor circulation, he feels like his heart pumping enough - reports headaches, dizziness.   Patient is taking losartan 25mg , metoprolol succinate 25mg  daily.  9/19 - BP 122/78,  HR 82  Advised that patient bring log of BP/HR readings to 10/30/19 visit No acute concerns noted at this time Will send to MD as 10/19

## 2019-10-28 NOTE — Telephone Encounter (Signed)
Pt sent a message per pt schedule stating "Feeling of tiredness and poor circulation. Not feeling well."  An appointment has been scheduled due to this for 10/30/19 at 3:20 AM. Please advise.

## 2019-10-29 NOTE — Telephone Encounter (Signed)
These are normal vital signs.  Happy to discuss in clinic or he can talk with his PCP if he thinks something else may be going on.

## 2019-10-30 ENCOUNTER — Ambulatory Visit (INDEPENDENT_AMBULATORY_CARE_PROVIDER_SITE_OTHER): Payer: Commercial Managed Care - PPO | Admitting: Cardiovascular Disease

## 2019-10-30 ENCOUNTER — Other Ambulatory Visit: Payer: Self-pay

## 2019-10-30 ENCOUNTER — Encounter: Payer: Self-pay | Admitting: Cardiovascular Disease

## 2019-10-30 VITALS — BP 122/80 | HR 87 | Ht 65.0 in | Wt 225.0 lb

## 2019-10-30 DIAGNOSIS — R0602 Shortness of breath: Secondary | ICD-10-CM | POA: Diagnosis not present

## 2019-10-30 DIAGNOSIS — I10 Essential (primary) hypertension: Secondary | ICD-10-CM

## 2019-10-30 DIAGNOSIS — I428 Other cardiomyopathies: Secondary | ICD-10-CM | POA: Diagnosis not present

## 2019-10-30 DIAGNOSIS — R002 Palpitations: Secondary | ICD-10-CM | POA: Diagnosis not present

## 2019-10-30 DIAGNOSIS — R000 Tachycardia, unspecified: Secondary | ICD-10-CM

## 2019-10-30 DIAGNOSIS — G4733 Obstructive sleep apnea (adult) (pediatric): Secondary | ICD-10-CM

## 2019-10-30 DIAGNOSIS — Z9989 Dependence on other enabling machines and devices: Secondary | ICD-10-CM

## 2019-10-30 MED ORDER — METOPROLOL SUCCINATE ER 50 MG PO TB24
50.0000 mg | ORAL_TABLET | Freq: Every day | ORAL | 3 refills | Status: DC
Start: 2019-10-30 — End: 2021-01-18

## 2019-10-30 NOTE — Patient Instructions (Addendum)
Medication Instructions:  INCREASE YOUR METOPROLOL TO 50 MG DAILY   *If you need a refill on your cardiac medications before your next appointment, please call your pharmacy*   Lab Work: CBC/CMET/D-DIMER/TSH/BNP TOMORROW  If you have labs (blood work) drawn today and your tests are completely normal, you will receive your results only by: Marland Kitchen MyChart Message (if you have MyChart) OR . A paper copy in the mail If you have any lab test that is abnormal or we need to change your treatment, we will call you to review the results.  Testing/Procedures: Your physician has requested that you have an echocardiogram. Echocardiography is a painless test that uses sound waves to create images of your heart. It provides your doctor with information about the size and shape of your heart and how well your heart's chambers and valves are working. This procedure takes approximately one hour. There are no restrictions for this procedure. [facility] AT [address]   Follow-Up: At [facility], you and your health needs are our priority.  As part of our continuing mission to provide you with exceptional heart care, we have created designated Provider Care Teams.  These Care Teams include your primary Cardiologist (physician) and Advanced Practice Providers (APPs -  Physician Assistants and Nurse Practitioners) who all work together to provide you with the care you need, when you need it.  We recommend signing up for the patient portal called "MyChart".  Sign up information is provided on this After Visit Summary.  MyChart is used to connect with patients for Virtual Visits (Telemedicine).  Patients are able to view lab/test results, encounter notes, upcoming appointments, etc.  Non-urgent messages can be sent to your provider as well.   To learn more about what you can do with MyChart, go to [email or web address].    Your next appointment:   4-6 WEEKS   The format for your next  appointment:   In Person  Provider:   You may see Chilton Si, MD or one of the following Advanced Practice Providers on your designated Care Team:    Corine Shelter, PA-C  [place], [place]  Edd Fabian, [place]

## 2019-10-30 NOTE — Telephone Encounter (Signed)
Patient has appointment with Dr Duke Salvia today

## 2019-10-30 NOTE — Progress Notes (Signed)
Cardiology Office Note   Date:  10/30/2019   ID:  Bobby Kelly, DOB 20-Jan-1981, MRN [id number]  PCP:  Jac Canavan, PA-C  Cardiologist:   Chilton Si, MD   No chief complaint on file.    History of Present Illness: Bobby Kelly is a 39 y.o. male with chronic systolic and diastolic heart failure who is being seen today for the evaluation of heart failure and chest pain at the request of Jac Canavan, PA-C.  Mr. Bobby Kelly first saw [name] 08/2016 when he was referred for tachycardia, chest pain, and shortness of breath.  He was noted to have episodes of tachycardia to the 100s on his Fitbit.  His heart rate increased to the 130s with minimal exertion.  He had recently been started on antihypertensives by his primary care provider.  [name] noted that he had episodes of sinus tachycardia.  He had him wear a 48-hour Holter that did not reveal any other significant arrhythmias.  He had an exercise Myoview 09/12/2016 that revealed LVEF 44% with a concern for inferior infarct with peri-infarct ischemia versus diaphragmatic attenuation.  Mr. Bobby Kelly was seen in the ED 09/26/2017 with dyspnea.  He became short of breath and dizzy when driving home.  Cardiac enzymes were negative and his EKG was he was then referred without ischemic changes.  Prior to that he saw Dr. [name] on 09/25/17 with chest discomfort and palpitations.  He had an echocardiogram 09/19/2016 that revealed LVEF 35 to 40% with grade 2 diastolic dysfunction.  Hydrochlorothiazide and amlodipine were switched to losartan and metoprolol.  He had a repeat echocardiogram 04/2017 that revealed improvement in his LVEF to 50 to 55%.  He followed up with [name] 09/27/2017 and losartan was reduced due to low blood pressures.    At his last appointment Mr. Bobby Kelly was referred for sleep study and was found to have OSA.  He has an upcoming CPAP titration study.  Mr. Bobby Kelly continues to have intermittent chest pain.  Two weeks ago he noted  chest pain when sitting down.  It lasted consistently for a few days.   He hadn't been doing anything strenous and it felt lke a pinching sensation. It was worse when sneezing or moving quickly.  There was no associated shortness of breath, nausea or diaphoresis.  He notices some shortness of breath when sleeping or when he first wakes up.  He doesn't feel rested in the morning.  He isn't getting any exercise outside of work.  Mr. Bobby Kelly denies lower extremity edema, orthopnea or PND.   Lately he notes that he hasn't been feeling well.  He feels like his heart can't keep up .  He gets tired and short of breath when he gets up in the mornings.  He has episodes of dizziness and presyncope.  He was seen in the ED 9/10 with palpitations and dizziness.  He had an episode of dizziness while on telemetry and vitals were unremarkable at the time.  He has occasional sharp chest pain that is random and not with exertion.  Lately he has not been getting much exercise lately.  He called the office and was recommended that he increase metoprolol to 50 mg but he never did so.  He brings a log of his blood pressure and heart rates showing that his blood pressure has been mostly in the 1 teens to 130s.  Heart rates have typically been under 100 but he notes that occasionally they do go over  100 bpm.  He is unsure whether something truly wrong with his heart or he is feeling anxious.  He reports erectile dysfunction and his hands have been cold lately.  He did take a couple trips to the beach where he was in the car for for 5 hours.  He denies any calf pain.  He uses his CPAP regularly but does report some orthopnea and PND.  He has not noted any lower extremity edema.   Past Medical History:  Diagnosis Date  . Allergy   . CHF (congestive heart failure) (HCC) 2018  . Cholelithiasis 2018   cholecystectomy with complication, drain   . Dry skin    nasal folds  . Hypertension   . OSA (obstructive sleep apnea)   . Thyroid  disease    hyperactive in high school, had oral therapy.    . Wears contact lenses     Past Surgical History:  Procedure Laterality Date  . APPENDECTOMY    . CHOLECYSTECTOMY N/A 12/03/2016   Procedure: LAPAROSCOPIC CHOLECYSTECTOMY;  Surgeon: Violeta Gelinas, MD;  Location: [facility] OR;  Service: General;  Laterality: N/A;  . ERCP N/A 12/19/2016   Procedure: ENDOSCOPIC RETROGRADE CHOLANGIOPANCREATOGRAPHY (ERCP);  Surgeon: Vida Rigger, MD;  Location: [facility] ENDOSCOPY;  Service: Endoscopy;  Laterality: N/A;  . ESOPHAGOGASTRODUODENOSCOPY (EGD) WITH PROPOFOL N/A 12/13/2016   Procedure: ESOPHAGOGASTRODUODENOSCOPY (EGD) WITH PROPOFOL;  Surgeon: Willis Modena, MD;  Location[facility] ENDOSCOPY;  Service: Endoscopy;  Laterality: N/A;  . ESOPHAGOGASTRODUODENOSCOPY (EGD) WITH PROPOFOL N/A 12/15/2016   Procedure: ESOPHAGOGASTRODUODENOSCOPY (EGD) WITH PROPOFOL;  Surgeon: Willis Modena, MD;  Location: [facility] ENDOSCOPY;  Service: Endoscopy;  Laterality: N/A;  . ESOPHAGOGASTRODUODENOSCOPY (EGD) WITH PROPOFOL N/A 04/11/2017   Procedure: ESOPHAGOGASTRODUODENOSCOPY (EGD) WITH PROPOFOL;  Surgeon: Vida Rigger, MD;  Location: [facility] ENDOSCOPY;  Service: Endoscopy;  Laterality: N/A;  side viewing scope- biliary stent removal  . IR CATHETER TUBE CHANGE  12/23/2016  . IR RADIOLOGIST EVAL & MGMT  01/03/2017  . IR RADIOLOGIST EVAL & MGMT  01/17/2017  . IR RADIOLOGIST EVAL & MGMT  01/25/2017  . IR RADIOLOGIST EVAL & MGMT  02/09/2017  . IR RADIOLOGIST EVAL & MGMT  02/21/2017  . IR THORACENTESIS ASP PLEURAL SPACE W/IMG GUIDE  12/23/2016     Current Outpatient Medications  Medication Sig Dispense Refill  . acetaminophen (TYLENOL) 325 MG tablet Take 325-650 mg by mouth every 6 (six) hours as needed for mild pain, moderate pain or headache.     Marland Kitchen acetaminophen (TYLENOL) 500 MG tablet Take 500-1,000 mg by mouth every 6 (six) hours as needed for mild pain or headache.    Marland Kitchen aspirin EC 325 MG tablet Take 650 mg by mouth as needed (for headaches).     . bismuth subsalicylate (PEPTO BISMOL) 262 MG/15ML suspension Take 30 mLs by mouth every 6 (six) hours as needed.     Marland Kitchen losartan (COZAAR) 25 MG tablet TAKE 1 TABLET(25 MG) BY MOUTH DAILY (Patient taking differently: Take 25 mg by mouth daily. ) 90 tablet 2  . meclizine (ANTIVERT) 25 MG tablet Take 1 tablet (25 mg total) by mouth 3 (three) times daily as needed for dizziness. 30 tablet 0  . metoprolol succinate (TOPROL-XL) 50 MG 24 hr tablet Take 1 tablet (50 mg total) by mouth daily. 90 tablet 3  . nitroGLYCERIN (NITROSTAT) 0.4 MG SL tablet Place 1 tablet under tongue every 5 minutes prn for chest pain up to 2 times and if no improvement, go to the emergency room (Patient taking differently: Place 0.4  mg under the tongue every 5 (five) minutes x 2 doses as needed for chest pain (AND, GO TO THE E.R., IF NO IMPROVEMENT). ) 50 tablet 3  . NON FORMULARY Take 1 tablet by mouth See admin instructions. CBD gummies- Chew 1 gummie by mouth as needed to decrease anxiety    . Propylene Glycol (SYSTANE COMPLETE OP) Place 1-2 drops into both eyes See admin instructions. Place 1-2 drops into each eye prior to putting in contacts daily     No current facility-administered medications for this visit.    Allergies:   Patient has no known allergies.    Social History:  The patient  reports that he has never smoked. He has never used smokeless tobacco. He reports current alcohol use of about 4.0 standard drinks of alcohol per week. He reports that he does not use drugs.   Family History:  The patient's family history includes Cancer in his maternal grandmother; Cataracts in his mother; Diabetes in an other family member; Hyperlipidemia in his father; Hypertension in his mother; Pancreatic cancer in his maternal grandmother.    ROS:  Please see the history of present illness.   Otherwise, review of systems are positive for none.   All other systems are reviewed and negative.    PHYSICAL EXAM: VS:  BP 122/80    Pulse 87   Ht 5\' 5"  (1.651 m)   Wt 225 lb (102.1 kg)   SpO2 98%   BMI 37.44 kg/m  , BMI Body mass index is 37.44 kg/m. GENERAL:  Well appearing HEENT:  Pupils equal round and reactive, fundi not visualized, oral mucosa unremarkable NECK:  No jugular venous distention, waveform within normal limits, carotid upstroke brisk and symmetric, no bruits LUNGS:  Clear to auscultation bilaterally HEART:  RRR.  PMI not displaced or sustained,S1 and S2 within normal limits, no S3, no S4, no clicks, no rubs, no murmurs ABD:  Flat, positive bowel sounds normal in frequency in pitch, no bruits, no rebound, no guarding, no midline pulsatile mass, no hepatomegaly, no splenomegaly EXT:  2 plus pulses throughout, no edema, no cyanosis no clubbing SKIN:  No rashes no nodules NEURO:  Cranial nerves II through XII grossly intact, motor grossly intact throughout PSYCH:  Cognitively intact, oriented to person place and time   EKG:  EKG is not ordered today. The ekg ordered today demonstrates sinus rhythm.  Rate 89 bpm.    Lexiscan Myoview 09/2016:  Nuclear stress EF: 44%.  No significant change in nonspecific ST abnormality during infusion.  There is a small defect of mild severity present in the basal inferior and mid inferior location. The defect is partially reversible and consistent with infarct with peri infarct ischemia vs. Variations in diaphragmatic attenuation artifact.  The left ventricular ejection fraction is moderately decreased (30-44%).  This is an intermediate risk study.  Recommend 2D echo for further assessment of LVF and inferior wall motion.   Echo 09/19/16: Study Conclusions  - Left ventricle: The cavity size was normal. Wall thickness was   normal. Systolic function was moderately reduced. The estimated   ejection fraction was in the range of 35% to 40%. Features are   consistent with a pseudonormal left ventricular filling pattern,   with concomitant abnormal relaxation and  increased filling   pressure (grade 2 diastolic dysfunction). - Right atrium: The atrium was mildly dilated.  Echo 04/10/17: Study Conclusions  - Left ventricle: The cavity size was normal. Wall thickness was   normal. Systolic function  was normal. The estimated ejection   fraction was in the range of 50% to 55%. Wall motion was normal;   there were no regional wall motion abnormalities. The study is   not technically sufficient to allow evaluation of LV diastolic   function. - Mitral valve: Calcified annulus. Mildly thickened leaflets .   Recent Labs: 12/11/2018: TSH 1.290 10/18/2019: BUN 16; Creatinine, Ser 1.04; Hemoglobin 16.6; Platelets 281; Potassium 3.4; Sodium 138    Lipid Panel    Component Value Date/Time   CHOL 175 11/06/2017 0908   TRIG 104 11/06/2017 0908   HDL 59 11/06/2017 0908   CHOLHDL 3.0 11/06/2017 0908   CHOLHDL 3.3 11/03/2016 0811   LDLCALC 95 11/06/2017 0908   LDLCALC 94 11/03/2016 0811      Wt Readings from Last 3 Encounters:  10/30/19 225 lb (102.1 kg)  10/18/19 220 lb (99.8 kg)  09/19/19 230 lb 12.8 oz (104.7 kg)      ASSESSMENT AND PLAN:  # Atypical chest pain: Symptoms are atypical.  No coronary calcification was noted on his chest CT 09/2017.  Therefore, it is unlikely that his symptoms are due to ischemia.  He had some mild chest wall tenderness to palpation.  No plans for any additional ischemia evaluation at this time.  # Shortness of breath:  # Palpitations:  It is unclear what is causing his symptoms.  He appears to be euvolemic on exam.  However he does have a history of systolic and diastolic heart failure.  We will repeat his echocardiogram.  Heart rates have been generally well-controlled.  He did not have any tachycardia when he was feeling dizzy in the hospital and on telemetry.  Therefore we will not repeat an ambulatory monitor at this time.  Increase metoprolol to 50 mg daily.  Check a D-dimer given his history of travel, though  he does not have any lower extremity edema or calf pain.  We will also check a CBC, CMP, and TSH.  # Hypertension: Blood pressure stable on losartan.  Increasing metoprolol as above.  # Obestiy:  Encouraged to increase exercise to at least 150 minutes per week.   # OSA: Mr. Cordia was encouraged to keep up with his CPAP titration.  # ED: We will address once the above acute issues are resolved.  Increasing metoprolol for now.  He may benefit from switching to nebivolol in the future.  # Chronic systolic and diastolic heart failure:  LVEF improved to 50-55% on most recent assessment.  It is possible it was tachycardia induced.  Checking echo as above.  Current medicines are reviewed at length with the patient today.  The patient does not have concerns regarding medicines.  The following changes have been made:  no change  Labs/ tests ordered today include:   Orders Placed This Encounter  Procedures  . CBC with Differential/Platelet  . TSH  . Comprehensive metabolic panel  . B Nat Peptide  . D-Dimer, Quantitative  . ECHOCARDIOGRAM COMPLETE     Disposition:   FU with Harlei Lehrmann C. Duke Salvia, MD, [facility] in  4-6 weeks.   Signed, Erla Bacchi C. Duke Salvia, MD, [facility]  10/30/2019 5:17 PM    [facility]

## 2019-11-01 LAB — CBC WITH DIFFERENTIAL/PLATELET
Basophils Absolute: 0.1 10*3/uL (ref 0.0–0.2)
Basos: 1 %
EOS (ABSOLUTE): 0.1 10*3/uL (ref 0.0–0.4)
Eos: 1 %
Hematocrit: 49.7 % (ref 37.5–51.0)
Hemoglobin: 16.9 g/dL (ref 13.0–17.7)
Immature Grans (Abs): 0 10*3/uL (ref 0.0–0.1)
Immature Granulocytes: 0 %
Lymphocytes Absolute: 2.3 10*3/uL (ref 0.7–3.1)
Lymphs: 25 %
MCH: 31.2 pg (ref 26.6–33.0)
MCHC: 34 g/dL (ref 31.5–35.7)
MCV: 92 fL (ref 79–97)
Monocytes Absolute: 0.8 10*3/uL (ref 0.1–0.9)
Monocytes: 8 %
Neutrophils Absolute: 5.9 10*3/uL (ref 1.4–7.0)
Neutrophils: 65 %
Platelets: 304 10*3/uL (ref 150–450)
RBC: 5.42 x10E6/uL (ref 4.14–5.80)
RDW: 13.1 % (ref 11.6–15.4)
WBC: 9.1 10*3/uL (ref 3.4–10.8)

## 2019-11-01 LAB — COMPREHENSIVE METABOLIC PANEL
ALT: 78 IU/L — ABNORMAL HIGH (ref 0–44)
AST: 47 IU/L — ABNORMAL HIGH (ref 0–40)
Albumin/Globulin Ratio: 1.4 (ref 1.2–2.2)
Albumin: 4.6 g/dL (ref 4.0–5.0)
Alkaline Phosphatase: 96 IU/L (ref 44–121)
BUN/Creatinine Ratio: 12 (ref 9–20)
BUN: 13 mg/dL (ref 6–20)
Bilirubin Total: 0.3 mg/dL (ref 0.0–1.2)
CO2: 23 mmol/L (ref 20–29)
Calcium: 9.9 mg/dL (ref 8.7–10.2)
Chloride: 102 mmol/L (ref 96–106)
Creatinine, Ser: 1.09 mg/dL (ref 0.76–1.27)
GFR calc Af Amer: 100 mL/min/{1.73_m2} (ref >59)
GFR calc non Af Amer: 86 mL/min/{1.73_m2} (ref >59)
Globulin, Total: 3.2 g/dL (ref 1.5–4.5)
Glucose: 89 mg/dL (ref 65–99)
Potassium: 4.4 mmol/L (ref 3.5–5.2)
Sodium: 139 mmol/L (ref 134–144)
Total Protein: 7.8 g/dL (ref 6.0–8.5)

## 2019-11-01 LAB — BRAIN NATRIURETIC PEPTIDE: BNP: 4.1 pg/mL (ref 0.0–100.0)

## 2019-11-01 LAB — D-DIMER, QUANTITATIVE: D-DIMER: 0.25 mg/L FEU (ref 0.00–0.49)

## 2019-11-01 LAB — TSH: TSH: 1.67 u[IU]/mL (ref 0.450–4.500)

## 2019-11-04 ENCOUNTER — Ambulatory Visit (HOSPITAL_COMMUNITY): Payer: Commercial Managed Care - PPO | Attending: Internal Medicine

## 2019-11-04 ENCOUNTER — Other Ambulatory Visit: Payer: Self-pay

## 2019-11-04 DIAGNOSIS — R0602 Shortness of breath: Secondary | ICD-10-CM | POA: Diagnosis present

## 2019-11-04 DIAGNOSIS — I10 Essential (primary) hypertension: Secondary | ICD-10-CM | POA: Diagnosis not present

## 2019-11-04 DIAGNOSIS — R002 Palpitations: Secondary | ICD-10-CM | POA: Diagnosis present

## 2019-11-04 DIAGNOSIS — I428 Other cardiomyopathies: Secondary | ICD-10-CM | POA: Diagnosis not present

## 2019-11-04 LAB — ECHOCARDIOGRAM COMPLETE
Area-P 1/2: 4.89 cm2
S' Lateral: 3.1 cm

## 2019-11-04 MED ORDER — PERFLUTREN LIPID MICROSPHERE
1.0000 mL | INTRAVENOUS | Status: AC | PRN
  Administered 2019-11-04: 1 mL via INTRAVENOUS

## 2019-11-20 ENCOUNTER — Ambulatory Visit (INDEPENDENT_AMBULATORY_CARE_PROVIDER_SITE_OTHER): Payer: Commercial Managed Care - PPO | Admitting: Medical

## 2019-11-20 ENCOUNTER — Encounter: Payer: Self-pay | Admitting: Medical

## 2019-11-20 ENCOUNTER — Other Ambulatory Visit: Payer: Self-pay

## 2019-11-20 VITALS — BP 130/78 | HR 92 | Ht 65.0 in | Wt 225.4 lb

## 2019-11-20 DIAGNOSIS — R7989 Other specified abnormal findings of blood chemistry: Secondary | ICD-10-CM | POA: Diagnosis not present

## 2019-11-20 DIAGNOSIS — R3589 Other polyuria: Secondary | ICD-10-CM

## 2019-11-20 DIAGNOSIS — R140 Abdominal distension (gaseous): Secondary | ICD-10-CM

## 2019-11-20 DIAGNOSIS — R109 Unspecified abdominal pain: Secondary | ICD-10-CM | POA: Diagnosis not present

## 2019-11-20 DIAGNOSIS — Z131 Encounter for screening for diabetes mellitus: Secondary | ICD-10-CM | POA: Diagnosis not present

## 2019-11-20 DIAGNOSIS — K5900 Constipation, unspecified: Secondary | ICD-10-CM

## 2019-11-20 DIAGNOSIS — Z6837 Body mass index (BMI) 37.0-37.9, adult: Secondary | ICD-10-CM

## 2019-11-20 LAB — POCT URINALYSIS DIP (CLINITEK)
Bilirubin, UA: NEGATIVE
Blood, UA: NEGATIVE
Glucose, UA: NEGATIVE mg/dL
Ketones, POC UA: NEGATIVE mg/dL
Leukocytes, UA: NEGATIVE
Nitrite, UA: NEGATIVE
POC PROTEIN,UA: NEGATIVE
Spec Grav, UA: 1.015 (ref 1.010–1.025)
Urobilinogen, UA: 0.2 E.U./dL
pH, UA: 6.5 (ref 5.0–8.0)

## 2019-11-20 LAB — POCT GLYCOSYLATED HEMOGLOBIN (HGB A1C): Hemoglobin A1C: 5.5 % (ref 4.0–5.6)

## 2019-11-20 MED ORDER — FAMOTIDINE 20 MG PO TABS: 20.0000 mg | ORAL_TABLET | Freq: Every day | ORAL | 0 refills | Status: DC

## 2019-11-20 MED ORDER — POLYETHYLENE GLYCOL 3350 17 GM/SCOOP PO POWD: 17.0000 g | Freq: Every day | ORAL | 0 refills | Status: DC

## 2019-11-20 NOTE — Patient Instructions (Signed)
Encounter Diagnoses  Name Primary?  . Abdominal pain, unspecified abdominal location Yes  . Elevated LFTs   . Constipation, unspecified constipation type   . Abdominal bloating   . BMI 37.0-37.9, adult   . Screening for diabetes mellitus   . Polyuria     Recommendations  Begin Pepcid tablet once daily preferably 30 minutes before meal  Begin MiraLAX powder 1 or 2 times daily  Drink plenty of water at least 80 ounces per day  Stop going to the [place] buffet  Try to limit your portion size from meat and cheese  We will set you up for an ultrasound of the abdomen  Lets plan to recheck your liver test in 1 to 2 months  If worse in the meantime let me know  Your diabetes screen is normal today and your urine looked okay today  Intermittent fasting (IF) is an eating pattern that cycles between periods of fasting and eating. It's currently very popular in the health and fitness community.  Intermittent Fasting Methods There are several different ways of doing intermittent fasting -- all of which involve splitting the day or week into eating and fasting periods.  During the fasting periods, you eat either very little or nothing at all.  These are the most popular methods:  The 16/8 method involves skipping breakfast and restricting your daily eating period to 8 hours, such as 1-9 p.m. Then you fast for 16 hours in between.  Eat-Stop-Eat: This involves fasting for 24 hours, once or twice a week, for example by not eating from dinner one day until dinner the next day.  The 5:2 diet: With this method, you consume only 500-600 calories on two nonconsecutive days of the week, but eat normally the other 5 days.  By reducing your calorie intake, all of these methods should cause weight loss as long as you don't compensate by eating much more during the eating periods.  Many people find the 16/8 method to be the simplest, most sustainable and easiest to stick to. It's also the most  popular.

## 2019-11-20 NOTE — Progress Notes (Signed)
Established patient visit   Patient: Bobby Kelly   DOB: 1980-06-07   39 y.o. Male  MRN: [id number] Visit Date: 11/20/2019  Today's healthcare provider: Kristian Covey, PA-C   Chief Complaint  Patient presents with  . Abdominal Pain    x1-2 months   . Back Pain    x1 week   I,Porsha McClurkin,acting as a scribe for [employer], PA-C.,have documented all relevant documentation on his behalf,as directed and in the presence of [employer].  Subjective    Abdominal Pain This is a recurrent problem. The current episode started 1 to 4 weeks ago. The onset quality is sudden. The problem occurs daily. The problem has been unchanged. The pain is located in the LLQ and RLQ. The quality of the pain is sharp. The abdominal pain radiates to the back. Associated symptoms include diarrhea and nausea. Pertinent negatives include no belching, constipation or vomiting.   HPI    Abdominal Pain     Additional comments: x1-2 months           Back Pain     Additional comments: x1 week        Last edited by Victorio Palm, CMA on 11/20/2019  1:55 PM. (History)     He reports once or twice daily bowel movements but less stool output and a lot of gas. Denies blood in stool. Patient reports he have episodes to where he feels dizzy.   Patient reports he went to the cardiologist last week due to discomfort in chest. He states that they did EKG's, Echocardiogram, chest x-ray.  No cardiac cause found  Patient reports taking Tums and alka-seltzer for the pain.  Patient reports that he has urinated more than usual but denies any blood in the urine.  Patient was advised to try the intermittent fasting.  No other new complaints  Medications: Outpatient Medications Prior to Visit  Medication Sig  . losartan (COZAAR) 25 MG tablet TAKE 1 TABLET(25 MG) BY MOUTH DAILY (Patient taking differently: Take 25 mg by mouth daily. )  . meclizine (ANTIVERT) 25 MG tablet Take 1 tablet (25 mg  total) by mouth 3 (three) times daily as needed for dizziness.  . metoprolol succinate (TOPROL-XL) 50 MG 24 hr tablet Take 1 tablet (50 mg total) by mouth daily.  Marland Kitchen Propylene Glycol (SYSTANE COMPLETE OP) Place 1-2 drops into both eyes See admin instructions. Place 1-2 drops into each eye prior to putting in contacts daily  . acetaminophen (TYLENOL) 325 MG tablet Take 325-650 mg by mouth every 6 (six) hours as needed for mild pain, moderate pain or headache.  (Patient not taking: Reported on 11/20/2019)  . acetaminophen (TYLENOL) 500 MG tablet Take 500-1,000 mg by mouth every 6 (six) hours as needed for mild pain or headache. (Patient not taking: Reported on 11/20/2019)  . aspirin EC 325 MG tablet Take 650 mg by mouth as needed (for headaches). (Patient not taking: Reported on 11/20/2019)  . bismuth subsalicylate (PEPTO BISMOL) 262 MG/15ML suspension Take 30 mLs by mouth every 6 (six) hours as needed.  (Patient not taking: Reported on 11/20/2019)  . nitroGLYCERIN (NITROSTAT) 0.4 MG SL tablet Place 1 tablet under tongue every 5 minutes prn for chest pain up to 2 times and if no improvement, go to the emergency room (Patient not taking: Reported on 11/20/2019)  . NON FORMULARY Take 1 tablet by mouth See admin instructions. CBD gummies- Chew 1 gummie by mouth as needed to decrease anxiety (Patient not taking:  Reported on 11/20/2019)   No facility-administered medications prior to visit.    Review of Systems  Gastrointestinal: Positive for abdominal pain, diarrhea and nausea. Negative for constipation and vomiting.       Objective    BP 130/78   Pulse 92   Ht 5\' 5"  (1.651 m)   Wt 225 lb 6.4 oz (102.2 kg)   SpO2 98%   BMI 37.51 kg/m    Physical Exam Constitutional:      Appearance: Normal appearance. He is obese.  Neck:     Vascular: No carotid bruit.  Cardiovascular:     Rate and Rhythm: Normal rate and regular rhythm.     Heart sounds: No murmur heard.   Pulmonary:     Effort:  Pulmonary effort is normal.     Breath sounds: Normal breath sounds. No wheezing, rhonchi or rales.  Abdominal:     General: Abdomen is flat. Bowel sounds are normal. There is no distension or abdominal bruit. There are no signs of injury.     Palpations: Abdomen is soft.     Comments: Tender epigastric increased bowel sounds  Musculoskeletal:     Cervical back: Normal range of motion and neck supple. No tenderness.     Thoracic back: Tenderness present.     Lumbar back: Tenderness present.  Lymphadenopathy:     Cervical: No cervical adenopathy.  Skin:    General: Skin is warm and dry.  Neurological:     General: No focal deficit present.     Mental Status: He is alert and oriented to person, place, and time.  Psychiatric:        Mood and Affect: Mood normal.        Behavior: Behavior normal.         Assessment & Plan   Encounter Diagnoses  Name Primary?  . Abdominal pain, unspecified abdominal location Yes  . Elevated LFTs   . Constipation, unspecified constipation type   . Abdominal bloating   . BMI 37.0-37.9, adult   . Screening for diabetes mellitus   . Polyuria        I reviewed his cardiology office visit notes from September 22.  At that time they felt his symptoms were atypical of chest pain.  They do not feel he needed any other cardiac work-up at that time after reviewing records.  We discussed his symptoms and concerns.  He is status post cholecystectomy.  His symptoms are not really suggestive of bloating and constipation.  Begin samples of MiraLAX and Pepcid.  Discussed proper use of medication.  Discussed water intake, advise he try intermittent fasting such as 8 hours window of eating and 16 hours of fasting daily.  I advise he stop eating the September 24 buffet.  Reduce portion sizes particular meat and cheese.  I reviewed labs he had in September at the emergency department for liver kidney electrolytes and blood counts and thyroid.  Thyroid normal, blood  counts normal, comprehensive was normal except for elevated liver test.  I suspect fatty liver disease. we will order an abdominal ultrasound  Elevated LFTs-we will plan to get abdominal ultrasound.  We will recheck liver test in 1 to 2 months.  Of note normal hepatitis screen 12/2018  Advise of the need for weight loss through healthy diet and exercise  Polyuria, screen for diabetes-labs as below  Tahsin was seen today for abdominal pain and back pain.  Diagnoses and all orders for this visit:  Abdominal pain, unspecified abdominal  location -     POCT URINALYSIS DIP (CLINITEK) -     US Abdomen Complete; Future  Elevated LFTs -     US Abdomen Complete; Future  Constipation, unspecified constipation type  Abdominal bloating  BMI 37.0-37.9, adult  Screening for diabetes mellitus -     POCT glycosylated hemoglobin (Hb A1C)  Polyuria -     POCT glycosylated hemoglobin (Hb A1C) -     POCT URINALYSIS DIP (CLINITEK)  Other orders -     famotidine (PEPCID) 20 MG tablet; Take 1 tablet (20 mg total) by mouth daily. -     polyethylene glycol powder (GLYCOLAX/MIRALAX) 17 GM/SCOOP powder; Take 17 g by mouth daily.     Kristian Covey, PA-C  [facility] [phone] (phone) [phone] (fax)  [facility]

## 2019-11-27 ENCOUNTER — Telehealth: Payer: Self-pay | Admitting: Medical

## 2019-11-27 NOTE — Telephone Encounter (Signed)
Patient needs status update on US abdomen

## 2019-11-27 NOTE — Telephone Encounter (Signed)
Pt called states he was seen last week for abdominal pain and was told that we were sending for Ultrasound but he has not heard anything

## 2019-11-28 NOTE — Telephone Encounter (Signed)
Patient has been updated on status. Patient was also provided the number to call [facility] imaging.

## 2019-12-09 ENCOUNTER — Ambulatory Visit (INDEPENDENT_AMBULATORY_CARE_PROVIDER_SITE_OTHER): Payer: Commercial Managed Care - PPO | Admitting: Cardiovascular Disease

## 2019-12-09 ENCOUNTER — Encounter: Payer: Self-pay | Admitting: Cardiovascular Disease

## 2019-12-09 ENCOUNTER — Other Ambulatory Visit: Payer: Self-pay

## 2019-12-09 VITALS — BP 120/80 | HR 71 | Ht 65.0 in | Wt 225.0 lb

## 2019-12-09 DIAGNOSIS — G4733 Obstructive sleep apnea (adult) (pediatric): Secondary | ICD-10-CM

## 2019-12-09 DIAGNOSIS — R42 Dizziness and giddiness: Secondary | ICD-10-CM

## 2019-12-09 DIAGNOSIS — I10 Essential (primary) hypertension: Secondary | ICD-10-CM

## 2019-12-09 DIAGNOSIS — R0789 Other chest pain: Secondary | ICD-10-CM

## 2019-12-09 DIAGNOSIS — Z9989 Dependence on other enabling machines and devices: Secondary | ICD-10-CM

## 2019-12-09 NOTE — Patient Instructions (Signed)
Medication Instructions:  Your physician recommends that you continue on your current medications as directed. Please refer to the Current Medication list given to you today.   *If you need a refill on your cardiac medications before your next appointment, please call your pharmacy*  Lab Work: NONE   Testing/Procedures: NONE  Follow-Up: At [facility], you and your health needs are our priority.  As part of our continuing mission to provide you with exceptional heart care, we have created designated Provider Care Teams.  These Care Teams include your primary Cardiologist (physician) and Advanced Practice Providers (APPs -  Physician Assistants and Nurse Practitioners) who all work together to provide you with the care you need, when you need it.  We recommend signing up for the patient portal called "MyChart".  Sign up information is provided on this After Visit Summary.  MyChart is used to connect with patients for Virtual Visits (Telemedicine).  Patients are able to view lab/test results, encounter notes, upcoming appointments, etc.  Non-urgent messages can be sent to your provider as well.   To learn more about what you can do with MyChart, go to [email or web address].    Your next appointment:   12 month(s) You will receive a reminder letter in the mail two months in advance. If you don't receive a letter, please call our office to schedule the follow-up appointment.   The format for your next appointment:   In Person  Provider:   You may see Tiffany Westover, MD or one of the following Advanced Practice Providers on your designated Care Team:    Luke Kilroy, PA-C  Callie Goodrich, PA-C  Jesse Cleaver, FNP    

## 2019-12-09 NOTE — Progress Notes (Signed)
Cardiology Office Note   Date:  12/09/2019   ID:  Bobby Kelly, DOB [birth year], MRN [id number]  PCP:  Jac Canavan, PA-C  Cardiologist:   Chilton Si, MD   No chief complaint on file.    History of Present Illness: Bobby Kelly is a 39 y.o. male with chronic systolic and diastolic heart failure (resolved) here for follow up.  Bobby Kelly first saw [name] 08/2016 when he was referred for tachycardia, chest pain, and shortness of breath.  He was noted to have episodes of tachycardia to the 100s on his Fitbit.  His heart rate increased to the 130s with minimal exertion.  He had recently been started on antihypertensives by his primary care provider.  He had an echocardiogram 09/19/2016 that revealed LVEF 35 to 40% with grade 2 diastolic dysfunction.  Hydrochlorothiazide and amlodipine were switched to losartan and metoprolol.  He had a repeat echocardiogram 04/2017 that revealed improvement in his LVEF to 50 to 55%.  He followed up with [name] 09/27/2017 and losartan was reduced due to low blood pressures.   [name] noted that he had episodes of sinus tachycardia.  He had him wear a 48-hour Holter that did not reveal any other significant arrhythmias.  He had an exercise Myoview 09/12/2016 that revealed LVEF 44% with a concern for inferior infarct with peri-infarct ischemia versus diaphragmatic attenuation.  Bobby Kelly was seen in the ED 09/26/2017 with dyspnea.  He became short of breath and dizzy when driving home.  Cardiac enzymes were negative and his EKG was he was then referred without ischemic changes.   At his last appointment Bobby Kelly was referred for sleep study and was found to have OSA.  He has an upcoming CPAP titration study.  Bobby Kelly continues to have intermittent chest pain.  Two weeks ago he noted chest pain when sitting down.  It lasted consistently for a few days.   He hadn't been doing anything strenous and it felt lke a pinching sensation. It was worse when sneezing  or moving quickly.  There was no associated shortness of breath, nausea or diaphoresis.  He notices some shortness of breath when sleeping or when he first wakes up.  He doesn't feel rested in the morning.  He isn't getting any exercise outside of work.  Bobby Kelly denies lower extremity edema, orthopnea or PND.   Lately he notes that he hasn't been feeling well.  He feels like his heart can't keep up .  He gets tired and short of breath when he gets up in the mornings.  He has episodes of dizziness and presyncope.  He was seen in the ED 9/10 with palpitations and dizziness.  He had an episode of dizziness while on telemetry and vitals were unremarkable at the time.  He has occasional sharp chest pain that is random and not with exertion.  Lately he has not been getting much exercise lately.  He called the office and was recommended that he increase metoprolol to 50 mg but he never did so.  He brings a log of his blood pressure and heart rates showing that his blood pressure has been mostly in the 1 teens to 130s.  Heart rates have typically been under 100 but he notes that occasionally they do go over 100 bpm.  He is unsure whether something truly wrong with his heart or he is feeling anxious.  He reports erectile dysfunction and his hands have been cold lately.  He  did take a couple trips to the beach where he was in the car for for 5 hours.  He denies any calf pain.  He uses his CPAP regularly but does report some orthopnea and PND.  He has not noted any lower extremity edema.  At his last appointment he reported dizziness and palpitations.  He was referred for an echo that revealed LVEF 65 to 70% with normal diastolic function.  It was otherwise unremarkable.  Metoprolol was increased.  He continues to have episodes of dizziness.  He thinks that it may be vertigo.  It seems to occur after lunch.  It sometimes happens when driving.  He first gets a headache and starts feeling faint.  He then starts feeling his  heart race.  It subsides if he lays down and closes his eyes for 20 minutes and it goes away.  His BP is stable but his heart rate increases to 120/130, but he thinks this is related to anxiety.  He has been using his CPAP machine consistently for over a year.  He notes that he has not been getting much exercise lately.  In the past he was going for walks after work but has not done it as much recently.  He has no exertional chest pain.  He does get tired after walking long distances.  He has no lower extremity edema, orthopnea, or PND.  He has some episodes of abdominal pain and is working with his PCP to evaluate this.   Past Medical History:  Diagnosis Date  . Allergy   . CHF (congestive heart failure) (HCC) 2018  . Cholelithiasis 2018   cholecystectomy with complication, drain   . Dry skin    nasal folds  . Hypertension   . OSA (obstructive sleep apnea)   . Thyroid disease    hyperactive in high school, had oral therapy.    . Wears contact lenses     Past Surgical History:  Procedure Laterality Date  . APPENDECTOMY    . CHOLECYSTECTOMY N/A 12/03/2016   Procedure: LAPAROSCOPIC CHOLECYSTECTOMY;  Surgeon: Violeta Gelinas, MD;  Location: [facility] OR;  Service: General;  Laterality: N/A;  . ERCP N/A 12/19/2016   Procedure: ENDOSCOPIC RETROGRADE CHOLANGIOPANCREATOGRAPHY (ERCP);  Surgeon: Vida Rigger, MD;  Location: [facility] ENDOSCOPY;  Service: Endoscopy;  Laterality: N/A;  . ESOPHAGOGASTRODUODENOSCOPY (EGD) WITH PROPOFOL N/A 12/13/2016   Procedure: ESOPHAGOGASTRODUODENOSCOPY (EGD) WITH PROPOFOL;  Surgeon: Willis Modena, MD;  Location: [facility] ENDOSCOPY;  Service: Endoscopy;  Laterality: N/A;  . ESOPHAGOGASTRODUODENOSCOPY (EGD) WITH PROPOFOL N/A 12/15/2016   Procedure: ESOPHAGOGASTRODUODENOSCOPY (EGD) WITH PROPOFOL;  Surgeon: Willis Modena, MD;  Location: [facility] ENDOSCOPY;  Service: Endoscopy;  Laterality: N/A;  . ESOPHAGOGASTRODUODENOSCOPY (EGD) WITH PROPOFOL N/A 04/11/2017   Procedure:  ESOPHAGOGASTRODUODENOSCOPY (EGD) WITH PROPOFOL;  Surgeon: Vida Rigger, MD;  Location: [facility] ENDOSCOPY;  Service: Endoscopy;  Laterality: N/A;  side viewing scope- biliary stent removal  . IR CATHETER TUBE CHANGE  12/23/2016  . IR RADIOLOGIST EVAL & MGMT  01/03/2017  . IR RADIOLOGIST EVAL & MGMT  01/17/2017  . IR RADIOLOGIST EVAL & MGMT  01/25/2017  . IR RADIOLOGIST EVAL & MGMT  02/09/2017  . IR RADIOLOGIST EVAL & MGMT  02/21/2017  . IR THORACENTESIS ASP PLEURAL SPACE W/IMG GUIDE  12/23/2016     Current Outpatient Medications  Medication Sig Dispense Refill  . acetaminophen (TYLENOL) 325 MG tablet Take 325-650 mg by mouth every 6 (six) hours as needed for mild pain, moderate pain or headache.  (Patient not taking: Reported on  11/20/2019)    . acetaminophen (TYLENOL) 500 MG tablet Take 500-1,000 mg by mouth every 6 (six) hours as needed for mild pain or headache. (Patient not taking: Reported on 11/20/2019)    . aspirin EC 325 MG tablet Take 650 mg by mouth as needed (for headaches). (Patient not taking: Reported on 11/20/2019)    . bismuth subsalicylate (PEPTO BISMOL) 262 MG/15ML suspension Take 30 mLs by mouth every 6 (six) hours as needed.  (Patient not taking: Reported on 11/20/2019)    . famotidine (PEPCID) 20 MG tablet Take 1 tablet (20 mg total) by mouth daily. 8 tablet 0  . losartan (COZAAR) 25 MG tablet TAKE 1 TABLET(25 MG) BY MOUTH DAILY (Patient taking differently: Take 25 mg by mouth daily. ) 90 tablet 2  . meclizine (ANTIVERT) 25 MG tablet Take 1 tablet (25 mg total) by mouth 3 (three) times daily as needed for dizziness. 30 tablet 0  . metoprolol succinate (TOPROL-XL) 50 MG 24 hr tablet Take 1 tablet (50 mg total) by mouth daily. 90 tablet 3  . nitroGLYCERIN (NITROSTAT) 0.4 MG SL tablet Place 1 tablet under tongue every 5 minutes prn for chest pain up to 2 times and if no improvement, go to the emergency room (Patient not taking: Reported on 11/20/2019) 50 tablet 3  . NON FORMULARY Take 1  tablet by mouth See admin instructions. CBD gummies- Chew 1 gummie by mouth as needed to decrease anxiety (Patient not taking: Reported on 11/20/2019)    . polyethylene glycol powder (GLYCOLAX/MIRALAX) 17 GM/SCOOP powder Take 17 g by mouth daily. 17 g 0  . Propylene Glycol (SYSTANE COMPLETE OP) Place 1-2 drops into both eyes See admin instructions. Place 1-2 drops into each eye prior to putting in contacts daily     No current facility-administered medications for this visit.    Allergies:   Patient has no known allergies.    Social History:  The patient  reports that he has never smoked. He has never used smokeless tobacco. He reports current alcohol use of about 4.0 standard drinks of alcohol per week. He reports that he does not use drugs.   Family History:  The patient's family history includes Cancer in his maternal grandmother; Cataracts in his mother; Diabetes in an other family member; Hyperlipidemia in his father; Hypertension in his mother; Pancreatic cancer in his maternal grandmother.    ROS:  Please see the history of present illness.   Otherwise, review of systems are positive for none.   All other systems are reviewed and negative.    PHYSICAL EXAM: VS:  BP 120/80   Pulse 71   Ht 5\' 5"  (1.651 m)   Wt 225 lb (102.1 kg)   SpO2 97%   BMI 37.44 kg/m  , BMI Body mass index is 37.44 kg/m. GENERAL:  Well appearing HEENT: Pupils equal round and reactive, fundi not visualized, oral mucosa unremarkable NECK:  No jugular venous distention, waveform within normal limits, carotid upstroke brisk and symmetric, no bruits LUNGS:  Clear to auscultation bilaterally HEART:  RRR.  PMI not displaced or sustained,S1 and S2 within normal limits, no S3, no S4, no clicks, no rubs, no murmurs ABD:  Flat, positive bowel sounds normal in frequency in pitch, no bruits, no rebound, no guarding, no midline pulsatile mass, no hepatomegaly, no splenomegaly EXT:  2 plus pulses throughout, no edema, no  cyanosis no clubbing SKIN:  No rashes no nodules NEURO:  Cranial nerves II through XII grossly intact, motor grossly  intact throughout [facility]:  Cognitively intact, oriented to person place and time   EKG:  EKG is not ordered today. The ekg ordered today demonstrates sinus rhythm.  Rate 89 bpm.    Lexiscan Myoview 09/2016:  Nuclear stress EF: 44%.  No significant change in nonspecific ST abnormality during infusion.  There is a small defect of mild severity present in the basal inferior and mid inferior location. The defect is partially reversible and consistent with infarct with peri infarct ischemia vs. Variations in diaphragmatic attenuation artifact.  The left ventricular ejection fraction is moderately decreased (30-44%).  This is an intermediate risk study.  Recommend 2D echo for further assessment of LVF and inferior wall motion.   Echo 09/19/16: Study Conclusions  - Left ventricle: The cavity size was normal. Wall thickness was   normal. Systolic function was moderately reduced. The estimated   ejection fraction was in the range of 35% to 40%. Features are   consistent with a pseudonormal left ventricular filling pattern,   with concomitant abnormal relaxation and increased filling   pressure (grade 2 diastolic dysfunction). - Right atrium: The atrium was mildly dilated.  Echo 04/10/17: Study Conclusions  - Left ventricle: The cavity size was normal. Wall thickness was   normal. Systolic function was normal. The estimated ejection   fraction was in the range of 50% to 55%. Wall motion was normal;   there were no regional wall motion abnormalities. The study is   not technically sufficient to allow evaluation of LV diastolic   function. - Mitral valve: Calcified annulus. Mildly thickened leaflets .  Echo 9/22021: 1. Left ventricular ejection fraction, by estimation, is 65 to 70%. The  left ventricle has normal function. The left ventricle has no regional  wall  motion abnormalities. Left ventricular diastolic parameters were  normal.  2. Right ventricular systolic function is normal. The right ventricular  size is normal. There is normal pulmonary artery systolic pressure.  3. The mitral valve is grossly normal. Trivial mitral valve  regurgitation.  4. The aortic valve is tricuspid. Aortic valve regurgitation is not  visualized. Mild aortic valve sclerosis is present, with no evidence of  aortic valve stenosis.   Recent Labs: 10/31/2019: ALT 78; BNP 4.1; BUN 13; Creatinine, Ser 1.09; Hemoglobin 16.9; Platelets 304; Potassium 4.4; Sodium 139; TSH 1.670    Lipid Panel    Component Value Date/Time   CHOL 175 11/06/2017 0908   TRIG 104 11/06/2017 0908   HDL 59 11/06/2017 0908   CHOLHDL 3.0 11/06/2017 0908   CHOLHDL 3.3 11/03/2016 0811   LDLCALC 95 11/06/2017 0908   LDLCALC 94 11/03/2016 0811      Wt Readings from Last 3 Encounters:  12/09/19 225 lb (102.1 kg)  11/20/19 225 lb 6.4 oz (102.2 kg)  10/30/19 225 lb (102.1 kg)      ASSESSMENT AND PLAN:  # Atypical chest pain: Resolved.  Symptoms were atypical.  No coronary calcification was noted on his chest CT 09/2017.    # Shortness of breath:  # Palpitations: It seems that his episodes are triggered by headache.  I suspect this may be vasovagal in nature.  Recommended that he increase his fluid intake.  # Hypertension: Blood pressure stable on losartan and metoprolol.  # Obestiy:  Encouraged to increase exercise to at least 150 minutes per week.   # OSA: Continue using CPAP.  # Chronic systolic and diastolic heart failure:   LVEF has normalized to 65 to 70%.  This was  likely tachycardia mediated.  Continue losartan and metoprolol as above.  Current medicines are reviewed at length with the patient today.  The patient does not have concerns regarding medicines.  The following changes have been made:  no change  Labs/ tests ordered today include:   No orders of the  defined types were placed in this encounter.    Disposition:   FU with Deng Kemler C. Duke Salvia, MD, [facility] in 1 year   Signed, Camrin Lapre C. Duke Salvia, MD, [facility]  12/09/2019 9:27 AM    [facility]

## 2019-12-10 ENCOUNTER — Ambulatory Visit
Admission: RE | Admit: 2019-12-10 | Discharge: 2019-12-10 | Disposition: A | Discharge status: Home / Self Care | Payer: Commercial Managed Care - PPO | Source: Ambulatory Visit | Attending: Medical | Admitting: Medical

## 2019-12-10 DIAGNOSIS — R7989 Other specified abnormal findings of blood chemistry: Secondary | ICD-10-CM

## 2019-12-10 DIAGNOSIS — R109 Unspecified abdominal pain: Secondary | ICD-10-CM

## 2019-12-11 ENCOUNTER — Other Ambulatory Visit: Payer: Self-pay

## 2019-12-11 DIAGNOSIS — Z6837 Body mass index (BMI) 37.0-37.9, adult: Secondary | ICD-10-CM

## 2019-12-25 ENCOUNTER — Encounter: Payer: Self-pay | Admitting: Medical

## 2019-12-25 ENCOUNTER — Other Ambulatory Visit: Payer: Self-pay

## 2019-12-25 ENCOUNTER — Ambulatory Visit (INDEPENDENT_AMBULATORY_CARE_PROVIDER_SITE_OTHER): Payer: Commercial Managed Care - PPO | Admitting: Medical

## 2019-12-25 VITALS — BP 122/76 | HR 68 | Temp 98.5°F | Wt 220.4 lb

## 2019-12-25 DIAGNOSIS — K760 Fatty (change of) liver, not elsewhere classified: Secondary | ICD-10-CM | POA: Diagnosis not present

## 2019-12-25 DIAGNOSIS — Z6836 Body mass index (BMI) 36.0-36.9, adult: Secondary | ICD-10-CM | POA: Insufficient documentation

## 2019-12-25 DIAGNOSIS — I10 Essential (primary) hypertension: Secondary | ICD-10-CM

## 2019-12-25 DIAGNOSIS — K5900 Constipation, unspecified: Secondary | ICD-10-CM

## 2019-12-25 DIAGNOSIS — R059 Cough, unspecified: Secondary | ICD-10-CM | POA: Diagnosis not present

## 2019-12-25 DIAGNOSIS — I428 Other cardiomyopathies: Secondary | ICD-10-CM

## 2019-12-25 DIAGNOSIS — R7989 Other specified abnormal findings of blood chemistry: Secondary | ICD-10-CM

## 2019-12-25 DIAGNOSIS — R7301 Impaired fasting glucose: Secondary | ICD-10-CM

## 2019-12-25 LAB — POC COVID19 BINAXNOW: SARS Coronavirus 2 Ag: NEGATIVE

## 2019-12-25 NOTE — Patient Instructions (Addendum)
Elevated liver test-reviewed CMET and liver tests from 10/2019, mildly elevated LFTs.  ultrasound 12/10/2019 showing diffuse fatty infiltration of liver, prior cholecystectomy, no other abnormal findings.  Hepatitis panel showing negative hepatitis A, B, and C 12/2018.  Most likely cause of his elevated liver test is fatty liver disease.  He is working on lifestyle changes and weight loss, has recently cut back on alcohol  Obesity-continue lifestyle changes, has lost 5 pounds since last visit, is working on intermittent fasting  Constipation-improved on diet changes and MiraLAX.  Continue using MiraLAX as needed  Cough-begin Zyrtec or benadryl at bedtime for the next 2 weeks  History of cardiomyopathy, HTN, impaired glucose - cholesterol lab today  Abdominal pain   Begin Pepcid over the counter for the next 2 weeks for pain/acid indigestion  If you continue to have pain, we can refer back to Dr. Ewing Schlein for pain and to further discuss fatty liver  Work on daily back and general stretching routine to help with back/side pain    Vaccines: I only have prior flu shot on file for you  Given your underlying health issues, the following vaccinations are recommended:  Tetanus booster every 10 years  Pneumonia vaccine.  There are 2 different pneumonia vaccines I recommend he have both of them  Covid vaccination  Yearly flu vaccination  Consider hepatitis vaccination given the fatty liver disease.  There are vaccines for hepatitis A and B

## 2019-12-25 NOTE — Progress Notes (Addendum)
Subjective:     Patient ID: Bobby Kelly, male   DOB: 06-Apr-1980, 39 y.o.   MRN: [id number] Chief Complaint  Patient presents with  . OTHER    F/U ON LIVER TEST AND ALSO HAS A COUGH,    HPI  Patient Bobby Kelly is a 39 year old male in today for follow-up of elevated liver function tests. Ultrasound performed on 12/11/19 showed fatty infiltration of the liver. Reports he has cut out alcohol since finding out about his elevated LFTs. Has had 4-5 alcoholic drinks in the past month. Denies tobacco use. States BPs have been well controlled over the last month and have been within goal range.   Started intermittent fasting in October. Doesn't eat from 11am-7pm 5 out of 7 days a week. Looking for a gym to exercise consistently. States clothes have been feeling more loose and he's been trying to be more mindful about food choices.    Also c/o a cough that started two weeks ago. Started as a tickle in his throat, now is more productive. Denies chest tightness, shortness of breath, wheezing. Reports slight runny nose symptoms for the past few days.   Constipation for 2 months. Took miralax for the 7 days as prescribed after last visit and constipation resolved. Started taking PRN and has intermittent constipation and diarrhea. States he drinks about 64 oz of water per day.   Denies blood in stool.  No family history of colon cancer.  Review of Systems  Constitutional: Negative for chills, fatigue and fever.  HENT: Positive for postnasal drip. Negative for ear pain, sinus pain and sore throat.   Respiratory: Positive for cough. Negative for chest tightness, shortness of breath and wheezing.   Cardiovascular: Negative for chest pain and palpitations.  Gastrointestinal: Positive for constipation and diarrhea. Negative for nausea and vomiting.  Genitourinary: Negative for decreased urine volume, frequency and urgency.  Neurological: Negative for weakness, numbness and headaches.     ROS as in  subjective     Objective:   Physical Exam  BP 122/76   Pulse 68   Temp 98.5 F (36.9 C)   Wt 220 lb 6.4 oz (100 kg)   BMI 36.68 kg/m   Wt Readings from Last 3 Encounters:  12/25/19 220 lb 6.4 oz (100 kg)  12/09/19 225 lb (102.1 kg)  11/20/19 225 lb 6.4 oz (102.2 kg)   Gen: wd, wn, nad Lungs clear Back nontender, normal range of motion Abdomen without obvious tenderness, no deformity no swelling no mass no rebound, positive bowel sounds Pulses normal Legs without edema or asymmetry       Assessment/Plan:    Encounter Diagnoses  Name Primary?  . Constipation, unspecified constipation type Yes  . BMI 36.0-36.9,adult   . Fatty liver   . Elevated LFTs   . Cough   . Other cardiomyopathy (HCC)   . Essential hypertension   . Impaired fasting blood sugar     Elevated liver test-reviewed CMET and liver tests from 10/2019, mildly elevated LFTs.  ultrasound 12/10/2019 showing diffuse fatty infiltration of liver, prior cholecystectomy, no other abnormal findings.  Hepatitis panel showing negative hepatitis A, B, and C 12/2018.  Most likely cause of his elevated liver test is fatty liver disease.  He is working on lifestyle changes and weight loss, has recently cut back on alcohol  Obesity-congratulated him on weight loss since last visit.  Continue lifestyle changes, has lost 5 pounds since last visit, is working on intermittent fasting  Constipation-improved on  diet changes and MiraLAX.  Using MiraLAX.  Cough-discussed possible differential, can use Zyrtec or Benadryl at nighttime for the next 2 weeks  Hx/o cardiomyopathy, HTN, impaired glucose - due for fasting lipid panel.  He was nonfasting prior visits.  Lipid panel today fasting  Abdominal pain-discussed potential differential.  I suspect some of this could be back pain related musculoskeletal.  We discussed stretching.  We reviewed the recent chest x-ray and abdominal ultrasound that does not show any specific cause for  pain.  He can begin some Pepcid over-the-counter for the next 2 weeks.  Continue efforts to lose weight and continue working with constipation treatment.  If not improving with abdominal pain in the coming weeks we can refer back to Dr. Ewing Schlein  Vaccines: I only have prior flu shot on file for you  Given your underlying health issues, the following vaccinations are recommended:  Tetanus booster every 10 years  Pneumonia vaccine.  There are 2 different pneumonia vaccines I recommend he have both of them  Covid vaccination  Yearly flu vaccination  Consider hepatitis vaccination given the fatty liver disease.  There are vaccines for hepatitis A and B  He declines vaccines today but will consider  Arvind was seen today for other.  Diagnoses and all orders for this visit:  Cough -     POC COVID-19  Constipation, unspecified constipation type  BMI 36.0-36.9,adult  Fatty liver -     Lipid panel -     Hepatitis B surface antibody,quantitative  Elevated LFTs -     Lipid panel -     Hepatitis B surface antibody,quantitative  Other cardiomyopathy (HCC)  Essential hypertension -     Lipid panel  Impaired fasting blood sugar   I reviewed notes from student Rennis Chris, NP student.  I also reviewed the HPI, relevant history, examined patient, and formulated plan.   F/u pending labs

## 2019-12-26 ENCOUNTER — Other Ambulatory Visit: Payer: Self-pay | Admitting: Medical

## 2019-12-26 LAB — LIPID PANEL
Chol/HDL Ratio: 3.2 ratio (ref 0.0–5.0)
Cholesterol, Total: 155 mg/dL (ref 100–199)
HDL: 48 mg/dL (ref >39)
LDL Chol Calc (NIH): 81 mg/dL (ref 0–99)
Triglycerides: 153 mg/dL — ABNORMAL HIGH (ref 0–149)
VLDL Cholesterol Cal: 26 mg/dL (ref 5–40)

## 2019-12-26 LAB — HEPATITIS B SURFACE ANTIBODY, QUANTITATIVE: Hepatitis B Surf Ab Quant: <3.1 m[IU]/mL — ABNORMAL LOW (ref >9.9)

## 2019-12-26 MED ORDER — FAMOTIDINE 20 MG PO TABS: 20.0000 mg | ORAL_TABLET | Freq: Every day | ORAL | 0 refills | Status: DC

## 2020-01-29 ENCOUNTER — Telehealth: Payer: Self-pay | Admitting: Medical

## 2020-01-29 NOTE — Telephone Encounter (Signed)
I called and spoke to patient to cancel tomorrow 01/30/20 appt due to me/provider not feeling well.   He was understanding.  Please find a time to get him back in soon after January 1.   If any meds need refilling now, please send to nurse.  I am sending this to Genera and Shauna 

## 2020-01-30 ENCOUNTER — Encounter: Payer: Commercial Managed Care - PPO | Admitting: Medical

## 2020-01-30 NOTE — Telephone Encounter (Signed)
Pt is coming in June 10 and pt does not need any refills

## 2020-02-04 ENCOUNTER — Encounter: Payer: Commercial Managed Care - PPO | Admitting: Medical

## 2020-02-17 ENCOUNTER — Other Ambulatory Visit: Payer: Self-pay

## 2020-02-17 ENCOUNTER — Encounter: Payer: Self-pay | Admitting: Medical

## 2020-02-17 ENCOUNTER — Ambulatory Visit (INDEPENDENT_AMBULATORY_CARE_PROVIDER_SITE_OTHER): Payer: Commercial Managed Care - PPO | Admitting: Medical

## 2020-02-17 VITALS — BP 138/88 | HR 75 | Ht 65.0 in | Wt 222.2 lb

## 2020-02-17 DIAGNOSIS — Z6836 Body mass index (BMI) 36.0-36.9, adult: Secondary | ICD-10-CM | POA: Diagnosis not present

## 2020-02-17 DIAGNOSIS — R7989 Other specified abnormal findings of blood chemistry: Secondary | ICD-10-CM

## 2020-02-17 DIAGNOSIS — R42 Dizziness and giddiness: Secondary | ICD-10-CM

## 2020-02-17 DIAGNOSIS — Z23 Encounter for immunization: Secondary | ICD-10-CM | POA: Diagnosis not present

## 2020-02-17 DIAGNOSIS — N529 Male erectile dysfunction, unspecified: Secondary | ICD-10-CM | POA: Diagnosis not present

## 2020-02-17 DIAGNOSIS — Z800 Family history of malignant neoplasm of digestive organs: Secondary | ICD-10-CM

## 2020-02-17 DIAGNOSIS — G4733 Obstructive sleep apnea (adult) (pediatric): Secondary | ICD-10-CM

## 2020-02-17 DIAGNOSIS — K760 Fatty (change of) liver, not elsewhere classified: Secondary | ICD-10-CM

## 2020-02-17 DIAGNOSIS — Z0000 Encounter for general adult medical examination without abnormal findings: Secondary | ICD-10-CM

## 2020-02-17 NOTE — Progress Notes (Signed)
Subjective:   HPI  Bobby Kelly is a 40 y.o. male who presents for Chief Complaint  Patient presents with  . Annual Exam    Physical with fasting labs. Had coffee with sugar this morning around 8am     Patient Care Team: Shahmeer Bunn, Kermit Balo, PA-C as PCP - General (Family Medicine) Chilton Si, MD as PCP - Cardiology (Cardiology) Sees dentist Sees eye doctor  Concerns: Here for physical today.  In addition to meds listed he is taking brain booster vitamins  OSA-compliant with CPAP  He is concerned about erectile dysfunction.  He can get some erections but not very good and they do not last.  He would like to try some Viagra or something similar.  No prior use of similar medication.  He is not on nitroglycerin or nitrates.  He is compliant with his medicines for blood pressure  Occasionally gets dizzy after eating but not every time.  His blood pressure has been checked and was normal when he feels this way.  He is not checking blood sugars.   Reviewed their medical, surgical, family, social, medication, and allergy history and updated chart as appropriate.  Past Medical History:  Diagnosis Date  . Allergy   . CHF (congestive heart failure) (HCC) 2018  . Cholelithiasis 2018   cholecystectomy with complication, drain   . Dry skin    nasal folds  . Hypertension   . OSA (obstructive sleep apnea)   . Thyroid disease    hyperactive in high school, had oral therapy.    . Wears contact lenses     Past Surgical History:  Procedure Laterality Date  . APPENDECTOMY    . CHOLECYSTECTOMY N/A 12/03/2016   Procedure: LAPAROSCOPIC CHOLECYSTECTOMY;  Surgeon: Violeta Gelinas, MD;  Location: [facility] OR;  Service: General;  Laterality: N/A;  . ERCP N/A 12/19/2016   Procedure: ENDOSCOPIC RETROGRADE CHOLANGIOPANCREATOGRAPHY (ERCP);  Surgeon: Vida Rigger, MD;  Location[facility] ENDOSCOPY;  Service: Endoscopy;  Laterality: N/A;  . ESOPHAGOGASTRODUODENOSCOPY (EGD) WITH PROPOFOL N/A 12/13/2016    Procedure: ESOPHAGOGASTRODUODENOSCOPY (EGD) WITH PROPOFOL;  Surgeon: Willis Modena, MD;  Location: [facility] ENDOSCOPY;  Service: Endoscopy;  Laterality: N/A;  . ESOPHAGOGASTRODUODENOSCOPY (EGD) WITH PROPOFOL N/A 12/15/2016   Procedure: ESOPHAGOGASTRODUODENOSCOPY (EGD) WITH PROPOFOL;  Surgeon: Willis Modena, MD;  Location: [facility] ENDOSCOPY;  Service: Endoscopy;  Laterality: N/A;  . ESOPHAGOGASTRODUODENOSCOPY (EGD) WITH PROPOFOL N/A 04/11/2017   Procedure: ESOPHAGOGASTRODUODENOSCOPY (EGD) WITH PROPOFOL;  Surgeon: Vida Rigger, MD;  Location: [facility] ENDOSCOPY;  Service: Endoscopy;  Laterality: N/A;  side viewing scope- biliary stent removal  . IR CATHETER TUBE CHANGE  12/23/2016  . IR RADIOLOGIST EVAL & MGMT  01/03/2017  . IR RADIOLOGIST EVAL & MGMT  01/17/2017  . IR RADIOLOGIST EVAL & MGMT  01/25/2017  . IR RADIOLOGIST EVAL & MGMT  02/09/2017  . IR RADIOLOGIST EVAL & MGMT  02/21/2017  . IR THORACENTESIS ASP PLEURAL SPACE W/IMG GUIDE  12/23/2016    Family History  Problem Relation Age of Onset  . Hypertension Mother   . Cataracts Mother   . Hyperlipidemia Father   . Diabetes Father   . Pancreatic cancer Maternal Grandmother   . Cancer Maternal Grandmother        pancreatic  . Diabetes Other   . Cancer Maternal Aunt        pancreatic  . Heart disease Neg Hx   . Stroke Neg Hx      Current Outpatient Medications:  .  losartan (COZAAR) 25 MG tablet, TAKE 1 TABLET(25  MG) BY MOUTH DAILY (Patient taking differently: Take 25 mg by mouth daily.), Disp: 90 tablet, Rfl: 2 .  metoprolol succinate (TOPROL-XL) 50 MG 24 hr tablet, Take 1 tablet (50 mg total) by mouth daily., Disp: 90 tablet, Rfl: 3 .  Propylene Glycol (SYSTANE COMPLETE OP), Place 1-2 drops into both eyes See admin instructions. Place 1-2 drops into each eye prior to putting in contacts daily, Disp: , Rfl:   No Known Allergies     Review of Systems Constitutional: -fever, -chills, -sweats, -unexpected weight change, -decreased appetite,  -fatigue Allergy: -sneezing, -itching, -congestion Dermatology: -changing moles, --rash, -lumps ENT: -runny nose, -ear pain, -sore throat, -hoarseness, -sinus pain, -teeth pain, - ringing in ears, -hearing loss, -nosebleeds Cardiology: -chest pain, -palpitations, -swelling, -difficulty breathing when lying flat, -waking up short of breath Respiratory: -cough, -shortness of breath, -difficulty breathing with exercise or exertion, -wheezing, -coughing up blood Gastroenterology: -abdominal pain, -nausea, -vomiting, -diarrhea, -constipation, -blood in stool, -changes in bowel movement, -difficulty swallowing or eating Hematology: -bleeding, -bruising  Musculoskeletal: -joint aches, -muscle aches, -joint swelling, -back pain, -neck pain, -cramping, -changes in gait Ophthalmology: denies vision changes, eye redness, itching, discharge Urology: -burning with urination, -difficulty urinating, -blood in urine, -urinary frequency, -urgency, -incontinence Neurology: -headache, -weakness, -tingling, -numbness, -memory loss, -falls, +dizziness Psychology: -depressed mood, -agitation, -sleep problems Male GU: no testicular mass, pain, no lymph nodes swollen, no swelling, no rash.     Objective:  BP 138/88   Pulse 75   Ht 5\' 5"  (1.651 m)   Wt 222 lb 3.2 oz (100.8 kg)   SpO2 97%   BMI 36.98 kg/m   General appearance: alert, no distress, WD/WN, Hispanic male Skin: Darkened coloration around neck suggestive of acanthosis nigricans, no worrisome lesions HEENT: normocephalic, conjunctiva/corneas normal, sclerae anicteric, PERRLA, EOMi, nares patent, no discharge or erythema, pharynx normal Oral cavity: MMM, tongue normal, teeth normal Neck: supple, no lymphadenopathy, no thyromegaly, no masses, normal ROM, no bruits Chest: non tender, normal shape and expansion Heart: RRR, normal S1, S2, no murmurs Lungs: CTA bilaterally, no wheezes, rhonchi, or rales Abdomen: +bs, soft, non tender, non distended, no  masses, no hepatomegaly, no splenomegaly, no bruits Back: non tender, normal ROM, no scoliosis Musculoskeletal: upper extremities non tender, no obvious deformity, normal ROM throughout, lower extremities non tender, no obvious deformity, normal ROM throughout Extremities: no edema, no cyanosis, no clubbing Pulses: 2+ symmetric, upper and lower extremities, normal cap refill Neurological: alert, oriented x 3, CN2-12 intact, strength normal upper extremities and lower extremities, sensation normal throughout, DTRs 2+ throughout, no cerebellar signs, gait normal Psychiatric: normal affect, behavior normal, pleasant  GU: normal male external genitalia,uncircumcised, nontender, no masses, no hernia, no lymphadenopathy Rectal: Deferred   Assessment and Plan :   Encounter Diagnoses  Name Primary?  . Encounter for health maintenance examination in adult Yes  . Need for hepatitis A and B vaccination   . BMI 36.0-36.9,adult   . Erectile dysfunction, unspecified erectile dysfunction type   . Low testosterone   . Dizziness   . Fatty liver   . OSA (obstructive sleep apnea)   . Need for Tdap vaccination   . Elevated LFTs   . Family history of pancreatic cancer     Today you had a preventative care visit or wellness visit.    Topics today may have included healthy lifestyle, diet, exercise, preventative care, vaccinations, sick and well care, proper use of emergency dept and after hours care, as well as other concerns.  Recommendations: Continue to return yearly for your annual wellness and preventative care visits.  This gives [place] a chance to discuss healthy lifestyle, exercise, vaccinations, review your chart record, and perform screenings where appropriate.  I recommend you see your eye doctor yearly for routine vision care.  I recommend you see your dentist yearly for routine dental care including hygiene visits twice yearly.  Continue routine follow-up with  cardiology    Vaccination recommendations were reviewed We discussed vaccine recommendations including updating tetanus, hepatitis A and B, pneumococcal vaccine, COVID-vaccine, yearly flu shot  He declines flu and COVID-vaccine despite the recommendations  Counseled on the Tdap (tetanus, diptheria, and acellular pertussis) vaccine.  Vaccine information sheet given. Tdap vaccine given after consent obtained.  Counseled on the Hepatitis A virus vaccine.  Vaccine information sheet given.  Hepatitis A vaccine given after consent obtained.  Patient was advised to return in 6 months for Hep A #2.  Counseled on the Hepatitis B virus vaccine.  Vaccine information sheet given.  Hepatitis B/Hepsilav B vaccine given after consent obtained.  Patient was advised to return in 67months for Hep B #2/Hepsilav B #2.    Screening for cancer: Colon cancer screening:  Age 42yo  Cancer screening You should do a monthly self testicular exam   We discussed PSA, prostate exam, and prostate cancer screening risks/benefits.   Age 2yo  Skin cancer screening: Check your skin regularly for new changes, growing lesions, or other lesions of concern Come in for evaluation if you have skin lesions of concern.  Lung cancer screening: If you have a greater than 30 pack year history of tobacco use, then you qualify for lung cancer screening with a chest CT scan  We currently don't have screenings for other cancers besides breast, cervical, colon, and lung cancers.  If you have a strong family history of cancer or have other cancer screening concerns, please let me know.    Bone health: Get at least 150 minutes of aerobic exercise weekly Get weight bearing exercise at least once weekly   Heart health: Get at least 150 minutes of aerobic exercise weekly Limit alcohol It is important to maintain a healthy blood pressure and healthy cholesterol numbers   Separate significant issues discussed: BMI greater than  36-counseled on need for weight loss through healthy diet and exercise  Erectile Dysfunction - Reviewed pathophysiology and differential diagnosis of erectile dysfunction with the patient.  Discussed treatment options.  consider Viagra.  Discussed potential risks of medications including hypotension and priapism.  Discussed proper use of medication.  Questions were answered.    Fatty liver disease, elevated LFTs-counseled on the need for weight loss through healthy diet and exercise, low-fat diet, and discussed the long-term complications of fatty liver disease  Low testosterone in the past- given his ED concerns, consider treatment if still low as he was in the past  Dizziness-unclear etiology.  Brief nonspecific episodes.  We discussed monitoring blood pressure or possibly even monitoring blood sugar if this continues to happen.  I reviewed recent cardiology notes in the chart record   OSAS continue CPAP  Family history of pancreatic cancer- we discussed that there is no specific screening recommendations.  We discussed his family history, the need to work on weight loss through healthy diet and exercise and to come in for evaluation if any concerns such as weight loss, lack of appetite, change in stool, body itching or other unusual symptom.  Bobby Kelly was seen today for annual exam.  Diagnoses  and all orders for this visit:  Encounter for health maintenance examination in adult -     Testosterone -     Hepatic function panel -     Iron  Need for hepatitis A and B vaccination  BMI 36.0-36.9,adult  Erectile dysfunction, unspecified erectile dysfunction type -     Testosterone  Low testosterone -     Testosterone  Dizziness  Fatty liver  OSA (obstructive sleep apnea)  Need for Tdap vaccination  Elevated LFTs -     Hepatic function panel -     Iron  Family history of pancreatic cancer  Other orders -     Tdap vaccine greater than or equal to 7yo IM -     Hepatitis A  vaccine adult IM -     Heplisav-B (HepB-CPG) Vaccine   Follow-up pending labs, yearly for physical

## 2020-02-18 LAB — IRON: Iron: 104 ug/dL (ref 38–169)

## 2020-02-18 LAB — HEPATIC FUNCTION PANEL
ALT: 54 IU/L — ABNORMAL HIGH (ref 0–44)
AST: 38 IU/L (ref 0–40)
Albumin: 4.6 g/dL (ref 4.0–5.0)
Alkaline Phosphatase: 88 IU/L (ref 44–121)
Bilirubin Total: 0.4 mg/dL (ref 0.0–1.2)
Bilirubin, Direct: 0.13 mg/dL (ref 0.00–0.40)
Total Protein: 7.5 g/dL (ref 6.0–8.5)

## 2020-02-18 LAB — TESTOSTERONE: Testosterone: 185 ng/dL — ABNORMAL LOW (ref 264–916)

## 2020-03-02 ENCOUNTER — Telehealth: Payer: Self-pay | Admitting: Medical

## 2020-03-02 NOTE — Telephone Encounter (Signed)
Pt called and wanted to check the status on some ED meds that you were going to prescribe

## 2020-03-03 ENCOUNTER — Other Ambulatory Visit: Payer: Self-pay | Admitting: Medical

## 2020-03-03 MED ORDER — SILDENAFIL CITRATE 100 MG PO TABS: ORAL_TABLET | ORAL | 1 refills | Status: DC

## 2020-03-03 NOTE — Telephone Encounter (Signed)
Message has been sent to patient via mychart  

## 2020-03-03 NOTE — Telephone Encounter (Signed)
I did finally hear back from Dr. Duke Salvia  Begin Viagra 1/2 tablet daily as needed for ED about 30 minutes prior to activity.  Do not take any more than 1 tablet in a 24-hour period.  If he has tried 1/2 tablet on a couple different days and is not working then he can do a whole tablet or 100 mg  This cannot be taking within 24 hours of nitroglycerin or nitrites for heart.  He is not currently on this as far as I know.  Let me know in a few weeks how this is working  We had already discussed potential side effects.  However if he needs other information let me know or he can ask the pharmacist as well

## 2020-03-24 ENCOUNTER — Other Ambulatory Visit: Payer: Self-pay | Admitting: Medical

## 2020-04-16 ENCOUNTER — Other Ambulatory Visit (INDEPENDENT_AMBULATORY_CARE_PROVIDER_SITE_OTHER): Payer: Commercial Managed Care - PPO

## 2020-04-16 ENCOUNTER — Other Ambulatory Visit: Payer: Self-pay

## 2020-04-16 DIAGNOSIS — Z23 Encounter for immunization: Secondary | ICD-10-CM | POA: Diagnosis not present

## 2020-04-23 ENCOUNTER — Encounter: Payer: Commercial Managed Care - PPO | Admitting: Medical

## 2020-05-04 ENCOUNTER — Telehealth (INDEPENDENT_AMBULATORY_CARE_PROVIDER_SITE_OTHER): Payer: Commercial Managed Care - PPO | Admitting: Family Medicine

## 2020-05-04 ENCOUNTER — Encounter: Payer: Self-pay | Admitting: Family Medicine

## 2020-05-04 VITALS — BP 126/86 | HR 80 | Temp 97.7°F | Ht 65.0 in | Wt 216.0 lb

## 2020-05-04 DIAGNOSIS — G44209 Tension-type headache, unspecified, not intractable: Secondary | ICD-10-CM

## 2020-05-04 DIAGNOSIS — I10 Essential (primary) hypertension: Secondary | ICD-10-CM | POA: Diagnosis not present

## 2020-05-04 DIAGNOSIS — R0789 Other chest pain: Secondary | ICD-10-CM | POA: Diagnosis not present

## 2020-05-04 DIAGNOSIS — M549 Dorsalgia, unspecified: Secondary | ICD-10-CM

## 2020-05-04 MED ORDER — NAPROXEN 500 MG PO TABS
500.0000 mg | ORAL_TABLET | Freq: Two times a day (BID) | ORAL | 0 refills | Status: DC
Start: 2020-05-04 — End: 2023-03-22

## 2020-05-04 NOTE — Patient Instructions (Signed)
Take naproxen twice daily with food, starting with dinner. Take it until your pain has resolved completely. If you take the full 15 days and still have pain, you should follow up with your regular provider.  Do NOT take any ibuprofen/advil/motrin/aleve/Goody/BC or Excedrin while taking the naproxen. You MAY take Tylenol along with it, if needed for pain.  Warm compresses should help with the chest wall pain and headaches (though you can also try ice on the temples/head, use whichever feels better).  We discussed proper posture for the neck--be sure to keep a neutral neck position rather than looking down.  We showed you stretches for the muscles by the shoulder blades.  Massaging this area may also help.

## 2020-05-04 NOTE — Progress Notes (Signed)
This visit was started as a video visit (2:07pm), but after speaking with him, felt he needed to be seen in office.   History taken over video. Patient did home COVID test, which was negative, and then presented to the office later in the afternoon for visit.  History of Present Illness:  Chief Complaint  Patient presents with  . Headache    VIRTUAL HA, SOB and dizziness. Feeling faint. HA started Th of last week and gradually getting worse. Since last night he has been feeling dizzy-took some pain medication and allergy medication last evening and hasn't helped. No cough. Some upper back pain. No fever.     Patient presents with complaint of HA x 4 days, not improving. He describes pain as a band across the whole top of his head, and sometimes a lot of pressure posteriorly. Pain is described as a squeezing pressure. He also has some pain at the R neck posteriorly.  HA is fairly constant, occasionally intensifies. He reports the headache feels like a caffeine withdrawal headache, but not improved after having coffee. Yesterday took claritin and excedrin migraine.  It helped for a few hours. He has some pressure across forehead, but denies any runny nose, sneezing, PND, eye symptoms.  OSA--compliant with CPAP nightly.  Wife still hears some snoring, no apnea.  Today while on his way to lunch, he had a pain in his chest, then started feeling dizzy and a little lightheaded.  Felt numbness in his tongue. No vertigo or equilibrium component to dizziness. No tachycardia or palpitations. The pain was stabbing, on the right side of the chest.  It lasted 5-6 minutes. He closed his eyes, rested, put his head down, and it gradually improved.  Hasn't recurred since. Had to open his shirt to be able to breathe easier. Pain hasn't recurred.  He gives a history of having intermittent chest pains, off/on, changes from side to side.  He reports having this off/on for a few months, thinks it is  muscular. He reports previous ER evals for CP in past were normal, not cardiac.  Currently, he has a persistent HA, feels tired.  He notes some decreased appetite.  Feels slightly anxious.  He also has some pain in his upper back since yesterday (soreness, like from working out), by his shoulder blades bilaterally.  11/2018 had COVID Not vaccinated No sick contacts   PMH, PSH, SH reviewed A few drinks every 2 weeks (doesn't feel well after drinking) Works--mechanical design (in office, on computer).   Outpatient Encounter Medications as of 05/04/2020  Medication Sig Note  . aspirin-acetaminophen-caffeine (EXCEDRIN MIGRAINE) 250-250-65 MG tablet Take 1 tablet by mouth every 6 (six) hours as needed for headache. 05/04/2020: Last dose 5pm last night  . Loratadine-Pseudoephedrine (CLARITIN-D 24 HOUR PO) Take 1 tablet by mouth daily. 05/04/2020: Plain claritin, not D.  . losartan (COZAAR) 25 MG tablet TAKE 1 TABLET(25 MG) BY MOUTH DAILY (Patient taking differently: Take 25 mg by mouth daily.)   . metoprolol succinate (TOPROL-XL) 50 MG 24 hr tablet Take 1 tablet (50 mg total) by mouth daily.   Marland Kitchen Propylene Glycol (SYSTANE COMPLETE OP) Place 1-2 drops into both eyes See admin instructions. Place 1-2 drops into each eye prior to putting in contacts daily   . sildenafil (VIAGRA) 100 MG tablet 1/2-1 tablet per 24 hour period for ED (Patient not taking: Reported on 05/04/2020)    No facility-administered encounter medications on file as of 05/04/2020.   No Known Allergies  ROS:  no fever, chills, URI or allergy symptoms.  +HA, CP, upper back pain, intermittent numbness (had some in mouth while in car today with episode, but also reports some intermittent numbness of L hand).  No GI or GU complaints, rashes, bleeding, bruising.    PHYSICAL EXAM:  BP 126/86   Pulse 79   Temp 97.7 F (36.5 C) (Oral)   Ht 5\' 5"  (1.651 m)   Wt 216 lb (98 kg)   BMI 35.94 kg/m  BP above was with his home  monitor.  In office: 150/110 laying, 128/100 sitting, 120/106 standing. Pulse 76/88/80 114/90 on repeat by MD, seated  Well-appearing, pleasant male, slightly anxious-appearing HEENT: conjunctiva and sclera are clear, EOMI. Normal nasal mucosa. OP clear.  Sinuses are nontender.   Tender at temporalis muscles and extending posteriorly across the skull Tender at base of skull, posterior, at mastoids, and extending a little into the neck. Tender at upper medial scapulae/upper rhomboid muscles bilaterally Back: Spine nontender, no CVA tenderness Chest: Tender at costochondral junctions--2 levels on the left, one on the right.  Heart: regular rate and rhythm Lungs: clear bilaterally, normal air movement Abdomen: Soft, nontender, no masses Extremities: no edema Neuro: alert and oriented. Cranial nerves grossly intact. Normal gait. Psych: normal mood, affect (slightly anxious), hygiene and grooming.   Assessment and Plan:  Tension headache - band-like HA, with reproducible tenderness.  Trial NSAID, reassured - Plan: naproxen (NAPROSYN) 500 MG tablet  Chest wall pain - costochondritis.  Warm compresses and NSAID  Reassured - Plan: naproxen (NAPROSYN) 500 MG tablet  Essential hypertension - elevated diastolic, narrow pulse pressure.  Recent echo ok. Anxiety may be contributing. Cont to monitor BP, f/u if remains elevated  Upper back pain - muscular.  Recommended heat, massage, shown stretches.  NSAIDs should help, discussed posture.   Declined toradol injection, preferred rx oral NSAID.  Low pulse pressure Had echo 10/2019--normal EF, normal valves    Take naproxen twice daily with food, starting with dinner. Take it until your pain has resolved completely. If you take the full 15 days and still have pain, you should follow up with your regular provider.  Do NOT take any ibuprofen/advil/motrin/aleve/Goody/BC or Excedrin while taking the naproxen. You MAY take Tylenol along with it, if  needed for pain.  Warm compresses should help with the chest wall pain and headaches (though you can also try ice on the temples/head, use whichever feels better).  We discussed proper posture for the neck--be sure to keep a neutral neck position rather than looking down.  We showed you stretches for the muscles by the shoulder blades.  Massaging this area may also help.

## 2020-05-16 ENCOUNTER — Other Ambulatory Visit: Payer: Self-pay | Admitting: Family Medicine

## 2020-05-16 DIAGNOSIS — R0789 Other chest pain: Secondary | ICD-10-CM

## 2020-05-16 DIAGNOSIS — G44209 Tension-type headache, unspecified, not intractable: Secondary | ICD-10-CM

## 2020-08-17 ENCOUNTER — Other Ambulatory Visit: Payer: Self-pay

## 2020-08-17 ENCOUNTER — Other Ambulatory Visit (INDEPENDENT_AMBULATORY_CARE_PROVIDER_SITE_OTHER): Payer: Commercial Managed Care - PPO

## 2020-08-17 DIAGNOSIS — Z23 Encounter for immunization: Secondary | ICD-10-CM | POA: Diagnosis not present

## 2020-10-22 ENCOUNTER — Other Ambulatory Visit: Payer: Self-pay

## 2020-10-22 MED ORDER — LOSARTAN POTASSIUM 25 MG PO TABS: 25.0000 mg | ORAL_TABLET | Freq: Every day | ORAL | 0 refills | Status: DC

## 2021-01-18 ENCOUNTER — Other Ambulatory Visit: Payer: Self-pay | Admitting: Cardiovascular Disease

## 2021-02-19 ENCOUNTER — Other Ambulatory Visit: Payer: Self-pay

## 2021-02-19 ENCOUNTER — Ambulatory Visit (INDEPENDENT_AMBULATORY_CARE_PROVIDER_SITE_OTHER): Payer: Commercial Managed Care - PPO | Admitting: Medical

## 2021-02-19 VITALS — BP 110/80 | HR 70 | Ht 65.0 in | Wt 223.0 lb

## 2021-02-19 DIAGNOSIS — G4733 Obstructive sleep apnea (adult) (pediatric): Secondary | ICD-10-CM

## 2021-02-19 DIAGNOSIS — K921 Melena: Secondary | ICD-10-CM

## 2021-02-19 DIAGNOSIS — R140 Abdominal distension (gaseous): Secondary | ICD-10-CM

## 2021-02-19 DIAGNOSIS — Z13220 Encounter for screening for lipoid disorders: Secondary | ICD-10-CM

## 2021-02-19 DIAGNOSIS — I5042 Chronic combined systolic (congestive) and diastolic (congestive) heart failure: Secondary | ICD-10-CM | POA: Diagnosis not present

## 2021-02-19 DIAGNOSIS — N529 Male erectile dysfunction, unspecified: Secondary | ICD-10-CM

## 2021-02-19 DIAGNOSIS — I10 Essential (primary) hypertension: Secondary | ICD-10-CM

## 2021-02-19 DIAGNOSIS — K760 Fatty (change of) liver, not elsewhere classified: Secondary | ICD-10-CM

## 2021-02-19 DIAGNOSIS — Z0000 Encounter for general adult medical examination without abnormal findings: Secondary | ICD-10-CM | POA: Diagnosis not present

## 2021-02-19 DIAGNOSIS — Z131 Encounter for screening for diabetes mellitus: Secondary | ICD-10-CM

## 2021-02-19 DIAGNOSIS — R7301 Impaired fasting glucose: Secondary | ICD-10-CM

## 2021-02-19 DIAGNOSIS — R7989 Other specified abnormal findings of blood chemistry: Secondary | ICD-10-CM

## 2021-02-19 DIAGNOSIS — R6882 Decreased libido: Secondary | ICD-10-CM

## 2021-02-19 DIAGNOSIS — Z9049 Acquired absence of other specified parts of digestive tract: Secondary | ICD-10-CM

## 2021-02-19 DIAGNOSIS — Z7185 Encounter for immunization safety counseling: Secondary | ICD-10-CM

## 2021-02-19 DIAGNOSIS — Z9989 Dependence on other enabling machines and devices: Secondary | ICD-10-CM

## 2021-02-19 DIAGNOSIS — R5383 Other fatigue: Secondary | ICD-10-CM

## 2021-02-19 MED ORDER — TADALAFIL 20 MG PO TABS: 10.0000 mg | ORAL_TABLET | ORAL | 3 refills | Status: DC | PRN

## 2021-02-19 MED ORDER — POLYETHYLENE GLYCOL 3350 17 GM/SCOOP PO POWD: 17.0000 g | Freq: Every day | ORAL | 0 refills | Status: DC

## 2021-02-19 NOTE — Patient Instructions (Signed)
This visit was a preventative care visit, also known as wellness visit or routine physical.   Topics typically include healthy lifestyle, diet, exercise, preventative care, vaccinations, sick and well care, proper use of emergency dept and after hours care, as well as other concerns.     Recommendations: Continue to return yearly for your annual wellness and preventative care visits.  This gives [place] a chance to discuss healthy lifestyle, exercise, vaccinations, review your chart record, and perform screenings where appropriate.  I recommend you see your eye doctor yearly for routine vision care.  I recommend you see your dentist yearly for routine dental care including hygiene visits twice yearly.   Vaccination recommendations were reviewed Immunization History  Administered Date(s) Administered   Hepatitis A, Adult 02/17/2020, 08/17/2020   Hepb-cpg 02/17/2020, 04/16/2020   Influenza,inj,Quad PF,6+ Mos 11/03/2016, 11/06/2017, 12/11/2018   Tdap 02/17/2020    I recommend pneumonia vaccine.  Declines pneumococcal 23 vaccine. I recommend covid vaccine.  Declines. I recommend yearly flu vaccine.  Declines.    Screening for cancer: Colon cancer screening: Age 60, however, given recent concerns, referral to gastroenterology  Testicular cancer screening You should do a monthly self testicular exam if you are between 69-34 years old  We discussed PSA, prostate exam, and prostate cancer screening risks/benefits.   Age 3  Skin cancer screening: Check your skin regularly for new changes, growing lesions, or other lesions of concern Come in for evaluation if you have skin lesions of concern.  Lung cancer screening: If you have a greater than 20 pack year history of tobacco use, then you may qualify for lung cancer screening with a chest CT scan.   Please call your insurance company to inquire about coverage for this test.  We currently don't have screenings for other cancers besides  breast, cervical, colon, and lung cancers.  If you have a strong family history of cancer or have other cancer screening concerns, please let me know.    Bone health: Get at least 150 minutes of aerobic exercise weekly Get weight bearing exercise at least once weekly Bone density test:  A bone density test is an imaging test that uses a type of X-ray to measure the amount of calcium and other minerals in your bones. The test may be used to diagnose or screen you for a condition that causes weak or thin bones (osteoporosis), predict your risk for a broken bone (fracture), or determine how well your osteoporosis treatment is working. The bone density test is recommended for females 65 and older, or females or males <65 if certain risk factors such as thyroid disease, long term use of steroids such as for asthma or rheumatological issues, vitamin D deficiency, estrogen deficiency, family history of osteoporosis, self or family history of fragility fracture in first degree relative.    Heart health: Get at least 150 minutes of aerobic exercise weekly Limit alcohol It is important to maintain a healthy blood pressure and healthy cholesterol numbers  Heart disease screening: Screening for heart disease includes screening for blood pressure, fasting lipids, glucose/diabetes screening, BMI height to weight ratio, reviewed of smoking status, physical activity, and diet.    Goals include blood pressure 120/80 or less, maintaining a healthy lipid/cholesterol profile, preventing diabetes or keeping diabetes numbers under good control, not smoking or using tobacco products, exercising most days per week or at least 150 minutes per week of exercise, and eating healthy variety of fruits and vegetables, healthy oils, and avoiding unhealthy food choices like  fried food, fast food, high sugar and high cholesterol foods.     Medical care options: I recommend you continue to seek care here first for routine  care.  We try really hard to have available appointments Monday through Friday daytime hours for sick visits, acute visits, and physicals.  Urgent care should be used for after hours and weekends for significant issues that cannot wait till the next day.  The emergency department should be used for significant potentially life-threatening emergencies.  The emergency department is expensive, can often have long wait times for less significant concerns, so try to utilize primary care, urgent care, or telemedicine when possible to avoid unnecessary trips to the emergency department.  Virtual visits and telemedicine have been introduced since the pandemic started in 2020, and can be convenient ways to receive medical care.  We offer virtual appointments as well to assist you in a variety of options to seek medical care.    Separate significant issues discussed: Abdominal bloating, constipation, likely blood in stool I am referring you back to Dr. Vida Rigger In the meantime get good fiber and water in your diet, at least 80-100 ounces of water daily Begin trial of Miralax power, 1 cap full daily in a beverage to help with constipation and discomfort   Low testosterone prior, fatigue, low libido Additional labs today as he wants to possibly pursue therapy Discussed possible labs, evaluation and treatment   Erectile Dysfunction Bad headaches with Viagra.  Trial of cilias.  Discussed proper use of medication Begin trial of Cialis 1/2 to 1 tablet 30 minutes before sexual activity.  You cannot use any more than 1 tablet in a 24-hour period   Fatty liver disease, obesity Long-term complications can include liver function abnormalities, low platelet count, bleeding, scarring and ultimately liver failure This is treated with healthy diet, low-fat diet and losing weight   Hypertension-continue current medication   History of chronic systolic and diastolic heart failure-you are due back to see your  cardiologist at this time.  Your last echocardiogram looked pretty good.   Sleep apnea-continue CPAP   Impaired glucose, prediabetes Prediabetes means you have a higher than normal blood sugar level. It's not high enough to be considered type 2 diabetes yet, but without making some lifestyle changes you are more likely to develop type 2 diabetes.  If you have prediabetes, the long-term damage of diabetes, especially to your heart, blood vessels and kidneys may already be starting. You may not be able to change certain risk factors such as age, race, or family history, but you CAN make changes to your lifestyle, your eating habits, and your activity.  Although diabetes can develop at any age, the risk of prediabetes increases after age 43.  Your risk of prediabetes increases if you have a parent or sibling with type 2 diabetes.   Although it's unclear why, certain people including Black, Hispanic, American [place] and [place] American people, are more likely to develop prediabetes.  Ways to prevent or slow progression to diabetes: Eat healthy foods - Eating red meat and processed meat, and drinking sugar-sweetened beverages, is associated with a higher risk of prediabetes. A diet high in fruits, vegetables, nuts, whole grains and olive oil is associated with a lower risk of prediabetes. Get at least 150 minutes of moderate aerobic physical activity a week, or about 30 minutes on most days of the week.  The less active you are, the greater your risk of prediabetes. Physical activity helps you  control your weight, uses up sugar for energy and makes the body use insulin more effectively. Lose excess weight - Being overweight is a primary risk factor for prediabetes. The more fatty tissue you have, especially inside and between the muscle and skin around your abdomen, the more resistant your cells become to insulin. Waist size. A large waist size can indicate insulin resistance. The risk of insulin resistance  goes up for men with waists larger than 40 inches and for women with waists larger than 35 inches. Control your blood pressure and cholesterol.  If your blood pressure is not 130/80 or less, discuss with your provider to help get this under control.  It is ideal to have an HDL good cholesterol number >50 and have a LDL bad cholesterol number <100.   Don't smoke One simple strategy to help you make good food choices and eat appropriate portions sizes is to divide up your plate. These three divisions on your plate promote healthy eating:  One-half: fruit and nonstarchy vegetables One-quarter: whole grains One-quarter: protein-rich foods, such as legumes, fish or lean meats

## 2021-02-19 NOTE — Progress Notes (Signed)
Subjective:   HPI  Bobby Kelly is a 41 y.o. male who presents for Chief Complaint  Patient presents with   fasting cpe    Fasting cpe, sometime have a pain on right side since beginning of January- worse at night when laying down    Patient Care Team: Raine Blodgett, Cleda Mccreedy as PCP - General (Family Medicine) Chilton Si, MD as PCP - Cardiology (Cardiology) Sees dentist Dr. Chilton Si, cardiology Dr. Jethro Bolus, eye Dr. Vida Rigger, GI  Concerns: Been having a burning pain in right abdomen for a few weeks.    Tried some gas ex which helped a little but the pain persists.  No rash.  No redness.  Worse with lying down in general.   Eating doesn't necessarily worsen the pain.   Has felt some constipation, but then can have some bouts of loose stool alternating.  No urinary c/o.    No blood in urine.   At times sees reddish tint to stool, no frank blood.   Exercising - 4th week back to gym.  Going 4-5 days per week.  Has started back intermittent fasting which has helped before.   Eating less carbs.    HTN - compliant with medicaiton.   No recent dyspnea . Occasionally gets a chest discomfort.  Is due back to go see cardiology at this time.   Has had a chest wall pain on and off for years.  ED - uses Viagra some.  Had bad headache with it though.  Lately hasn't had to use it.    Reviewed their medical, surgical, family, social, medication, and allergy history and updated chart as appropriate.  Past Medical History:  Diagnosis Date   Allergy    CHF (congestive heart failure) (HCC) 2018   Cholelithiasis 2018   cholecystectomy with complication, drain    Dry skin    nasal folds   Hypertension    OSA (obstructive sleep apnea)    Thyroid disease    hyperactive in high school, had oral therapy.     Wears contact lenses     Past Surgical History:  Procedure Laterality Date   APPENDECTOMY     CHOLECYSTECTOMY N/A 12/03/2016   Procedure: LAPAROSCOPIC CHOLECYSTECTOMY;   Surgeon: Violeta Gelinas, MD;  Location: [facility] OR;  Service: General;  Laterality: N/A;   ERCP N/A 12/19/2016   Procedure: ENDOSCOPIC RETROGRADE CHOLANGIOPANCREATOGRAPHY (ERCP);  Surgeon: Vida Rigger, MD;  Location: [facility] ENDOSCOPY;  Service: Endoscopy;  Laterality: N/A;   ESOPHAGOGASTRODUODENOSCOPY (EGD) WITH PROPOFOL N/A 12/13/2016   Procedure: ESOPHAGOGASTRODUODENOSCOPY (EGD) WITH PROPOFOL;  Surgeon: Willis Modena, MD;  Location: [facility] ENDOSCOPY;  Service: Endoscopy;  Laterality: N/A;   ESOPHAGOGASTRODUODENOSCOPY (EGD) WITH PROPOFOL N/A 12/15/2016   Procedure: ESOPHAGOGASTRODUODENOSCOPY (EGD) WITH PROPOFOL;  Surgeon: Willis Modena, MD;  Location: [facility] ENDOSCOPY;  Service: Endoscopy;  Laterality: N/A;   ESOPHAGOGASTRODUODENOSCOPY (EGD) WITH PROPOFOL N/A 04/11/2017   Procedure: ESOPHAGOGASTRODUODENOSCOPY (EGD) WITH PROPOFOL;  Surgeon: Vida Rigger, MD;  Location: [facility] ENDOSCOPY;  Service: Endoscopy;  Laterality: N/A;  side viewing scope- biliary stent removal   IR CATHETER TUBE CHANGE  12/23/2016   IR RADIOLOGIST EVAL & MGMT  01/03/2017   IR RADIOLOGIST EVAL & MGMT  01/17/2017   IR RADIOLOGIST EVAL & MGMT  01/25/2017   IR RADIOLOGIST EVAL & MGMT  02/09/2017   IR RADIOLOGIST EVAL & MGMT  02/21/2017   IR THORACENTESIS ASP PLEURAL SPACE W/IMG GUIDE  12/23/2016    Family History  Problem Relation Age of Onset   Hypertension  Mother    Cataracts Mother    Hyperlipidemia Father    Diabetes Father    Pancreatic cancer Maternal Grandmother    Cancer Maternal Grandmother        pancreatic   Diabetes Other    Cancer Maternal Aunt        pancreatic   Heart disease Neg Hx    Stroke Neg Hx      Current Outpatient Medications:    aspirin-acetaminophen-caffeine (EXCEDRIN MIGRAINE) 250-250-65 MG tablet, Take 1 tablet by mouth every 6 (six) hours as needed for headache., Disp: , Rfl:    Loratadine-Pseudoephedrine (CLARITIN-D 24 HOUR PO), Take 1 tablet by mouth daily., Disp: , Rfl:    losartan (COZAAR) 25 MG  tablet, TAKE 1 TABLET(25 MG) BY MOUTH DAILY, Disp: 90 tablet, Rfl: 3   metoprolol succinate (TOPROL-XL) 50 MG 24 hr tablet, TAKE 1 TABLET(50 MG) BY MOUTH DAILY, Disp: 90 tablet, Rfl: 3   naproxen (NAPROSYN) 500 MG tablet, Take 1 tablet (500 mg total) by mouth 2 (two) times daily with a meal. Take until pain resolved, Disp: 30 tablet, Rfl: 0   polyethylene glycol powder (GLYCOLAX/MIRALAX) 17 GM/SCOOP powder, Take 17 g by mouth daily., Disp: 116 g, Rfl: 0   Propylene Glycol (SYSTANE COMPLETE OP), Place 1-2 drops into both eyes See admin instructions. Place 1-2 drops into each eye prior to putting in contacts daily, Disp: , Rfl:    sildenafil (VIAGRA) 100 MG tablet, 1/2-1 tablet per 24 hour period for ED, Disp: 10 tablet, Rfl: 1   tadalafil (CIALIS) 20 MG tablet, Take 0.5-1 tablets (10-20 mg total) by mouth every other day as needed for erectile dysfunction., Disp: 10 tablet, Rfl: 3  No Known Allergies   Review of Systems Constitutional: -fever, -chills, -sweats, -unexpected weight change, -decreased appetite, +fatigue Allergy: -sneezing, -itching, -congestion Dermatology: -changing moles, --rash, -lumps ENT: -runny nose, -ear pain, -sore throat, -hoarseness, -sinus pain, -teeth pain, - ringing in ears, -hearing loss, -nosebleeds Cardiology: -chest pain, -palpitations, -swelling, -difficulty breathing when lying flat, -waking up short of breath Respiratory: -cough, -shortness of breath, -difficulty breathing with exercise or exertion, -wheezing, -coughing up blood Gastroenterology: +abdominal pain, -nausea, -vomiting, -diarrhea, +constipation, +blood in stool, +changes in bowel movement, -difficulty swallowing or eating Hematology: -bleeding, -bruising  Musculoskeletal: -joint aches, -muscle aches, -joint swelling, -back pain, -neck pain, -cramping, -changes in gait Ophthalmology: denies vision changes, eye redness, itching, discharge Urology: -burning with urination, -difficulty urinating, -blood  in urine, -urinary frequency, -urgency, -incontinence Neurology: -headache, -weakness, -tingling, -numbness, -memory loss, -falls, -dizziness Psychology: -depressed mood, -agitation, -sleep problems Male GU: no testicular mass, pain, no lymph nodes swollen, no swelling, no rash.  Depression screen [facility] 2/9 02/19/2021 02/17/2020 04/04/2018 11/06/2017 11/03/2016  Decreased Interest 0 0 0 0 0  Down, Depressed, Hopeless 0 0 0 0 0  PHQ - 2 Score 0 0 0 0 0        Objective:  BP 110/80    Pulse 70    Ht  (1.651 m)    Wt 223 lb (101.2 kg)    BMI 37.11 kg/m   General appearance: alert, no distress, WD/WN, Hispanic male Skin: unremarkable HEENT: normocephalic, conjunctiva/corneas normal, sclerae anicteric, PERRLA, EOMi Neck: supple, no lymphadenopathy, no thyromegaly, no masses, normal ROM, no bruits Chest: non tender, normal shape and expansion Heart: RRR, normal S1, S2, no murmurs Lungs: CTA bilaterally, no wheezes, rhonchi, or rales Abdomen: +bs, soft, RUQ port scars, RLQ diagonal surgical scar, non tender, non distended, no  masses, no hepatomegaly, no splenomegaly, no bruits Back: non tender, normal ROM, no scoliosis Musculoskeletal: upper extremities non tender, no obvious deformity, normal ROM throughout, lower extremities non tender, no obvious deformity, normal ROM throughout Extremities: no edema, no cyanosis, no clubbing Pulses: 2+ symmetric, upper and lower extremities, normal cap refill Neurological: alert, oriented x 3, CN2-12 intact, strength normal upper extremities and lower extremities, sensation normal throughout, DTRs 2+ throughout, no cerebellar signs, gait normal Psychiatric: normal affect, behavior normal, pleasant  GU: normal male external genitalia,uncircumcised, nontender, no masses, no hernia, no lymphadenopathy Rectal: deferred   Assessment and Plan :   Encounter Diagnoses  Name Primary?   Routine general medical examination at a health care facility Yes    Chronic combined systolic and diastolic heart failure (HCC)    Essential hypertension    OSA on CPAP    Erectile dysfunction, unspecified erectile dysfunction type    Fatty liver    Impaired fasting blood sugar    Low testosterone    S/P cholecystectomy    Vaccine counseling    Other fatigue    Low libido    Screening for lipid disorders    Screening for diabetes mellitus    Abdominal bloating    Blood in stool     This visit was a preventative care visit, also known as wellness visit or routine physical.   Topics typically include healthy lifestyle, diet, exercise, preventative care, vaccinations, sick and well care, proper use of emergency dept and after hours care, as well as other concerns.     Recommendations: Continue to return yearly for your annual wellness and preventative care visits.  This gives [place] a chance to discuss healthy lifestyle, exercise, vaccinations, review your chart record, and perform screenings where appropriate.  I recommend you see your eye doctor yearly for routine vision care.  I recommend you see your dentist yearly for routine dental care including hygiene visits twice yearly.   Vaccination recommendations were reviewed Immunization History  Administered Date(s) Administered   Hepatitis A, Adult 02/17/2020, 08/17/2020   Hepb-cpg 02/17/2020, 04/16/2020   Influenza,inj,Quad PF,6+ Mos 11/03/2016, 11/06/2017, 12/11/2018   Tdap 02/17/2020    I recommend pneumonia vaccine.  Declines pneumococcal 23 vaccine. I recommend covid vaccine.  Declines. I recommend yearly flu vaccine.  Declines.    Screening for cancer: Colon cancer screening: Age 58, however, given recent concerns, referral to gastroenterology  Testicular cancer screening You should do a monthly self testicular exam if you are between 2-62 years old  We discussed PSA, prostate exam, and prostate cancer screening risks/benefits.   Age 73  Skin cancer screening: Check your skin  regularly for new changes, growing lesions, or other lesions of concern Come in for evaluation if you have skin lesions of concern.  Lung cancer screening: If you have a greater than 20 pack year history of tobacco use, then you may qualify for lung cancer screening with a chest CT scan.   Please call your insurance company to inquire about coverage for this test.  We currently don't have screenings for other cancers besides breast, cervical, colon, and lung cancers.  If you have a strong family history of cancer or have other cancer screening concerns, please let me know.    Bone health: Get at least 150 minutes of aerobic exercise weekly Get weight bearing exercise at least once weekly Bone density test:  A bone density test is an imaging test that uses a type of X-ray to measure the amount  of calcium and other minerals in your bones. The test may be used to diagnose or screen you for a condition that causes weak or thin bones (osteoporosis), predict your risk for a broken bone (fracture), or determine how well your osteoporosis treatment is working. The bone density test is recommended for females 65 and older, or females or males <65 if certain risk factors such as thyroid disease, long term use of steroids such as for asthma or rheumatological issues, vitamin D deficiency, estrogen deficiency, family history of osteoporosis, self or family history of fragility fracture in first degree relative.    Heart health: Get at least 150 minutes of aerobic exercise weekly Limit alcohol It is important to maintain a healthy blood pressure and healthy cholesterol numbers  Heart disease screening: Screening for heart disease includes screening for blood pressure, fasting lipids, glucose/diabetes screening, BMI height to weight ratio, reviewed of smoking status, physical activity, and diet.    Goals include blood pressure 120/80 or less, maintaining a healthy lipid/cholesterol profile, preventing  diabetes or keeping diabetes numbers under good control, not smoking or using tobacco products, exercising most days per week or at least 150 minutes per week of exercise, and eating healthy variety of fruits and vegetables, healthy oils, and avoiding unhealthy food choices like fried food, fast food, high sugar and high cholesterol foods.     Medical care options: I recommend you continue to seek care here first for routine care.  We try really hard to have available appointments Monday through Friday daytime hours for sick visits, acute visits, and physicals.  Urgent care should be used for after hours and weekends for significant issues that cannot wait till the next day.  The emergency department should be used for significant potentially life-threatening emergencies.  The emergency department is expensive, can often have long wait times for less significant concerns, so try to utilize primary care, urgent care, or telemedicine when possible to avoid unnecessary trips to the emergency department.  Virtual visits and telemedicine have been introduced since the pandemic started in 2020, and can be convenient ways to receive medical care.  We offer virtual appointments as well to assist you in a variety of options to seek medical care.    Separate significant issues discussed: Abdominal bloating, constipation, likely blood in stool I am referring you back to Dr. Vida Rigger In the meantime get good fiber and water in your diet, at least 80-100 ounces of water daily Begin trial of Miralax power, 1 cap full daily in a beverage to help with constipation and discomfort   Low testosterone prior, fatigue, low libido Additional labs today as he wants to possibly pursue therapy Discussed possible labs, evaluation and treatment   Erectile Dysfunction Bad headaches with Viagra.  Trial of cilias.  Discussed proper use of medication Begin trial of Cialis 1/2 to 1 tablet 30 minutes before sexual activity.   You cannot use any more than 1 tablet in a 24-hour period   Fatty liver disease, obesity Long-term complications can include liver function abnormalities, low platelet count, bleeding, scarring and ultimately liver failure This is treated with healthy diet, low-fat diet and losing weight   Hypertension-continue current medication   History of chronic systolic and diastolic heart failure-you are due back to see your cardiologist at this time.  Your last echocardiogram looked pretty good.   Sleep apnea-continue CPAP   Impaired glucose, prediabetes Prediabetes means you have a higher than normal blood sugar level. It's not high enough  to be considered type 2 diabetes yet, but without making some lifestyle changes you are more likely to develop type 2 diabetes.  If you have prediabetes, the long-term damage of diabetes, especially to your heart, blood vessels and kidneys may already be starting. You may not be able to change certain risk factors such as age, race, or family history, but you CAN make changes to your lifestyle, your eating habits, and your activity.  Although diabetes can develop at any age, the risk of prediabetes increases after age 63.  Your risk of prediabetes increases if you have a parent or sibling with type 2 diabetes.   Although it's unclear why, certain people including Black, Hispanic, American [place] and [place] American people, are more likely to develop prediabetes.  Ways to prevent or slow progression to diabetes: Eat healthy foods - Eating red meat and processed meat, and drinking sugar-sweetened beverages, is associated with a higher risk of prediabetes. A diet high in fruits, vegetables, nuts, whole grains and olive oil is associated with a lower risk of prediabetes. Get at least 150 minutes of moderate aerobic physical activity a week, or about 30 minutes on most days of the week.  The less active you are, the greater your risk of prediabetes. Physical activity helps  you control your weight, uses up sugar for energy and makes the body use insulin more effectively. Lose excess weight - Being overweight is a primary risk factor for prediabetes. The more fatty tissue you have, especially inside and between the muscle and skin around your abdomen, the more resistant your cells become to insulin. Waist size. A large waist size can indicate insulin resistance. The risk of insulin resistance goes up for men with waists larger than 40 inches and for women with waists larger than 35 inches. Control your blood pressure and cholesterol.  If your blood pressure is not 130/80 or less, discuss with your provider to help get this under control.  It is ideal to have an HDL good cholesterol number >50 and have a LDL bad cholesterol number <100.   Don't smoke One simple strategy to help you make good food choices and eat appropriate portions sizes is to divide up your plate. These three divisions on your plate promote healthy eating:  One-half: fruit and nonstarchy vegetables One-quarter: whole grains One-quarter: protein-rich foods, such as legumes, fish or lean meats    Amond was seen today for fasting cpe.  Diagnoses and all orders for this visit:  Routine general medical examination at a health care facility -     Comprehensive metabolic panel -     CBC -     Lipid panel -     Urinalysis -     Hemoglobin A1c -     TSH + free T4 -     Insulin, random -     Testosterone -     FSH/LH -     Prolactin  Chronic combined systolic and diastolic heart failure (HCC)  Essential hypertension -     Comprehensive metabolic panel  OSA on CPAP  Erectile dysfunction, unspecified erectile dysfunction type  Fatty liver -     Comprehensive metabolic panel -     Lipid panel  Impaired fasting blood sugar -     Hemoglobin A1c  Low testosterone -     TSH + free T4 -     Testosterone -     FSH/LH -     Prolactin  S/P cholecystectomy  Vaccine  counseling  Other  fatigue  Low libido -     Testosterone -     FSH/LH -     Prolactin  Screening for lipid disorders -     Lipid panel  Screening for diabetes mellitus -     Hemoglobin A1c  Abdominal bloating -     Ambulatory referral to Gastroenterology  Blood in stool -     Ambulatory referral to Gastroenterology  Other orders -     polyethylene glycol powder (GLYCOLAX/MIRALAX) 17 GM/SCOOP powder; Take 17 g by mouth daily. -     tadalafil (CIALIS) 20 MG tablet; Take 0.5-1 tablets (10-20 mg total) by mouth every other day as needed for erectile dysfunction.    Follow-up pending labs, yearly for physical

## 2021-02-20 LAB — LIPID PANEL
Chol/HDL Ratio: 2.9 ratio (ref 0.0–5.0)
Cholesterol, Total: 167 mg/dL (ref 100–199)
HDL: 58 mg/dL (ref >39)
LDL Chol Calc (NIH): 92 mg/dL (ref 0–99)
Triglycerides: 92 mg/dL (ref 0–149)
VLDL Cholesterol Cal: 17 mg/dL (ref 5–40)

## 2021-02-20 LAB — CBC
Hematocrit: 52.3 % — ABNORMAL HIGH (ref 37.5–51.0)
Hemoglobin: 17.9 g/dL — ABNORMAL HIGH (ref 13.0–17.7)
MCH: 31.2 pg (ref 26.6–33.0)
MCHC: 34.2 g/dL (ref 31.5–35.7)
MCV: 91 fL (ref 79–97)
Platelets: 281 10*3/uL (ref 150–450)
RBC: 5.74 x10E6/uL (ref 4.14–5.80)
RDW: 12.8 % (ref 11.6–15.4)
WBC: 8.6 10*3/uL (ref 3.4–10.8)

## 2021-02-20 LAB — TSH+FREE T4
Free T4: 1.3 ng/dL (ref 0.82–1.77)
TSH: 1.8 u[IU]/mL (ref 0.450–4.500)

## 2021-02-20 LAB — COMPREHENSIVE METABOLIC PANEL
ALT: 55 IU/L — ABNORMAL HIGH (ref 0–44)
AST: 38 IU/L (ref 0–40)
Albumin/Globulin Ratio: 1.7 (ref 1.2–2.2)
Albumin: 4.7 g/dL (ref 4.0–5.0)
Alkaline Phosphatase: 80 IU/L (ref 44–121)
BUN/Creatinine Ratio: 20 (ref 9–20)
BUN: 20 mg/dL (ref 6–20)
Bilirubin Total: 0.5 mg/dL (ref 0.0–1.2)
CO2: 24 mmol/L (ref 20–29)
Calcium: 9.4 mg/dL (ref 8.7–10.2)
Chloride: 102 mmol/L (ref 96–106)
Creatinine, Ser: 0.98 mg/dL (ref 0.76–1.27)
Globulin, Total: 2.7 g/dL (ref 1.5–4.5)
Glucose: 88 mg/dL (ref 70–99)
Potassium: 4.2 mmol/L (ref 3.5–5.2)
Sodium: 140 mmol/L (ref 134–144)
Total Protein: 7.4 g/dL (ref 6.0–8.5)
eGFR: 101 mL/min/{1.73_m2} (ref >59)

## 2021-02-20 LAB — FSH/LH
FSH: 3.1 m[IU]/mL (ref 1.5–12.4)
LH: 5.7 m[IU]/mL (ref 1.7–8.6)

## 2021-02-20 LAB — TESTOSTERONE: Testosterone: 275 ng/dL (ref 264–916)

## 2021-02-20 LAB — URINALYSIS
Bilirubin, UA: NEGATIVE
Glucose, UA: NEGATIVE
Ketones, UA: NEGATIVE
Leukocytes,UA: NEGATIVE
Nitrite, UA: NEGATIVE
RBC, UA: NEGATIVE
Specific Gravity, UA: >=1.03 — AB (ref 1.005–1.030)
Urobilinogen, Ur: 1 mg/dL (ref 0.2–1.0)
pH, UA: 7 (ref 5.0–7.5)

## 2021-02-20 LAB — PROLACTIN: Prolactin: 8.4 ng/mL (ref 4.0–15.2)

## 2021-02-20 LAB — HEMOGLOBIN A1C
Est. average glucose Bld gHb Est-mCnc: 114 mg/dL
Hgb A1c MFr Bld: 5.6 % (ref 4.8–5.6)

## 2021-02-20 LAB — INSULIN, RANDOM: INSULIN: 22.9 u[IU]/mL (ref 2.6–24.9)

## 2021-02-25 ENCOUNTER — Ambulatory Visit (INDEPENDENT_AMBULATORY_CARE_PROVIDER_SITE_OTHER): Payer: Commercial Managed Care - PPO | Admitting: Medical

## 2021-02-25 ENCOUNTER — Other Ambulatory Visit: Payer: Self-pay

## 2021-02-25 VITALS — BP 120/80 | HR 71 | Wt 224.0 lb

## 2021-02-25 DIAGNOSIS — G4733 Obstructive sleep apnea (adult) (pediatric): Secondary | ICD-10-CM | POA: Diagnosis not present

## 2021-02-25 DIAGNOSIS — R7989 Other specified abnormal findings of blood chemistry: Secondary | ICD-10-CM

## 2021-02-25 DIAGNOSIS — R7301 Impaired fasting glucose: Secondary | ICD-10-CM

## 2021-02-25 DIAGNOSIS — I5042 Chronic combined systolic (congestive) and diastolic (congestive) heart failure: Secondary | ICD-10-CM

## 2021-02-25 DIAGNOSIS — I10 Essential (primary) hypertension: Secondary | ICD-10-CM | POA: Diagnosis not present

## 2021-02-25 DIAGNOSIS — Z6837 Body mass index (BMI) 37.0-37.9, adult: Secondary | ICD-10-CM | POA: Insufficient documentation

## 2021-02-25 DIAGNOSIS — R718 Other abnormality of red blood cells: Secondary | ICD-10-CM | POA: Insufficient documentation

## 2021-02-25 DIAGNOSIS — Z9989 Dependence on other enabling machines and devices: Secondary | ICD-10-CM

## 2021-02-25 DIAGNOSIS — R6882 Decreased libido: Secondary | ICD-10-CM

## 2021-02-25 DIAGNOSIS — E8881 Metabolic syndrome: Secondary | ICD-10-CM | POA: Insufficient documentation

## 2021-02-25 DIAGNOSIS — K760 Fatty (change of) liver, not elsewhere classified: Secondary | ICD-10-CM

## 2021-02-25 DIAGNOSIS — R5383 Other fatigue: Secondary | ICD-10-CM

## 2021-02-25 NOTE — Progress Notes (Signed)
Subjective:  Bobby Kelly is a 41 y.o. male who presents for Chief Complaint  Patient presents with   discuss labs     Here my request to discuss abnormal lab findings from his recent physical last week  Just recently started back exercising and working on diet changes to help lose weight.  Last visit he did report fatigue and low libido.  He had low testosterone in the past and we just repeated this recently.  He is a non-smoker.  No prior nutrition consult  No other aggravating or relieving factors.    No other c/o.  Past Medical History:  Diagnosis Date   Allergy    CHF (congestive heart failure) (HCC) 2018   Cholelithiasis 2018   cholecystectomy with complication, drain    Dry skin    nasal folds   Hypertension    OSA (obstructive sleep apnea)    Thyroid disease    hyperactive in high school, had oral therapy.     Wears contact lenses    Current Outpatient Medications on File Prior to Visit  Medication Sig Dispense Refill   aspirin-acetaminophen-caffeine (EXCEDRIN MIGRAINE) 250-250-65 MG tablet Take 1 tablet by mouth every 6 (six) hours as needed for headache.     Loratadine-Pseudoephedrine (CLARITIN-D 24 HOUR PO) Take 1 tablet by mouth daily.     losartan (COZAAR) 25 MG tablet TAKE 1 TABLET(25 MG) BY MOUTH DAILY 90 tablet 3   metoprolol succinate (TOPROL-XL) 50 MG 24 hr tablet TAKE 1 TABLET(50 MG) BY MOUTH DAILY 90 tablet 3   naproxen (NAPROSYN) 500 MG tablet Take 1 tablet (500 mg total) by mouth 2 (two) times daily with a meal. Take until pain resolved 30 tablet 0   polyethylene glycol powder (GLYCOLAX/MIRALAX) 17 GM/SCOOP powder Take 17 g by mouth daily. 116 g 0   Propylene Glycol (SYSTANE COMPLETE OP) Place 1-2 drops into both eyes See admin instructions. Place 1-2 drops into each eye prior to putting in contacts daily     sildenafil (VIAGRA) 100 MG tablet 1/2-1 tablet per 24 hour period for ED 10 tablet 1   tadalafil (CIALIS) 20 MG tablet Take 0.5-1 tablets (10-20  mg total) by mouth every other day as needed for erectile dysfunction. 10 tablet 3   No current facility-administered medications on file prior to visit.     The following portions of the patient's history were reviewed and updated as appropriate: allergies, current medications, past family history, past medical history, past social history, past surgical history and problem list.  ROS Otherwise as in subjective above     Objective: BP 120/80    Pulse 71    Wt 224 lb (101.6 kg)    BMI 37.28 kg/m   General appearance: alert, no distress, well developed, well nourished Psych: pleasant , good eye contact, answers questions appropriately    Assessment: Encounter Diagnoses  Name Primary?   Low testosterone Yes   OSA on CPAP    Chronic combined systolic and diastolic heart failure (HCC)    Essential hypertension    Fatty liver    Impaired fasting blood sugar    Low libido    Other fatigue    Elevated red blood cell count    Elevated LFTs    Insulin resistance    BMI 37.0-37.9, adult      Plan: We spent a lot of time today discussing his recent lab abnormalities  Low testosterone, low libido, fatigue-we discussed the recent lab findings.  He does desire to  have a child at some point.  I will reach out to endocrinology through [facility] consult on some options and neck steps.  We discussed typical testosterone medications, risk and benefits and proper use of some of those medications.  We discussed the need for routine surveillance and testing.  We discussed that losing weight with healthy diet and exercise can also improve testosterone.  He is also on CPAP therapy and sleep apnea plays a role with his TST levels.   however in light of his concern for fertility, I will check with endocrinology before proceeding  Sleep apnea-continue CPAP therapy  Chronic heart failure, hypertension-continue current medication, continue to follow-up with cardiology  Fatty liver disease-prior  labs for hepatitis screening and iron testing normal within the past 2 years.  We discussed his ultrasound findings in 2001 showing fatty liver disease.  Discussed he needs to be more aggressive on losing weight with healthy low-fat low sugar diet and exercise.  His weights have been fairly stable and too high of a BMI for several years now  Impaired glucose, insulin resistance-discussed his recent lab findings.  We discussed possibly considering medication like Wegovy to help stabilize sugars and help with weight loss versus a medication like metformin.  Advised nutrition consult.  Recent elevated red blood cell count and concentrated urine likely due to dehydration when he was fasting for labs.  Plan to repeat labs within the next month fasting early morning visit  Obesity-we discussed his recent change in lifestyle to work on exercise and healthy diet.  I encouraged him to consider a medicine like Wegovy or metformin which could help her weight loss or his insulin resistance.   We discussed other medications to help her weight loss.  He was interested in appetite suppressant like Qsymia.  I will consult with his cardiologist about options for therapy.     Bobby Kelly was seen today for discuss labs.  Diagnoses and all orders for this visit:  Low testosterone  OSA on CPAP  Chronic combined systolic and diastolic heart failure (HCC)  Essential hypertension  Fatty liver  Impaired fasting blood sugar  Low libido  Other fatigue  Elevated red blood cell count  Elevated LFTs  Insulin resistance  BMI 37.0-37.9, adult    Follow up: pending call back

## 2021-02-26 NOTE — Addendum Note (Signed)
Addended by: Herminio Commons A on: 02/26/2021 07:49 AM   Modules accepted: Orders

## 2021-03-01 ENCOUNTER — Telehealth: Payer: Self-pay | Admitting: Medical

## 2021-03-01 NOTE — Telephone Encounter (Signed)
I spoke to his cardiologist.  She did not feel the Qsymia or stimulant type medication was a good idea.  She did agree with [facility] recommendation that can help with weight loss, helps curb appetite, helps feel fuller quicker when eating, and helps stabilize blood sugars.

## 2021-03-02 NOTE — Telephone Encounter (Signed)
So see if agreeable to [facility]

## 2021-03-02 NOTE — Telephone Encounter (Signed)
Pt wants to decide on this and he will let us know. He is not sure he can do a shot

## 2021-03-03 NOTE — Telephone Encounter (Signed)
Pt will think about it. Hes not sure he can do a needle.

## 2021-03-04 ENCOUNTER — Telehealth: Payer: Self-pay | Admitting: Medical

## 2021-03-04 NOTE — Telephone Encounter (Signed)
I spoke to an endocrinologist and got a lot more information on testosterone therapy, evaluation and possible treatments.  Since he is still at an age where he may want to have children he gets a little bit more complicated.  To the treatments that are used in this situation are not available through [place], they would be available through endocrinology or urology.  Thus if agreeable to move forward, I will refer to urology for further consult.  In situations where a male still may have children and may need to be on testosterone therapy other testing including sperm count would need to be done as well as a few other specific test  The endocrinologist I thought to strongly recommended though targeting weight loss.  If someone loses weight, often we will see an improvement in testosterone levels.  So overall I think the safest step is to work hard on losing weight over the next few months particularly since his testosterone levels were not extremely low

## 2021-03-04 NOTE — Telephone Encounter (Signed)
Left message for pt to call me back 

## 2021-03-05 NOTE — Telephone Encounter (Signed)
Pt wants to work on losing weight before going to specialist he will come back in 6 months to recheck and go from there

## 2021-03-10 ENCOUNTER — Encounter (HOSPITAL_BASED_OUTPATIENT_CLINIC_OR_DEPARTMENT_OTHER): Payer: Self-pay | Admitting: Cardiovascular Disease

## 2021-03-10 ENCOUNTER — Ambulatory Visit (INDEPENDENT_AMBULATORY_CARE_PROVIDER_SITE_OTHER): Payer: Commercial Managed Care - PPO | Admitting: Cardiovascular Disease

## 2021-03-10 ENCOUNTER — Other Ambulatory Visit: Payer: Self-pay

## 2021-03-10 VITALS — BP 110/70 | HR 72 | Ht 65.0 in | Wt 228.2 lb

## 2021-03-10 DIAGNOSIS — I10 Essential (primary) hypertension: Secondary | ICD-10-CM

## 2021-03-10 DIAGNOSIS — I428 Other cardiomyopathies: Secondary | ICD-10-CM

## 2021-03-10 DIAGNOSIS — G4733 Obstructive sleep apnea (adult) (pediatric): Secondary | ICD-10-CM | POA: Diagnosis not present

## 2021-03-10 DIAGNOSIS — I5042 Chronic combined systolic (congestive) and diastolic (congestive) heart failure: Secondary | ICD-10-CM

## 2021-03-10 DIAGNOSIS — Z9989 Dependence on other enabling machines and devices: Secondary | ICD-10-CM | POA: Diagnosis not present

## 2021-03-10 MED ORDER — METOPROLOL SUCCINATE ER 25 MG PO TB24: 25.0000 mg | ORAL_TABLET | Freq: Every day | ORAL | 3 refills | Status: DC

## 2021-03-10 NOTE — Assessment & Plan Note (Signed)
BP is well-controlled.  Continue losartan and we will reduce metoprolol to 25mg  daily.  He will continue to monitor BP and HR.

## 2021-03-10 NOTE — Assessment & Plan Note (Signed)
He is working on weight loss.  Continue with exercise and we discussed nutritional recommendations to reduce carbohydrate intake and increase protein.  Recommend lean proteins.

## 2021-03-10 NOTE — Assessment & Plan Note (Addendum)
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  Systolic function has recovered and heart rates are well-controlled.  He is interested in being on fewer medications.  We will reduce metoprolol to 25mg .  He will continue to monitor his heart rate. He is euvolemic and doing well.  Continue losartan and repeat echo 10/2021, which will be 2 years from prior study.

## 2021-03-10 NOTE — Assessment & Plan Note (Signed)
Continue CPAP.  

## 2021-03-10 NOTE — Patient Instructions (Addendum)
Medication Instructions:  DECREASE YOUR METOPROLOL TO 25 MG DAILY   *If you need a refill on your cardiac medications before your next appointment, please call your pharmacy*  Lab Work: NONE  Testing/Procedures: Your physician has requested that you have an echocardiogram. Echocardiography is a painless test that uses sound waves to create images of your heart. It provides your doctor with information about the size and shape of your heart and how well your hearts chambers and valves are working. This procedure takes approximately one hour. There are no restrictions for this procedure.  TO BE DONE IN September   Follow-Up: At [facility], you and your health needs are our priority.  As part of our continuing mission to provide you with exceptional heart care, we have created designated Provider Care Teams.  These Care Teams include your primary Cardiologist (physician) and Advanced Practice Providers (APPs -  Physician Assistants and Nurse Practitioners) who all work together to provide you with the care you need, when you need it.  We recommend signing up for the patient portal called "MyChart".  Sign up information is provided on this After Visit Summary.  MyChart is used to connect with patients for Virtual Visits (Telemedicine).  Patients are able to view lab/test results, encounter notes, upcoming appointments, etc.  Non-urgent messages can be sent to your provider as well.   To learn more about what you can do with MyChart, go to [email or web address].    Your next appointment:   7 month(s) AFTER ECHO   The format for your next appointment:   In Person  Provider:   Chilton Si, MD

## 2021-03-10 NOTE — Progress Notes (Signed)
Cardiology Office Note   Date:  03/10/2021   ID:  Bobby Kelly, DOB 18-Aug-1980, MRN [id number]  PCP:  Jac Canavan, PA-C  Cardiologist:   Chilton Si, MD   No chief complaint on file.    History of Present Illness: Bobby Kelly is a 41 y.o. male with chronic systolic and diastolic heart failure (resolved) here for follow up.  Mr. Bobby Kelly first saw [name] 08/2016 when he was referred for tachycardia, chest pain, and shortness of breath.  He was noted to have episodes of tachycardia to the 100s on his Fitbit.  His heart rate increased to the 130s with minimal exertion.  He had recently been started on antihypertensives by his primary care provider.  He had an echocardiogram 09/19/2016 that revealed LVEF 35 to 40% with grade 2 diastolic dysfunction.  Hydrochlorothiazide and amlodipine were switched to losartan and metoprolol.  He had a repeat echocardiogram 04/2017 that revealed improvement in his LVEF to 50 to 55%.  He followed up with [name] 09/27/2017 and losartan was reduced due to low blood pressures. [name] noted that he had episodes of sinus tachycardia.  He had him wear a 48-hour Holter that did not reveal any other significant arrhythmias.  He had an exercise Myoview 09/12/2016 that revealed LVEF 44% with a concern for inferior infarct with peri-infarct ischemia versus diaphragmatic attenuation.  Mr. Bobby Kelly was seen in the ED 09/26/2017 with dyspnea.  He became short of breath and dizzy when driving home.  Cardiac enzymes were negative and his EKG was he was then referred without ischemic changes.   At his initial appointment, Mr. Bobby Kelly reported intermittent chest pain lasting 2 weeks prior. The pain occurred while sitting down and persisted for a few days. He described the pain as a pinching sensation. He reported shortness of breath while sleeping and not feeling rested. He was referred for sleep study and was found to have OSA.   He was seen in the ED 10/18/2019 with  palpitations and dizziness. He had an episode of dizziness while on telemetry and vitals were unremarkable at the time. He called the office and was recommended that he increase metoprolol to 50 mg but he never did so. At his last appointment, he reported continued dizziness and palpitations.  He was referred for an echo 10/2019 that revealed LVEF 65 to 70% with normal diastolic function.  It was otherwise unremarkable.  Metoprolol was increased.  He continued to have episodes of dizziness that he thought was vertigo.   Today, he is doing well. Occasionally, he feels a central chest pain he describes as a pinching sensation. The pain occurs when he is sitting down. His blood pressure at home stays around 120/80. He is exercising more in the past 6 weeks. He feels more energetic and is trying to lose weight. His weight goal is to be under 200 lbs. He wonders if he can reduce his metoprolol dose because of his better controlled blood pressure and exercise. For diet, he intends to eat less carbohydrates similar to Keto. He does not drink soda. He denies any palpitations, shortness of breath, lightheadedness, headaches, syncope, orthopnea, PND, lower extremity edema or exertional symptoms.  Past Medical History:  Diagnosis Date   Allergy    CHF (congestive heart failure) (HCC) 2018   Cholelithiasis 2018   cholecystectomy with complication, drain    Dry skin    nasal folds   Hypertension    OSA (obstructive sleep apnea)  Thyroid disease    hyperactive in high school, had oral therapy.     Wears contact lenses     Past Surgical History:  Procedure Laterality Date   APPENDECTOMY     CHOLECYSTECTOMY N/A 12/03/2016   Procedure: LAPAROSCOPIC CHOLECYSTECTOMY;  Surgeon: Violeta Gelinas, MD;  Location: [facility] OR;  Service: General;  Laterality: N/A;   ERCP N/A 12/19/2016   Procedure: ENDOSCOPIC RETROGRADE CHOLANGIOPANCREATOGRAPHY (ERCP);  Surgeon: Vida Rigger, MD;  Location: [facility] ENDOSCOPY;  Service:  Endoscopy;  Laterality: N/A;   ESOPHAGOGASTRODUODENOSCOPY (EGD) WITH PROPOFOL N/A 12/13/2016   Procedure: ESOPHAGOGASTRODUODENOSCOPY (EGD) WITH PROPOFOL;  Surgeon: Willis Modena, MD;  Location: [facility] ENDOSCOPY;  Service: Endoscopy;  Laterality: N/A;   ESOPHAGOGASTRODUODENOSCOPY (EGD) WITH PROPOFOL N/A 12/15/2016   Procedure: ESOPHAGOGASTRODUODENOSCOPY (EGD) WITH PROPOFOL;  Surgeon: Willis Modena, MD;  Location: [facility] ENDOSCOPY;  Service: Endoscopy;  Laterality: N/A;   ESOPHAGOGASTRODUODENOSCOPY (EGD) WITH PROPOFOL N/A 04/11/2017   Procedure: ESOPHAGOGASTRODUODENOSCOPY (EGD) WITH PROPOFOL;  Surgeon: Vida Rigger, MD;  Location: [facility] ENDOSCOPY;  Service: Endoscopy;  Laterality: N/A;  side viewing scope- biliary stent removal   IR CATHETER TUBE CHANGE  12/23/2016   IR RADIOLOGIST EVAL & MGMT  01/03/2017   IR RADIOLOGIST EVAL & MGMT  01/17/2017   IR RADIOLOGIST EVAL & MGMT  01/25/2017   IR RADIOLOGIST EVAL & MGMT  02/09/2017   IR RADIOLOGIST EVAL & MGMT  02/21/2017   IR THORACENTESIS ASP PLEURAL SPACE W/IMG GUIDE  12/23/2016     Current Outpatient Medications  Medication Sig Dispense Refill   aspirin-acetaminophen-caffeine (EXCEDRIN MIGRAINE) 250-250-65 MG tablet Take 1 tablet by mouth every 6 (six) hours as needed for headache.     Loratadine-Pseudoephedrine (CLARITIN-D 24 HOUR PO) Take 1 tablet by mouth daily.     losartan (COZAAR) 25 MG tablet TAKE 1 TABLET(25 MG) BY MOUTH DAILY 90 tablet 3   naproxen (NAPROSYN) 500 MG tablet Take 1 tablet (500 mg total) by mouth 2 (two) times daily with a meal. Take until pain resolved 30 tablet 0   polyethylene glycol powder (GLYCOLAX/MIRALAX) 17 GM/SCOOP powder Take 17 g by mouth daily. 116 g 0   Propylene Glycol (SYSTANE COMPLETE OP) Place 1-2 drops into both eyes See admin instructions. Place 1-2 drops into each eye prior to putting in contacts daily     sildenafil (VIAGRA) 100 MG tablet 1/2-1 tablet per 24 hour period for ED 10 tablet 1   tadalafil (CIALIS) 20 MG  tablet Take 0.5-1 tablets (10-20 mg total) by mouth every other day as needed for erectile dysfunction. 10 tablet 3   metoprolol succinate (TOPROL-XL) 25 MG 24 hr tablet Take 1 tablet (25 mg total) by mouth daily. Take with or immediately following a meal. 90 tablet 3   No current facility-administered medications for this visit.    Allergies:   Patient has no known allergies.    Social History:  The patient  reports that he has never smoked. He has never used smokeless tobacco. He reports current alcohol use of about 4.0 standard drinks per week. He reports that he does not use drugs.   Family History:  The patient's family history includes Cancer in his maternal aunt and maternal grandmother; Cataracts in his mother; Diabetes in his father and another family member; Hyperlipidemia in his father; Hypertension in his mother; Pancreatic cancer in his maternal grandmother.    ROS:  Please see the history of present illness.   (+) Central chest pain All other systems are reviewed and negative.   PHYSICAL  EXAM: VS:  BP 110/70 (BP Location: Left Arm, Patient Position: Sitting, Cuff Size: Large)    Pulse 72    Ht 5\' 5"  (1.651 m)    Wt 228 lb 3.2 oz (103.5 kg)    BMI 37.97 kg/m  , BMI Body mass index is 37.97 kg/m. GENERAL:  Well appearing HEENT: Pupils equal round and reactive, fundi not visualized, oral mucosa unremarkable NECK:  No jugular venous distention, waveform within normal limits, carotid upstroke brisk and symmetric, no bruits LUNGS:  Clear to auscultation bilaterally HEART:  RRR.  PMI not displaced or sustained,S1 and S2 within normal limits, no S3, no S4, no clicks, no rubs, no murmurs ABD:  Flat, positive bowel sounds normal in frequency in pitch, no bruits, no rebound, no guarding, no midline pulsatile mass, no hepatomegaly, no splenomegaly EXT:  2 plus pulses throughout, no edema, no cyanosis no clubbing SKIN:  No rashes no nodules NEURO:  Cranial nerves II through XII grossly  intact, motor grossly intact throughout PSYCH:  Cognitively intact, oriented to person place and time   EKG:   03/10/21: Sinus rhythm, rate 72 bpm 10/30/19: sinus rhythm.  Rate 89 bpm.    Echo 11/07/2019:  1. Left ventricular ejection fraction, by estimation, is 65 to 70%. The  left ventricle has normal function. The left ventricle has no regional  wall motion abnormalities. Left ventricular diastolic parameters were  normal.   2. Right ventricular systolic function is normal. The right ventricular  size is normal. There is normal pulmonary artery systolic pressure.   3. The mitral valve is grossly normal. Trivial mitral valve  regurgitation.   4. The aortic valve is tricuspid. Aortic valve regurgitation is not  visualized. Mild aortic valve sclerosis is present, with no evidence of  aortic valve stenosis.   Monitor 05/28/18 14 Day Event Monitor   Quality: Fair.  Baseline artifact. Predominant rhythm: sinus rhythm Average heart rate: 80 bpm Max heart rate: 153 bpm Min heart rate: 45 bpm   Patient reported chest pain, at which time sinus rhythm was noted. No arrhythmias noted.  Monitor 09/27/17 Minimum HR: 46 BPM at 4:31:31 AM(2) Maximum HR: 129 BPM at 8:42:43 PM Average HR: 81 BPM Sinus rhythm with sinus tachycardia seen. No arrhythmias noted.  Echo 04/10/17: Study Conclusions   - Left ventricle: The cavity size was normal. Wall thickness was   normal. Systolic function was normal. The estimated ejection   fraction was in the range of 50% to 55%. Wall motion was normal;   there were no regional wall motion abnormalities. The study is   not technically sufficient to allow evaluation of LV diastolic   function. - Mitral valve: Calcified annulus. Mildly thickened leaflets .  Lexiscan Myoview 09/2016: Nuclear stress EF: 44%. No significant change in nonspecific ST abnormality during infusion. There is a small defect of mild severity present in the basal inferior and mid  inferior location. The defect is partially reversible and consistent with infarct with peri infarct ischemia vs. Variations in diaphragmatic attenuation artifact. The left ventricular ejection fraction is moderately decreased (30-44%). This is an intermediate risk study. Recommend 2D echo for further assessment of LVF and inferior wall motion.   Echo 09/19/16: Study Conclusions   - Left ventricle: The cavity size was normal. Wall thickness was   normal. Systolic function was moderately reduced. The estimated   ejection fraction was in the range of 35% to 40%. Features are   consistent with a pseudonormal left ventricular filling pattern,  with concomitant abnormal relaxation and increased filling   pressure (grade 2 diastolic dysfunction). - Right atrium: The atrium was mildly dilated.    Recent Labs: 02/19/2021: ALT 55; BUN 20; Creatinine, Ser 0.98; Hemoglobin 17.9; Platelets 281; Potassium 4.2; Sodium 140; TSH 1.800    Lipid Panel    Component Value Date/Time   CHOL 167 02/19/2021 1024   TRIG 92 02/19/2021 1024   HDL 58 02/19/2021 1024   CHOLHDL 2.9 02/19/2021 1024   CHOLHDL 3.3 11/03/2016 0811   LDLCALC 92 02/19/2021 1024   LDLCALC 94 11/03/2016 0811      Wt Readings from Last 3 Encounters:  03/10/21 228 lb 3.2 oz (103.5 kg)  02/25/21 224 lb (101.6 kg)  02/19/21 223 lb (101.2 kg)      ASSESSMENT AND PLAN: Essential hypertension BP is well-controlled.  Continue losartan and we will reduce metoprolol to 25mg  daily.  He will continue to monitor BP and HR.  OSA on CPAP Continue CPAP.  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  Systolic function has recovered and heart rates are well-controlled.  He is interested in being on fewer medications.  We will reduce metoprolol to 25mg .  He will continue to monitor his heart rate. He is euvolemic and doing well.  Continue losartan and repeat echo 10/2021, which will be 2 years from prior  study.  BMI 37.0-37.9, adult He is working on weight loss.  Continue with exercise and we discussed nutritional recommendations to reduce carbohydrate intake and increase protein.  Recommend lean proteins.     Current medicines are reviewed at length with the patient today.  The patient does not have concerns regarding medicines.  The following changes have been made:  stop spironolactone  Labs/ tests ordered today include:   Orders Placed This Encounter  Procedures   EKG 12-Lead   ECHOCARDIOGRAM COMPLETE     Disposition:   FU with Isabellamarie Randa C. 11/2021, MD, [facility] in 8 months after echo  Duke Salvia as a scribe for [facility], MD.,have documented all relevant documentation on the behalf of [employer] [facility], MD,as directed by  Chilton Si, MD while in the presence of Chilton Si, MD.  I, Nalee Lightle C. Chilton Si, MD have reviewed all documentation for this visit.  The documentation of the exam, diagnosis, procedures, and orders on 03/10/2021 are all accurate and complete.   Signed, Reigan Tolliver C. Duke Salvia, MD, [facility]  03/10/2021 5:46 PM    [facility]

## 2021-03-12 ENCOUNTER — Other Ambulatory Visit (HOSPITAL_BASED_OUTPATIENT_CLINIC_OR_DEPARTMENT_OTHER): Payer: Self-pay | Admitting: *Deleted

## 2021-03-12 DIAGNOSIS — I10 Essential (primary) hypertension: Secondary | ICD-10-CM

## 2021-03-12 DIAGNOSIS — I5042 Chronic combined systolic (congestive) and diastolic (congestive) heart failure: Secondary | ICD-10-CM

## 2021-04-08 ENCOUNTER — Other Ambulatory Visit: Payer: Self-pay

## 2021-04-08 ENCOUNTER — Encounter: Payer: Self-pay | Admitting: Dietician

## 2021-04-08 ENCOUNTER — Encounter: Payer: Commercial Managed Care - PPO | Attending: Medical | Admitting: Dietician

## 2021-04-08 DIAGNOSIS — R7301 Impaired fasting glucose: Secondary | ICD-10-CM | POA: Insufficient documentation

## 2021-04-08 DIAGNOSIS — Z6837 Body mass index (BMI) 37.0-37.9, adult: Secondary | ICD-10-CM | POA: Diagnosis present

## 2021-04-08 NOTE — Progress Notes (Signed)
Medical Nutrition Therapy  ?Appointment Start time:  1445  Appointment End time:  1555 ? ?Primary concerns today: Weight Loss  ?Referral diagnosis: R73.01 - Impaired fasting blood sugar, Z68.37 - BMI 37.0 - 37.9, Adult ?Preferred learning style: No preference indicated ?Learning readiness: Change in progress ? ? ?NUTRITION ASSESSMENT  ? ?Anthropometrics  ?Ht: 65" ?Wt: 214.6 lbs ?Body mass index is 35.71 kg/m?. ? ? ?Clinical ?Medical Hx: HTN, Fatty Liver, Heart Failure, Insulin Resistance ?Medications: Metoprolol, Losartan ?Labs: A1c - 5.6, Insulin - 22.9 (High end of range), ALT - 55 (Hx of elevated LFTs) ?Notable Signs/Symptoms: N/A ? ?Lifestyle & Dietary Hx ?Pt reports trying to lose weight in the past with limited long-term success. Pt PCP saw signs of fatty liver last year, and signs of IR recently. ?Pt reports family history of diabetes and does not want to develop T2D. Pt has history of HTN and heart failure, BP is well controlled with medication. ?Pt is currently intermittent fasting to lose weight, eating window is from 11:00 am - 7:00 pm. Pt gets up and starts their day around 5:00 am, goes to the gym, then goes into work by 8:00 am. Pt has lost about 10 pounds in the past 2 months. ?Workout regiment includes 20 min run, elliptical, "push-pull" upper body routine, legs/core another day, Body Pump classes on Thursday morning, and rest on weekends. Pt signed up for weekly evening boxing classes last week. Pt feels more energetic at these classes than for their morning workouts. ?Pt reports trying to cut out carbs, especially rice and pasta, and is trying to eat mostly protein and veggies at mealtime. ?Pt reports history of drinking frequently, currently drinking much less. Pt may have a few drinks on the weekends. ? ? ?Estimated daily fluid intake: 60-70 oz ?Supplements: MVI ?Sleep: OSA on CPAP, sleeps well ?Stress / self-care: Moderate, not too bad ?Current average weekly physical activity: ADLs,  ? ?24-Hr  Dietary Recall ?First Meal: [place] buffet chicken wings, scallops w/cheese, mushrooms, shrimp, broccoli, water ?Snack: Protein bar ?Second Meal: 3 Popusas, 2 pork, 1 bean, OJ ?Snack: none ?Third Meal: none ?Snack: none ?Beverages: Water, OJ ? ? ? ?NUTRITION DIAGNOSIS  ?NB-1.1 Food and nutrition-related knowledge deficit As related to obesity.  As evidenced by BMI of 35.71 kg/m2, and history of weight cycling, and consumption of energy dense foods.. ? ? ?NUTRITION INTERVENTION  ?Nutrition education (E-1) on the following topics:  ?Educated patient on the two components of energy balance: Energy in (calories), and energy out (activity). Explain the role of negative energy balance in weight loss. Discussed options with patient to achieve a negative energy balance and how to best control energy in and energy out to accommodate their lifestyle. ?Educated pt on the importance of eating consistently throughout the day to maintain the desired metabolic state for weight loss. ? ?Handouts Provided Include  ?Protein Foods list ?Balanced Plate ?Energy Balance Worksheet ?Metabolism during the Absorptive State ?Metabolism during the Post-Absorptive State ? ?Learning Style & Readiness for Change ?Teaching method utilized: Visual & Auditory  ?Demonstrated degree of understanding via: Teach Back  ?Barriers to learning/adherence to lifestyle change: None ? ?Goals Established by Pt ?Add in 4-8 oz of orange juice 30 minutes before your exercise and 8 oz of Farilife Chocolate Milk ?Begin to recognize carbohydrates, proteins, and non-starchy vegetables in your food choices! ?Begin to build your meals using the proportions of the Balanced Plate. ?First, select your carb choice(s) for the meal, .Make this 25% of your meal. ?Next,  select your source of protein to pair with your carb choice(s). Make this another 25% of your meal. ?Finally, complete your meal with a variety of non-starchy vegetables. Make this the remaining 50% of your  meal. ?Weigh yourself no more than once a week. Look for weight loss of 1-2 pounds a week ?Use your "Energy Balance" worksheet to write down everything you do, big or small, to work towards a negative energy balance. Write down anything you do to consume fewer calories or increase the frequency or intensity of your exercise. ?Look into choosing leaner protein sources to lower your fat consumption. ? ? ?MONITORING & EVALUATION ?Dietary intake, weekly physical activity, and dietary pattern in 2 months. ? ?Next Steps  ?Patient is to log energy balance achievements, and follow up with RD. ? ?

## 2021-04-08 NOTE — Patient Instructions (Addendum)
Add in 4-8 oz of orange juice 30 minutes before your exercise and 8 oz of Farilife Chocolate Milk ? ?Begin to recognize carbohydrates, proteins, and non-starchy vegetables in your food choices! ? ?Begin to build your meals using the proportions of the Balanced Plate. ?First, select your carb choice(s) for the meal, .Make this 25% of your meal. ?Next, select your source of protein to pair with your carb choice(s). Make this another 25% of your meal. ?Finally, complete your meal with a variety of non-starchy vegetables. Make this the remaining 50% of your meal. ? ?Weigh yourself no more than once a week. Look for weight loss of 1-2 pounds a week. ? ?Use your "Energy Balance" worksheet to write down everything you do, big or small, to work towards a negative energy balance. Write down anything you do to consume fewer calories or increase the frequency or intensity of your exercise. ? ?Look into choosing leaner protein sources to lower your fat consumption. ? ?

## 2021-06-04 ENCOUNTER — Ambulatory Visit (HOSPITAL_BASED_OUTPATIENT_CLINIC_OR_DEPARTMENT_OTHER): Payer: Commercial Managed Care - PPO | Admitting: Cardiovascular Disease

## 2021-06-10 ENCOUNTER — Ambulatory Visit: Payer: Commercial Managed Care - PPO | Admitting: Dietician

## 2021-08-19 ENCOUNTER — Telehealth: Payer: Self-pay | Admitting: Internal Medicine

## 2021-08-19 ENCOUNTER — Ambulatory Visit (INDEPENDENT_AMBULATORY_CARE_PROVIDER_SITE_OTHER): Payer: Commercial Managed Care - PPO | Admitting: Medical

## 2021-08-19 ENCOUNTER — Encounter: Payer: Self-pay | Admitting: Medical

## 2021-08-19 VITALS — BP 120/80 | HR 71 | Wt 210.2 lb

## 2021-08-19 DIAGNOSIS — K760 Fatty (change of) liver, not elsewhere classified: Secondary | ICD-10-CM | POA: Diagnosis not present

## 2021-08-19 DIAGNOSIS — Z7185 Encounter for immunization safety counseling: Secondary | ICD-10-CM

## 2021-08-19 DIAGNOSIS — T63441A Toxic effect of venom of bees, accidental (unintentional), initial encounter: Secondary | ICD-10-CM

## 2021-08-19 DIAGNOSIS — I10 Essential (primary) hypertension: Secondary | ICD-10-CM | POA: Diagnosis not present

## 2021-08-19 DIAGNOSIS — G4733 Obstructive sleep apnea (adult) (pediatric): Secondary | ICD-10-CM | POA: Diagnosis not present

## 2021-08-19 DIAGNOSIS — I428 Other cardiomyopathies: Secondary | ICD-10-CM

## 2021-08-19 DIAGNOSIS — R7301 Impaired fasting glucose: Secondary | ICD-10-CM

## 2021-08-19 DIAGNOSIS — Z9989 Dependence on other enabling machines and devices: Secondary | ICD-10-CM

## 2021-08-19 DIAGNOSIS — Z6834 Body mass index (BMI) 34.0-34.9, adult: Secondary | ICD-10-CM

## 2021-08-19 MED ORDER — WEGOVY 0.5 MG/0.5ML ~~LOC~~ SOAJ: 0.5000 mg | SUBCUTANEOUS | 0 refills | Status: DC

## 2021-08-19 MED ORDER — WEGOVY 0.25 MG/0.5ML ~~LOC~~ SOAJ: 0.2500 mg | SUBCUTANEOUS | 0 refills | Status: DC

## 2021-08-19 NOTE — Assessment & Plan Note (Signed)
Continue CPAP.  

## 2021-08-19 NOTE — Assessment & Plan Note (Signed)
Overall improving with healthy lifestyle changes since December 2022

## 2021-08-19 NOTE — Assessment & Plan Note (Signed)
I congratulated him on his efforts to lose weight.  Continue efforts to lose weight through healthy low-fat eating plan currently using intermittent fasting and regular exercise.  For review back over his 12/2020 ultrasound showing fatty liver deposits

## 2021-08-19 NOTE — Assessment & Plan Note (Signed)
I reviewed his February 2023 cardiology notes.  Things have been stable.  Continue current medications for blood pressure.

## 2021-08-19 NOTE — Assessment & Plan Note (Signed)
He notes some improvement since yesterday.  Continue with antihistamine 2-3 times daily.  Can use Claritin in the morning for nondrowsy, Benadryl otherwise.  Continue cool therapy to the lip and consider cool pack to the lateral chest wall from the other bee stings.  Symptoms are gradually improved.

## 2021-08-19 NOTE — Assessment & Plan Note (Signed)
I recommended pneumococcal 13 vaccine.  He can do this on a different date since he is doing with the bee sting currently.

## 2021-08-19 NOTE — Assessment & Plan Note (Signed)
We discussed that if he loses another 10 pounds he might start to see a drop in his blood pressure.  He will monitor his blood pressures.  If he starts seeing lower readings we may need to pull back on the dose of some of his blood pressure medications in conjunction with cardiology recommendations.

## 2021-08-19 NOTE — Progress Notes (Signed)
Subjective:  Bobby Kelly is a 41 y.o. male who presents for Chief Complaint  Patient presents with   Follow-up    6 month follow up and wants to discuss [facility].      Here for recheck.  He notes since last visit has been exercising almost every day since end of December . Is using intermittent fasting.  Has actually gone down a size in shirt and pants.   He has been doing weightbearing and aerobic exercise.  He lost some weight initially but feels like he is gaining some muscle mass with the weightbearing exercise and given the change in pant size.  He is interested in trying [facility] which we discussed at his last visit.  He would like to keep pushing towards weight loss goals.  He eventually wants to get off of some of his blood pressure medicines as well.  He saw cardiology back in February 2023.  He is compliant with his blood pressure medicines.  He has no chest pain, palpitations or other problems.  He got stung by some yellow jackets yesterday.  He was mowing the yard and must have run over a nest.  He got stung multiple times on the right chest and abdomen wall, right hip and right lip.  His lip was quite swollen yesterday but is better today although it is still swollen.  He also got stung by wasp on his right hand a few weeks ago with localized swelling to the hand.  No shortness of breath or wheezing.  Using some pain medicine over-the-counter for pain last night and using Benadryl over-the-counter.  No other aggravating or relieving factors.    No other c/o.  Past Medical History:  Diagnosis Date   Allergy    CHF (congestive heart failure) (HCC) 2018   Cholelithiasis 2018   cholecystectomy with complication, drain    Dry skin    nasal folds   Hypertension    OSA (obstructive sleep apnea)    Thyroid disease    hyperactive in high school, had oral therapy.     Wears contact lenses    Current Outpatient Medications on File Prior to Visit  Medication Sig Dispense Refill    aspirin-acetaminophen-caffeine (EXCEDRIN MIGRAINE) 250-250-65 MG tablet Take 1 tablet by mouth every 6 (six) hours as needed for headache.     Loratadine-Pseudoephedrine (CLARITIN-D 24 HOUR PO) Take 1 tablet by mouth daily.     losartan (COZAAR) 25 MG tablet TAKE 1 TABLET(25 MG) BY MOUTH DAILY 90 tablet 3   metoprolol succinate (TOPROL-XL) 25 MG 24 hr tablet Take 1 tablet (25 mg total) by mouth daily. Take with or immediately following a meal. 90 tablet 3   naproxen (NAPROSYN) 500 MG tablet Take 1 tablet (500 mg total) by mouth 2 (two) times daily with a meal. Take until pain resolved 30 tablet 0   polyethylene glycol powder (GLYCOLAX/MIRALAX) 17 GM/SCOOP powder Take 17 g by mouth daily. 116 g 0   Propylene Glycol (SYSTANE COMPLETE OP) Place 1-2 drops into both eyes See admin instructions. Place 1-2 drops into each eye prior to putting in contacts daily     tadalafil (CIALIS) 20 MG tablet Take 0.5-1 tablets (10-20 mg total) by mouth every other day as needed for erectile dysfunction. 10 tablet 3   No current facility-administered medications on file prior to visit.    The following portions of the patient's history were reviewed and updated as appropriate: allergies, current medications, past family history, past medical history, past  social history, past surgical history and problem list.  ROS Otherwise as in subjective above     Objective: BP 120/80   Pulse 71   Wt 210 lb 3.2 oz (95.3 kg)   SpO2 96%   BMI 34.98 kg/m   Wt Readings from Last 3 Encounters:  08/19/21 210 lb 3.2 oz (95.3 kg)  04/08/21 214 lb 9.6 oz (97.3 kg)  03/10/21 228 lb 3.2 oz (103.5 kg)   BP Readings from Last 3 Encounters:  08/19/21 120/80  03/10/21 110/70  02/25/21 120/80   General appearance: alert, no distress, well developed, well nourished Skin: Swollen bottom lip in general from bee sting, he has several small swollen pinkish lesions on his right lateral chest and abdomen from bee sting.  No signs of  infection. Neck: supple, no lymphadenopathy, no thyromegaly, no masses Heart: RRR, normal S1, S2, no murmurs Lungs: CTA bilaterally, no wheezes, rhonchi, or rales Pulses: 2+ radial pulses, 2+ pedal pulses, normal cap refill Ext: no edema    Assessment: Encounter Diagnoses  Name Primary?   OSA on CPAP Yes   Fatty liver    Essential hypertension    Other cardiomyopathy (HCC)    Impaired fasting blood sugar    BMI 34.0-34.9,adult    Bee sting, accidental or unintentional, initial encounter    Vaccine counseling      Plan: Problem List Items Addressed This Visit     Vaccine counseling    I recommended pneumococcal 13 vaccine.  He can do this on a different date since he is doing with the bee sting currently.      Cardiomyopathy [facility])    I reviewed his February 2023 cardiology notes.  Things have been stable.  Continue current medications for blood pressure.      Impaired fasting blood sugar    Overall improving with healthy lifestyle changes since December 2022      OSA on CPAP - Primary    Continue CPAP      Essential hypertension    We discussed that if he loses another 10 pounds he might start to see a drop in his blood pressure.  He will monitor his blood pressures.  If he starts seeing lower readings we may need to pull back on the dose of some of his blood pressure medications in conjunction with cardiology recommendations.      Fatty liver    I congratulated him on his efforts to lose weight.  Continue efforts to lose weight through healthy low-fat eating plan currently using intermittent fasting and regular exercise.  For review back over his 12/2020 ultrasound showing fatty liver deposits      Bee sting    He notes some improvement since yesterday.  Continue with antihistamine 2-3 times daily.  Can use Claritin in the morning for nondrowsy, Benadryl otherwise.  Continue cool therapy to the lip and consider cool pack to the lateral chest wall from the other bee  stings.  Symptoms are gradually improved.      BMI 34.0-34.9,adult    I congratulated him on his weight loss.  Continue efforts through intermittent fasting, low-fat diet, regular exercise.  He is exercising with weightbearing and aerobic exercise almost daily.  He does want to start [facility].  We discussed proper use of medication.  Begin Wegovy trial.  Demonstration given.  Follow-up in 6 weeks.        Follow up: 6 wk

## 2021-08-19 NOTE — Telephone Encounter (Signed)
P.A. Reginal Lutes. done through [email or web address]

## 2021-08-19 NOTE — Telephone Encounter (Signed)
PA was approved and pt was notified. I have faxed to pharmacy of approval

## 2021-08-19 NOTE — Assessment & Plan Note (Signed)
I congratulated him on his weight loss.  Continue efforts through intermittent fasting, low-fat diet, regular exercise.  He is exercising with weightbearing and aerobic exercise almost daily.  He does want to start [facility].  We discussed proper use of medication.  Begin Wegovy trial.  Demonstration given.  Follow-up in 6 weeks.

## 2021-08-26 ENCOUNTER — Other Ambulatory Visit (HOSPITAL_COMMUNITY): Payer: Self-pay

## 2021-10-01 ENCOUNTER — Ambulatory Visit: Payer: Commercial Managed Care - PPO | Admitting: Medical

## 2021-10-13 ENCOUNTER — Encounter: Payer: Self-pay | Admitting: Internal Medicine

## 2021-10-19 ENCOUNTER — Ambulatory Visit (INDEPENDENT_AMBULATORY_CARE_PROVIDER_SITE_OTHER): Payer: Commercial Managed Care - PPO

## 2021-10-19 DIAGNOSIS — I5042 Chronic combined systolic (congestive) and diastolic (congestive) heart failure: Secondary | ICD-10-CM | POA: Diagnosis not present

## 2021-10-19 DIAGNOSIS — I10 Essential (primary) hypertension: Secondary | ICD-10-CM

## 2021-10-19 LAB — ECHOCARDIOGRAM COMPLETE
Area-P 1/2: 3.65 cm2
S' Lateral: 2.99 cm

## 2021-10-25 ENCOUNTER — Ambulatory Visit (INDEPENDENT_AMBULATORY_CARE_PROVIDER_SITE_OTHER): Payer: Commercial Managed Care - PPO | Admitting: Family

## 2021-10-25 ENCOUNTER — Encounter (HOSPITAL_BASED_OUTPATIENT_CLINIC_OR_DEPARTMENT_OTHER): Payer: Self-pay | Admitting: Family

## 2021-10-25 VITALS — BP 126/78 | HR 78 | Ht 65.0 in | Wt 215.3 lb

## 2021-10-25 DIAGNOSIS — Z9989 Dependence on other enabling machines and devices: Secondary | ICD-10-CM | POA: Diagnosis not present

## 2021-10-25 DIAGNOSIS — I10 Essential (primary) hypertension: Secondary | ICD-10-CM

## 2021-10-25 DIAGNOSIS — I5042 Chronic combined systolic (congestive) and diastolic (congestive) heart failure: Secondary | ICD-10-CM

## 2021-10-25 DIAGNOSIS — G4733 Obstructive sleep apnea (adult) (pediatric): Secondary | ICD-10-CM

## 2021-10-25 MED ORDER — LOSARTAN POTASSIUM 25 MG PO TABS: 12.5000 mg | ORAL_TABLET | Freq: Every day | ORAL | 3 refills | Status: DC

## 2021-10-25 NOTE — Progress Notes (Signed)
Office Visit    Patient Name: Bobby Kelly Date of Encounter: 10/25/2021  PCP:  [name]   [facility]  Cardiologist:  Chilton Si, MD  Advanced Practice Provider:  No care team member to display Electrophysiologist:  None      Chief Complaint    Bobby Kelly is a 41 y.o. male presents today for follow-up after echocardiogram  Past Medical History    Past Medical History:  Diagnosis Date   Allergy    CHF (congestive heart failure) (HCC) 2018   Cholelithiasis 2018   cholecystectomy with complication, drain    Dry skin    nasal folds   Hypertension    OSA (obstructive sleep apnea)    Thyroid disease    hyperactive in high school, had oral therapy.     Wears contact lenses    Past Surgical History:  Procedure Laterality Date   APPENDECTOMY     CHOLECYSTECTOMY N/A 12/03/2016   Procedure: LAPAROSCOPIC CHOLECYSTECTOMY;  Surgeon: Violeta Gelinas, MD;  Location: [facility] OR;  Service: General;  Laterality: N/A;   ERCP N/A 12/19/2016   Procedure: ENDOSCOPIC RETROGRADE CHOLANGIOPANCREATOGRAPHY (ERCP);  Surgeon: Vida Rigger, MD;  Location: [facility] ENDOSCOPY;  Service: Endoscopy;  Laterality: N/A;   ESOPHAGOGASTRODUODENOSCOPY (EGD) WITH PROPOFOL N/A 12/13/2016   Procedure: ESOPHAGOGASTRODUODENOSCOPY (EGD) WITH PROPOFOL;  Surgeon: Willis Modena, MD;  Location: [facility] ENDOSCOPY;  Service: Endoscopy;  Laterality: N/A;   ESOPHAGOGASTRODUODENOSCOPY (EGD) WITH PROPOFOL N/A 12/15/2016   Procedure: ESOPHAGOGASTRODUODENOSCOPY (EGD) WITH PROPOFOL;  Surgeon: Willis Modena, MD;  Location: [facility] ENDOSCOPY;  Service: Endoscopy;  Laterality: N/A;   ESOPHAGOGASTRODUODENOSCOPY (EGD) WITH PROPOFOL N/A 04/11/2017   Procedure: ESOPHAGOGASTRODUODENOSCOPY (EGD) WITH PROPOFOL;  Surgeon: Vida Rigger, MD;  Location: [facility] ENDOSCOPY;  Service: Endoscopy;  Laterality: N/A;  side viewing scope- biliary stent removal   IR CATHETER TUBE CHANGE  12/23/2016   IR RADIOLOGIST EVAL & MGMT   01/03/2017   IR RADIOLOGIST EVAL & MGMT  01/17/2017   IR RADIOLOGIST EVAL & MGMT  01/25/2017   IR RADIOLOGIST EVAL & MGMT  02/09/2017   IR RADIOLOGIST EVAL & MGMT  02/21/2017   IR THORACENTESIS ASP PLEURAL SPACE W/IMG GUIDE  12/23/2016    Allergies  No Known Allergies  History of Present Illness    Bobby Kelly is a 41 y.o. male with a hx of chronic systolic and diastolic heart failure (in setting of tachycardia, resolved), hypertension, OSA, obesity last seen 03/10/2021 by Dr. Duke Salvia.  Eval by [name] 08/2016 for tachycardia, chest pain, shortness of breath.  Echo 09/16/2016 LVEF 35-40%, grade 2 diastolic dysfunction.  HCTZ and amlodipine switched to losartan and metoprolol.  Repeat echo 04/2017 LVEF 50 to 55%.  Losartan reduced 2019 due to hypotension.  48-hour monitor with no other significant arrhythmias.  Exercise Myoview 09/12/2016 EF 44% with concern for inferior infarct with peri-infarct ischemia versus diaphragmatic attenuation.  Prior sleep study revealed OSA.  Repeat echo 10/2019 LVEF 65 to 70%, normal diastolic function.  Metoprolol increased due to palpitations.  Continued to have episodes of dizziness which was attributed to vertigo.  Last seen 03/10/2021 noting feeling overall well.  BP at home 120s over 80s.  He was interesting in reducing medications.  Spironolactone was discontinued.  Presents today for follow-up independently.  Weight down 13 pounds since office visit 6 months ago.  Works in Therapist, music for [employer]. Losing more weight by exercising. Has been running 20-25 minutes in the morning then doing weights in the afternoon. Eats  out most of the time -discussed tactics for follow a healthy diet while eating out and discussed the importance of eating at home. Was previously intermittent fasting with good results and may return to this. Reports no shortness of breath nor dyspnea on exertion. Reports no chest pain, pressure, or tightness. No edema, orthopnea, PND. Reports no  palpitations.  Continues to be interested in reducing medications though no noted side effects.  EKGs/Labs/Other Studies Reviewed:   The following studies were reviewed today:  Echo 10/19/21  1. Left ventricular ejection fraction, by estimation, is 60 to 65%. The  left ventricle has normal function. The left ventricle has no regional  wall motion abnormalities. Left ventricular diastolic parameters were  normal.   2. Right ventricular systolic function is normal. The right ventricular  size is normal.   3. Left atrial size was mildly dilated.   4. The mitral valve is normal in structure. Trivial mitral valve  regurgitation.   5. The aortic valve is tricuspid. Aortic valve regurgitation is not  visualized. No aortic stenosis is present.   6. The inferior vena cava is normal in size with greater than 50%  respiratory variability, suggesting right atrial pressure of 3 mmHg.   Comparison(s): Compared to prior TTE 10/2019, there is no significant  change.   EKG: No EKG today  Recent Labs: 02/19/2021: ALT 55; BUN 20; Creatinine, Ser 0.98; Hemoglobin 17.9; Platelets 281; Potassium 4.2; Sodium 140; TSH 1.800  Recent Lipid Panel    Component Value Date/Time   CHOL 167 02/19/2021 1024   TRIG 92 02/19/2021 1024   HDL 58 02/19/2021 1024   CHOLHDL 2.9 02/19/2021 1024   CHOLHDL 3.3 11/03/2016 0811   LDLCALC 92 02/19/2021 1024   LDLCALC 94 11/03/2016 0811   Home Medications   Current Meds  Medication Sig   aspirin-acetaminophen-caffeine (EXCEDRIN MIGRAINE) 250-250-65 MG tablet Take 1 tablet by mouth every 6 (six) hours as needed for headache.   Loratadine-Pseudoephedrine (CLARITIN-D 24 HOUR PO) Take 1 tablet by mouth daily.   losartan (COZAAR) 25 MG tablet TAKE 1 TABLET(25 MG) BY MOUTH DAILY   metoprolol succinate (TOPROL-XL) 25 MG 24 hr tablet Take 1 tablet (25 mg total) by mouth daily. Take with or immediately following a meal.   naproxen (NAPROSYN) 500 MG tablet Take 1 tablet (500 mg  total) by mouth 2 (two) times daily with a meal. Take until pain resolved   Propylene Glycol (SYSTANE COMPLETE OP) Place 1-2 drops into both eyes See admin instructions. Place 1-2 drops into each eye prior to putting in contacts daily   Semaglutide-Weight Management (WEGOVY) 0.25 MG/0.5ML SOAJ Inject 0.25 mg into the skin once a week.   Semaglutide-Weight Management (WEGOVY) 0.5 MG/0.5ML SOAJ Inject 0.5 mg into the skin once a week.   tadalafil (CIALIS) 20 MG tablet Take 0.5-1 tablets (10-20 mg total) by mouth every other day as needed for erectile dysfunction.     Review of Systems      All other systems reviewed and are otherwise negative except as noted above.  Physical Exam    VS:  BP 126/78   Pulse 78   Ht 5\' 5"  (1.651 m)   Wt 215 lb 4.8 oz (97.7 kg)   BMI 35.83 kg/m  , BMI Body mass index is 35.83 kg/m.  Wt Readings from Last 3 Encounters:  10/25/21 215 lb 4.8 oz (97.7 kg)  08/19/21 210 lb 3.2 oz (95.3 kg)  04/08/21 214 lb 9.6 oz (97.3 kg)  GEN: Well nourished, overweight, well developed, in no acute distress. HEENT: normal. Neck: Supple, no JVD, carotid bruits, or masses. Cardiac: RRR, no murmurs, rubs, or gallops. No clubbing, cyanosis, edema.  Radials/PT 2+ and equal bilaterally.  Respiratory:  Respirations regular and unlabored, clear to auscultation bilaterally. GI: Soft, nontender, nondistended. MS: No deformity or atrophy. Skin: Warm and dry, no rash. Neuro:  Strength and sensation are intact. Psych: Normal affect.  Assessment & Plan    HTN-BP at goal less than 130/80.  He is interested in reducing his medication regimen.  Reduce losartan to 12.5 mg daily.  Check in via MyChart in 2 weeks.  If BP consistently at goal could trial discontinuation of losartan.  Discussed BP goal less than 130/80.  Anticipate continued weight loss will help to lower his blood pressure.  OSA- CPAP compliance encouraged.   Cardiomyopathy-previous tachycardia mediated  cardiomyopathy.  Systolic function recovered and maintained by most recent echo 10/2021.  He is interested in being on fewer medications.  Spironolactone previously discontinued.  Reduce losartan to 12.5 mg daily.  Check in via MyChart in 2 weeks.  Continue metoprolol 25 mg daily.  BMI 35 / Obesity - Weight loss via diet and exercise encouraged. Discussed the impact being overweight would have on cardiovascular risk.  Graduated on 13 pound weight loss.  Discussed dietary tactics to lower weight as he is eating out most of the time.  PCP prescribed Wegovy but has not been able to start yet due to shortage.        Disposition: Follow up in 1 year(s) with Chilton Si, MD or APP.  Signed, Alver Sorrow, NP 10/25/2021, 8:03 AM [facility]

## 2021-10-25 NOTE — Patient Instructions (Addendum)
Medication Instructions:  Your physician has recommended you make the following change in your medication:   REDUCE Losartan to half tablet (12.5mg ) daily  *If you need a refill on your cardiac medications before your next appointment, please call your pharmacy*   Lab Work: None today.   Testing/Procedures: Recent echocardiogram looked fantastic! Normal heart muscle function.    Follow-Up: At [facility], you and your health needs are our priority.  As part of our continuing mission to provide you with exceptional heart care, we have created designated Provider Care Teams.  These Care Teams include your primary Cardiologist (physician) and Advanced Practice Providers (APPs -  Physician Assistants and Nurse Practitioners) who all work together to provide you with the care you need, when you need it.  We recommend signing up for the patient portal called "MyChart".  Sign up information is provided on this After Visit Summary.  MyChart is used to connect with patients for Virtual Visits (Telemedicine).  Patients are able to view lab/test results, encounter notes, upcoming appointments, etc.  Non-urgent messages can be sent to your provider as well.   To learn more about what you can do with MyChart, go to [email or web address].    Your next appointment:   1 year   The format for your next appointment:   In Person  Provider:   Skeet Latch, MD    Other Instructions  Tips to Measure your Blood Pressure Correctly  Here's what you can do to ensure a correct reading:  Don't drink a caffeinated beverage or smoke during the 30 minutes before the test.  Sit quietly for five minutes before the test begins.  During the measurement, sit in a chair with your feet on the floor and your arm supported so your elbow is at about heart level.  The inflatable part of the cuff should completely cover at least 80% of your upper arm, and the cuff should be placed on bare skin, not over a  shirt.  Don't talk during the measurement.   Blood pressure categories  Blood pressure category SYSTOLIC (upper number)  DIASTOLIC (lower number)  Normal Less than 120 mm Hg and Less than 80 mm Hg  Elevated 120-129 mm Hg and Less than 80 mm Hg  High blood pressure: Stage 1 hypertension 130-139 mm Hg or 80-89 mm Hg  High blood pressure: Stage 2 hypertension 140 mm Hg or higher or 90 mm Hg or higher  Hypertensive crisis (consult your doctor immediately) Higher than 180 mm Hg and/or Higher than 120 mm Hg  Source: American Heart Association and American Stroke Association. For more on getting your blood pressure under control, buy Controlling Your Blood Pressure, a Special Health Report from [facility].   Blood Pressure Log   Date   Time  Blood Pressure  Example: Nov 1 9 AM 124/78                                               Important Information About Sugar

## 2021-11-08 ENCOUNTER — Encounter (HOSPITAL_BASED_OUTPATIENT_CLINIC_OR_DEPARTMENT_OTHER): Payer: Self-pay

## 2021-11-11 NOTE — Telephone Encounter (Signed)
BP log as requested 

## 2021-11-16 ENCOUNTER — Encounter: Payer: Self-pay | Admitting: Internal Medicine

## 2021-11-29 NOTE — Telephone Encounter (Signed)
BP log as requested 

## 2021-11-30 MED ORDER — LOSARTAN POTASSIUM 25 MG PO TABS: 25.0000 mg | ORAL_TABLET | Freq: Every day | ORAL | 3 refills | Status: DC

## 2021-11-30 NOTE — Telephone Encounter (Signed)
As BP not at goal of <130/80 consistently would recommend increase Losartan back to 25mg  daily.   Bobby Dubonnet, NP

## 2022-01-06 ENCOUNTER — Other Ambulatory Visit (HOSPITAL_BASED_OUTPATIENT_CLINIC_OR_DEPARTMENT_OTHER): Payer: Self-pay | Admitting: Cardiovascular Disease

## 2022-01-06 NOTE — Telephone Encounter (Signed)
Rx request sent to pharmacy.  

## 2022-01-09 ENCOUNTER — Other Ambulatory Visit: Payer: Self-pay | Admitting: Cardiovascular Disease

## 2022-01-10 NOTE — Telephone Encounter (Signed)
Rx request sent to pharmacy.  

## 2023-02-09 ENCOUNTER — Ambulatory Visit (INDEPENDENT_AMBULATORY_CARE_PROVIDER_SITE_OTHER): Payer: Commercial Managed Care - PPO | Admitting: Podiatry

## 2023-02-09 ENCOUNTER — Encounter: Payer: Self-pay | Admitting: Podiatry

## 2023-02-09 DIAGNOSIS — L603 Nail dystrophy: Secondary | ICD-10-CM | POA: Diagnosis not present

## 2023-02-09 DIAGNOSIS — R748 Abnormal levels of other serum enzymes: Secondary | ICD-10-CM | POA: Insufficient documentation

## 2023-02-09 DIAGNOSIS — R932 Abnormal findings on diagnostic imaging of liver and biliary tract: Secondary | ICD-10-CM | POA: Insufficient documentation

## 2023-02-09 NOTE — Progress Notes (Signed)
 Subjective:  Patient ID: Bobby Kelly, male    DOB: 1980/12/11,  MRN: [id number] HPI Chief Complaint  Patient presents with   Tinea Pedis    For the longest time, my right foot has been dry and flaky.  My toenail really hurts. N - dry and flaky skin, painful toenail L - plantar right and hallux toenail right D - 3-4 mos plantar, 1 year toenail O - gradually worse C - skin cracks and is flaky, toenail is discolored, thick, and throbs A - standing a while, steel toe shoe T - lotion    43 y.o. male presents with the above complaint.   ROS: Denies fever chills nausea vomit muscle aches pains calf pain back pain chest pain shortness of breath.  Past Medical History:  Diagnosis Date   Allergy    CHF (congestive heart failure) (HCC) 2018   Cholelithiasis 2018   cholecystectomy with complication, drain    Dry skin    nasal folds   Hypertension    OSA (obstructive sleep apnea)    Thyroid disease    hyperactive in high school, had oral therapy.     Wears contact lenses    Past Surgical History:  Procedure Laterality Date   APPENDECTOMY     CHOLECYSTECTOMY N/A 12/03/2016   Procedure: LAPAROSCOPIC CHOLECYSTECTOMY;  Surgeon: Sebastian Moles, MD;  Location: [facility] OR;  Service: General;  Laterality: N/A;   ERCP N/A 12/19/2016   Procedure: ENDOSCOPIC RETROGRADE CHOLANGIOPANCREATOGRAPHY (ERCP);  Surgeon: Rosalie Kitchens, MD;  Location: [facility] ENDOSCOPY;  Service: Endoscopy;  Laterality: N/A;   ESOPHAGOGASTRODUODENOSCOPY (EGD) WITH PROPOFOL  N/A 12/13/2016   Procedure: ESOPHAGOGASTRODUODENOSCOPY (EGD) WITH PROPOFOL ;  Surgeon: Burnette Fallow, MD;  Location: [facility] ENDOSCOPY;  Service: Endoscopy;  Laterality: N/A;   ESOPHAGOGASTRODUODENOSCOPY (EGD) WITH PROPOFOL  N/A 12/15/2016   Procedure: ESOPHAGOGASTRODUODENOSCOPY (EGD) WITH PROPOFOL ;  Surgeon: Burnette Fallow, MD;  Location: [facility] ENDOSCOPY;  Service: Endoscopy;  Laterality: N/A;   ESOPHAGOGASTRODUODENOSCOPY (EGD) WITH PROPOFOL  N/A 04/11/2017   Procedure:  ESOPHAGOGASTRODUODENOSCOPY (EGD) WITH PROPOFOL ;  Surgeon: Rosalie Kitchens, MD;  Location: [facility] ENDOSCOPY;  Service: Endoscopy;  Laterality: N/A;  side viewing scope- biliary stent removal   IR CATHETER TUBE CHANGE  12/23/2016   IR RADIOLOGIST EVAL & MGMT  01/03/2017   IR RADIOLOGIST EVAL & MGMT  01/17/2017   IR RADIOLOGIST EVAL & MGMT  01/25/2017   IR RADIOLOGIST EVAL & MGMT  02/09/2017   IR RADIOLOGIST EVAL & MGMT  02/21/2017   IR THORACENTESIS ASP PLEURAL SPACE W/IMG GUIDE  12/23/2016    Current Outpatient Medications:    aspirin-acetaminophen -caffeine (EXCEDRIN MIGRAINE) 250-250-65 MG tablet, Take 1 tablet by mouth every 6 (six) hours as needed for headache., Disp: , Rfl:    Loratadine-Pseudoephedrine (CLARITIN-D 24 HOUR PO), Take 1 tablet by mouth daily., Disp: , Rfl:    losartan  (COZAAR ) 25 MG tablet, TAKE 1 TABLET(25 MG) BY MOUTH DAILY, Disp: 90 tablet, Rfl: 3   metoprolol  succinate (TOPROL -XL) 25 MG 24 hr tablet, TAKE 1 TABLET(25 MG) BY MOUTH DAILY WITH OR IMMEDIATELY FOLLOWING A MEAL, Disp: 90 tablet, Rfl: 3   naproxen  (NAPROSYN ) 500 MG tablet, Take 1 tablet (500 mg total) by mouth 2 (two) times daily with a meal. Take until pain resolved, Disp: 30 tablet, Rfl: 0   Propylene Glycol (SYSTANE COMPLETE OP), Place 1-2 drops into both eyes See admin instructions. Place 1-2 drops into each eye prior to putting in contacts daily, Disp: , Rfl:    Semaglutide -Weight Management (WEGOVY ) 0.25 MG/0.5ML SOAJ, Inject 0.25 mg into  the skin once a week., Disp: 2 mL, Rfl: 0   Semaglutide -Weight Management (WEGOVY ) 0.5 MG/0.5ML SOAJ, Inject 0.5 mg into the skin once a week., Disp: 2 mL, Rfl: 0   tadalafil  (CIALIS ) 20 MG tablet, Take 0.5-1 tablets (10-20 mg total) by mouth every other day as needed for erectile dysfunction., Disp: 10 tablet, Rfl: 3  No Known Allergies Review of Systems Objective:  There were no vitals filed for this visit.  General: Well developed, nourished, in no acute distress, alert and  oriented x3   Dermatological: Skin is warm, dry and supple bilateral.  Nails 1 through 5 of the right foot appear normal skin appears to be normal.  Plantar aspect of the right foot does demonstrate dry scaly skin with petechial type lesions which appears to be more of a tinea.  Nails 1 through 5 of the right foot are thick yellow dystrophic subungual debris dark and malodor.  Vascular: Dorsalis Pedis artery and Posterior Tibial artery pedal pulses are 2/4 bilateral with immedate capillary fill time. Pedal hair growth present. No varicosities and no lower extremity edema present bilateral.   Neruologic: Grossly intact via light touch bilateral. Vibratory intact via tuning fork bilateral. Protective threshold with Semmes Wienstein monofilament intact to all pedal sites bilateral. Patellar and Achilles deep tendon reflexes 2+ bilateral. No Babinski or clonus noted bilateral.   Musculoskeletal: No gross boney pedal deformities bilateral. No pain, crepitus, or limitation noted with foot and ankle range of motion bilateral. Muscular strength 5/5 in all groups tested bilateral.  Gait: Unassisted, Nonantalgic.    Radiographs:  None taken  Assessment & Plan:   Assessment: Tinea pedis onychomycosis nail dystrophy right foot  Plan: Samples of the skin and nail were taken today to be sent for pathologic evaluation we will follow-up with him in 1 month     Mariesha Venturella T. [place], [place]

## 2023-02-20 ENCOUNTER — Other Ambulatory Visit: Payer: Self-pay | Admitting: Podiatry

## 2023-02-26 ENCOUNTER — Other Ambulatory Visit: Payer: Self-pay | Admitting: Cardiovascular Disease

## 2023-03-09 ENCOUNTER — Ambulatory Visit (INDEPENDENT_AMBULATORY_CARE_PROVIDER_SITE_OTHER): Payer: Commercial Managed Care - PPO | Admitting: Podiatry

## 2023-03-09 DIAGNOSIS — B351 Tinea unguium: Secondary | ICD-10-CM

## 2023-03-09 MED ORDER — TERBINAFINE HCL 250 MG PO TABS: 250.0000 mg | ORAL_TABLET | Freq: Every day | ORAL | 0 refills | Status: AC

## 2023-03-09 NOTE — Progress Notes (Signed)
He presents today for follow-up of his nail pathology.  States that they are unchanged.  He has has had no changes in his medical history or medications.  Objective: Vital signs are stable he is alert and oriented x 3 nail pathology is positive for Trichophyton rubrum.  Assessment: Onychomycosis and nail dystrophy.  Plan: Start him on 30 days of Lamisil.  He will take a single 250 mg tablet daily for the next 30 days.  We did discuss side effects possible complications associated with this drug he understands that and is amenable to it.  Follow-up with him in 30 days for blood work.

## 2023-03-22 ENCOUNTER — Ambulatory Visit: Payer: Commercial Managed Care - PPO | Admitting: Medical

## 2023-03-22 ENCOUNTER — Encounter: Payer: Self-pay | Admitting: Medical

## 2023-03-22 VITALS — BP 130/82 | HR 60 | Ht 65.5 in | Wt 224.2 lb

## 2023-03-22 DIAGNOSIS — R7301 Impaired fasting glucose: Secondary | ICD-10-CM

## 2023-03-22 DIAGNOSIS — K760 Fatty (change of) liver, not elsewhere classified: Secondary | ICD-10-CM

## 2023-03-22 DIAGNOSIS — G4733 Obstructive sleep apnea (adult) (pediatric): Secondary | ICD-10-CM

## 2023-03-22 DIAGNOSIS — Z0000 Encounter for general adult medical examination without abnormal findings: Secondary | ICD-10-CM

## 2023-03-22 DIAGNOSIS — I10 Essential (primary) hypertension: Secondary | ICD-10-CM

## 2023-03-22 NOTE — Progress Notes (Signed)
 Subjective:   HPI  Alva Broxson is a 43 y.o. male who presents for Chief Complaint  Patient presents with   Annual Exam    Fasting cpe, no concerns. Declines vaccines today    Patient Care Team: Felicitas Sine, Kermit Balo, PA-C as PCP - General (Family Medicine) Chilton Si, MD as PCP - Cardiology (Cardiology) Elinor Parkinson, [place] as Consulting Physician (Podiatry) Sees dentist Dr. Jethro Bolus, eye Dr. Vida Rigger, GI  Concerns: Been doing well  Compliant with BP medication  No particular issues   Reviewed their medical, surgical, family, social, medication, and allergy history and updated chart as appropriate.  Past Medical History:  Diagnosis Date   Allergy    CHF (congestive heart failure) (HCC) 2018   Cholelithiasis 2018   cholecystectomy with complication, drain    Dry skin    nasal folds   Hypertension    OSA (obstructive sleep apnea)    Thyroid disease    hyperactive in high school, had oral therapy.     Wears contact lenses     Past Surgical History:  Procedure Laterality Date   APPENDECTOMY     CHOLECYSTECTOMY N/A 12/03/2016   Procedure: LAPAROSCOPIC CHOLECYSTECTOMY;  Surgeon: Violeta Gelinas, MD;  Location: [facility] OR;  Service: General;  Laterality: N/A;   ERCP N/A 12/19/2016   Procedure: ENDOSCOPIC RETROGRADE CHOLANGIOPANCREATOGRAPHY (ERCP);  Surgeon: Vida Rigger, MD;  Location: [facility] ENDOSCOPY;  Service: Endoscopy;  Laterality: N/A;   ESOPHAGOGASTRODUODENOSCOPY (EGD) WITH PROPOFOL N/A 12/13/2016   Procedure: ESOPHAGOGASTRODUODENOSCOPY (EGD) WITH PROPOFOL;  Surgeon: Willis Modena, MD;  Location: [facility] ENDOSCOPY;  Service: Endoscopy;  Laterality: N/A;   ESOPHAGOGASTRODUODENOSCOPY (EGD) WITH PROPOFOL N/A 12/15/2016   Procedure: ESOPHAGOGASTRODUODENOSCOPY (EGD) WITH PROPOFOL;  Surgeon: Willis Modena, MD;  Location: [facility] ENDOSCOPY;  Service: Endoscopy;  Laterality: N/A;   ESOPHAGOGASTRODUODENOSCOPY (EGD) WITH PROPOFOL N/A 04/11/2017   Procedure: ESOPHAGOGASTRODUODENOSCOPY  (EGD) WITH PROPOFOL;  Surgeon: Vida Rigger, MD;  Location: [facility] ENDOSCOPY;  Service: Endoscopy;  Laterality: N/A;  side viewing scope- biliary stent removal   IR CATHETER TUBE CHANGE  12/23/2016   IR RADIOLOGIST EVAL & MGMT  01/03/2017   IR RADIOLOGIST EVAL & MGMT  01/17/2017   IR RADIOLOGIST EVAL & MGMT  01/25/2017   IR RADIOLOGIST EVAL & MGMT  02/09/2017   IR RADIOLOGIST EVAL & MGMT  02/21/2017   IR THORACENTESIS ASP PLEURAL SPACE W/IMG GUIDE  12/23/2016    Family History  Problem Relation Age of Onset   Hypertension Mother    Cataracts Mother    Hyperlipidemia Father    Diabetes Father    Pancreatic cancer Maternal Grandmother    Cancer Maternal Grandmother        pancreatic   Diabetes Other    Cancer Maternal Aunt        pancreatic   Heart disease Neg Hx    Stroke Neg Hx      Current Outpatient Medications:    aspirin-acetaminophen-caffeine (EXCEDRIN MIGRAINE) 250-250-65 MG tablet, Take 1 tablet by mouth every 6 (six) hours as needed for headache., Disp: , Rfl:    losartan (COZAAR) 25 MG tablet, TAKE 1 TABLET(25 MG) BY MOUTH DAILY, Disp: 90 tablet, Rfl: 3   metoprolol succinate (TOPROL-XL) 25 MG 24 hr tablet, TAKE 1 TABLET(25 MG) BY MOUTH DAILY WITH OR IMMEDIATELY FOLLOWING A MEAL, Disp: 90 tablet, Rfl: 3   Propylene Glycol (SYSTANE COMPLETE OP), Place 1-2 drops into both eyes See admin instructions. Place 1-2 drops into each eye prior to putting in contacts daily, Disp: ,  Rfl:    tadalafil (CIALIS) 20 MG tablet, Take 0.5-1 tablets (10-20 mg total) by mouth every other day as needed for erectile dysfunction., Disp: 10 tablet, Rfl: 3   terbinafine (LAMISIL) 250 MG tablet, Take 1 tablet (250 mg total) by mouth daily., Disp: 30 tablet, Rfl: 0  No Known Allergies   Review of Systems  Constitutional:  Negative for chills, fever, malaise/fatigue and weight loss.  HENT:  Negative for congestion, ear pain, hearing loss, sore throat and tinnitus.   Eyes:  Negative for blurred vision,  pain and redness.  Respiratory:  Negative for cough, hemoptysis and shortness of breath.   Cardiovascular:  Negative for chest pain, palpitations, orthopnea, claudication and leg swelling.  Gastrointestinal:  Negative for abdominal pain, blood in stool, constipation, diarrhea, nausea and vomiting.  Genitourinary:  Negative for dysuria, flank pain, frequency, hematuria and urgency.  Musculoskeletal:  Negative for falls, joint pain and myalgias.  Skin:  Negative for itching and rash.  Neurological:  Negative for dizziness, tingling, speech change, weakness and headaches.  Endo/Heme/Allergies:  Negative for polydipsia. Does not bruise/bleed easily.  Psychiatric/Behavioral:  Negative for depression and memory loss. The patient is not nervous/anxious and does not have insomnia.         03/22/2023    1:58 PM 04/08/2021    2:52 PM 02/19/2021    9:31 AM 02/17/2020   10:53 AM 04/04/2018   12:12 PM  Depression screen PHQ 2/9  Decreased Interest 0 0 0 0 0  Down, Depressed, Hopeless 0 0 0 0 0  PHQ - 2 Score 0 0 0 0 0        Objective:  BP 130/82   Pulse 60   Ht 5' 5.5" (1.664 m)   Wt 224 lb 3.2 oz (101.7 kg)   BMI 36.74 kg/m   General appearance: alert, no distress, WD/WN, Hispanic male Skin: unremarkable HEENT: normocephalic, conjunctiva/corneas normal, sclerae anicteric, PERRLA, EOMi Neck: supple, no lymphadenopathy, no thyromegaly, no masses, normal ROM, no bruits Chest: non tender, normal shape and expansion Heart: RRR, normal S1, S2, no murmurs Lungs: CTA bilaterally, no wheezes, rhonchi, or rales Abdomen: +bs, soft, RUQ port scars, RLQ diagonal surgical scar, non tender, non distended, no masses, no hepatomegaly, no splenomegaly, no bruits Back: non tender, normal ROM, no scoliosis Musculoskeletal: upper extremities non tender, no obvious deformity, normal ROM throughout, lower extremities non tender, no obvious deformity, normal ROM throughout Extremities: no edema, no cyanosis, no  clubbing Pulses: 2+ symmetric, upper and lower extremities, normal cap refill Neurological: alert, oriented x 3, CN2-12 intact, strength normal upper extremities and lower extremities, sensation normal throughout, DTRs 2+ throughout, no cerebellar signs, gait normal Psychiatric: normal affect, behavior normal, pleasant  GU: normal male external genitalia,uncircumcised, nontender, no masses, no hernia, no lymphadenopathy Rectal: deferred   Assessment and Plan :   Encounter Diagnoses  Name Primary?   Routine general medical examination at a health care facility Yes   OSA on CPAP    Impaired fasting blood sugar    Essential hypertension    Fatty liver      This visit was a preventative care visit, also known as wellness visit or routine physical.   Topics typically include healthy lifestyle, diet, exercise, preventative care, vaccinations, sick and well care, proper use of emergency dept and after hours care, as well as other concerns.     Recommendations: Continue to return yearly for your annual wellness and preventative care visits.  This gives [place] a chance  to discuss healthy lifestyle, exercise, vaccinations, review your chart record, and perform screenings where appropriate.  I recommend you see your eye doctor yearly for routine vision care.  I recommend you see your dentist yearly for routine dental care including hygiene visits twice yearly.   Vaccination recommendations were reviewed Immunization History  Administered Date(s) Administered   Hepatitis A, Adult 02/17/2020, 08/17/2020   Hepb-cpg 02/17/2020, 04/16/2020   Influenza,inj,Quad PF,6+ Mos 11/03/2016, 11/06/2017, 12/11/2018   Tdap 02/17/2020    I recommend pneumonia vaccine.  Declines pneumococcal 23 vaccine. I recommend yearly flu vaccine.  Declines.    Screening for cancer: Colon cancer screening: Age 59, however, given recent concerns, referral to gastroenterology  Testicular cancer screening You should  do a monthly self testicular exam if you are between 78-63 years old  We discussed PSA, prostate exam, and prostate cancer screening risks/benefits.   Age 57  Skin cancer screening: Check your skin regularly for new changes, growing lesions, or other lesions of concern Come in for evaluation if you have skin lesions of concern.  Lung cancer screening: If you have a greater than 20 pack year history of tobacco use, then you may qualify for lung cancer screening with a chest CT scan.   Please call your insurance company to inquire about coverage for this test.  We currently don't have screenings for other cancers besides breast, cervical, colon, and lung cancers.  If you have a strong family history of cancer or have other cancer screening concerns, please let me know.    Bone health: Get at least 150 minutes of aerobic exercise weekly Get weight bearing exercise at least once weekly Bone density test:  A bone density test is an imaging test that uses a type of X-ray to measure the amount of calcium and other minerals in your bones. The test may be used to diagnose or screen you for a condition that causes weak or thin bones (osteoporosis), predict your risk for a broken bone (fracture), or determine how well your osteoporosis treatment is working. The bone density test is recommended for females 65 and older, or females or males <65 if certain risk factors such as thyroid disease, long term use of steroids such as for asthma or rheumatological issues, vitamin D deficiency, estrogen deficiency, family history of osteoporosis, self or family history of fragility fracture in first degree relative.    Heart health: Get at least 150 minutes of aerobic exercise weekly Limit alcohol It is important to maintain a healthy blood pressure and healthy cholesterol numbers  Heart disease screening: Screening for heart disease includes screening for blood pressure, fasting lipids, glucose/diabetes  screening, BMI height to weight ratio, reviewed of smoking status, physical activity, and diet.    Goals include blood pressure 120/80 or less, maintaining a healthy lipid/cholesterol profile, preventing diabetes or keeping diabetes numbers under good control, not smoking or using tobacco products, exercising most days per week or at least 150 minutes per week of exercise, and eating healthy variety of fruits and vegetables, healthy oils, and avoiding unhealthy food choices like fried food, fast food, high sugar and high cholesterol foods.     Medical care options: I recommend you continue to seek care here first for routine care.  We try really hard to have available appointments Monday through Friday daytime hours for sick visits, acute visits, and physicals.  Urgent care should be used for after hours and weekends for significant issues that cannot wait till the next day.  The  emergency department should be used for significant potentially life-threatening emergencies.  The emergency department is expensive, can often have long wait times for less significant concerns, so try to utilize primary care, urgent care, or telemedicine when possible to avoid unnecessary trips to the emergency department.  Virtual visits and telemedicine have been introduced since the pandemic started in 2020, and can be convenient ways to receive medical care.  We offer virtual appointments as well to assist you in a variety of options to seek medical care.    Separate significant issues discussed: Fatty liver disease, obesity Long-term complications can include liver function abnormalities, low platelet count, bleeding, scarring and ultimately liver failure This is treated with healthy diet, low-fat diet and losing weight There are newer medications such as Wegovy and Zepbound injections weekly that can help with weight loss and helping to improve fatty liver disease.   Consider this.  Hypertension-continue current  medications  Sleep apnea-continue CPAP   Riad "Reuel Boom" was seen today for annual exam.  Diagnoses and all orders for this visit:  Routine general medical examination at a health care facility -     Comprehensive metabolic panel -     CBC -     TSH -     Lipid panel -     Hemoglobin A1c -     POCT Urinalysis DIP (Proadvantage Device)  OSA on CPAP  Impaired fasting blood sugar -     Hemoglobin A1c  Essential hypertension -     Comprehensive metabolic panel -     POCT Urinalysis DIP (Proadvantage Device)  Fatty liver -     Comprehensive metabolic panel -     Lipid panel     Follow-up pending labs, yearly for physical

## 2023-03-23 ENCOUNTER — Other Ambulatory Visit: Payer: Self-pay | Admitting: Medical

## 2023-03-23 LAB — COMPREHENSIVE METABOLIC PANEL
ALT: 74 [IU]/L — ABNORMAL HIGH (ref 0–44)
AST: 43 [IU]/L — ABNORMAL HIGH (ref 0–40)
Albumin: 4.6 g/dL (ref 4.1–5.1)
Alkaline Phosphatase: 95 [IU]/L (ref 44–121)
BUN/Creatinine Ratio: 18 (ref 9–20)
BUN: 16 mg/dL (ref 6–24)
Bilirubin Total: 0.4 mg/dL (ref 0.0–1.2)
CO2: 22 mmol/L (ref 20–29)
Calcium: 9.8 mg/dL (ref 8.7–10.2)
Chloride: 102 mmol/L (ref 96–106)
Creatinine, Ser: 0.87 mg/dL (ref 0.76–1.27)
Globulin, Total: 2.9 g/dL (ref 1.5–4.5)
Glucose: 83 mg/dL (ref 70–99)
Potassium: 4.2 mmol/L (ref 3.5–5.2)
Sodium: 139 mmol/L (ref 134–144)
Total Protein: 7.5 g/dL (ref 6.0–8.5)
eGFR: 111 mL/min/{1.73_m2} (ref >59)

## 2023-03-23 LAB — HEMOGLOBIN A1C
Est. average glucose Bld gHb Est-mCnc: 123 mg/dL
Hgb A1c MFr Bld: 5.9 % — ABNORMAL HIGH (ref 4.8–5.6)

## 2023-03-23 LAB — CBC
Hematocrit: 51 % (ref 37.5–51.0)
Hemoglobin: 17.4 g/dL (ref 13.0–17.7)
MCH: 30.5 pg (ref 26.6–33.0)
MCHC: 34.1 g/dL (ref 31.5–35.7)
MCV: 89 fL (ref 79–97)
Platelets: 304 10*3/uL (ref 150–450)
RBC: 5.71 x10E6/uL (ref 4.14–5.80)
RDW: 12.3 % (ref 11.6–15.4)
WBC: 8.9 10*3/uL (ref 3.4–10.8)

## 2023-03-23 LAB — LIPID PANEL
Chol/HDL Ratio: 3.2 {ratio} (ref 0.0–5.0)
Cholesterol, Total: 166 mg/dL (ref 100–199)
HDL: 52 mg/dL (ref >39)
LDL Chol Calc (NIH): 97 mg/dL (ref 0–99)
Triglycerides: 92 mg/dL (ref 0–149)
VLDL Cholesterol Cal: 17 mg/dL (ref 5–40)

## 2023-03-23 LAB — TSH: TSH: 1.71 u[IU]/mL (ref 0.450–4.500)

## 2023-03-23 NOTE — Progress Notes (Signed)
Results sent through MyChart

## 2023-03-31 ENCOUNTER — Other Ambulatory Visit (HOSPITAL_BASED_OUTPATIENT_CLINIC_OR_DEPARTMENT_OTHER): Payer: Self-pay | Admitting: Cardiovascular Disease

## 2023-04-06 ENCOUNTER — Ambulatory Visit: Payer: Commercial Managed Care - PPO | Admitting: Podiatry

## 2023-04-11 ENCOUNTER — Ambulatory Visit (INDEPENDENT_AMBULATORY_CARE_PROVIDER_SITE_OTHER): Payer: Commercial Managed Care - PPO | Admitting: Podiatry

## 2023-04-11 ENCOUNTER — Telehealth: Payer: Self-pay | Admitting: Medical

## 2023-04-11 ENCOUNTER — Encounter: Payer: Self-pay | Admitting: Podiatry

## 2023-04-11 DIAGNOSIS — L603 Nail dystrophy: Secondary | ICD-10-CM

## 2023-04-11 MED ORDER — TERBINAFINE HCL 250 MG PO TABS: 250.0000 mg | ORAL_TABLET | Freq: Every day | ORAL | 0 refills | Status: AC

## 2023-04-11 NOTE — Telephone Encounter (Signed)
 Pt brought by a physical form from his job, he would like it email back to his personal email. Put in [employer] folder

## 2023-04-11 NOTE — Progress Notes (Signed)
 Bobby Kelly presents today for follow-up of his athlete's foot and onychomycosis.  He states that the skin is already looking much better on the foot.  He denies fever chills nausea vomiting muscle aches pains calf pain back pain chest pain shortness of breath.  Objective: Tinea pedis onychomycosis.  Assessment: Long-term therapy Lamisil.  Plan: Discussed etiology pathology conservative versus surgical therapies she will continue Lamisil therapy for another 90 days.  I will follow-up with him in 4 months.  We did request comprehensive metabolic panel.  Should this come back abnormal I will notify him.

## 2023-04-12 ENCOUNTER — Other Ambulatory Visit: Payer: Self-pay | Admitting: Medical

## 2023-04-12 MED ORDER — TADALAFIL 20 MG PO TABS
10.0000 mg | ORAL_TABLET | ORAL | 3 refills | Status: DC | PRN
Start: 2023-04-12 — End: 2023-06-19

## 2023-04-12 NOTE — Telephone Encounter (Signed)
 Form signed & emailed to pt

## 2023-04-13 ENCOUNTER — Telehealth: Payer: Self-pay | Admitting: *Deleted

## 2023-04-13 ENCOUNTER — Other Ambulatory Visit: Payer: Self-pay | Admitting: Medical

## 2023-04-13 ENCOUNTER — Other Ambulatory Visit (HOSPITAL_COMMUNITY): Payer: Self-pay

## 2023-04-13 ENCOUNTER — Telehealth: Payer: Self-pay

## 2023-04-13 LAB — COMPREHENSIVE METABOLIC PANEL
AG Ratio: 1.5 (calc) (ref 1.0–2.5)
ALT: 61 U/L — ABNORMAL HIGH (ref 9–46)
AST: 43 U/L — ABNORMAL HIGH (ref 10–40)
Albumin: 4.5 g/dL (ref 3.6–5.1)
Alkaline phosphatase (APISO): 82 U/L (ref 36–130)
BUN: 15 mg/dL (ref 7–25)
CO2: 26 mmol/L (ref 20–32)
Calcium: 9 mg/dL (ref 8.6–10.3)
Chloride: 101 mmol/L (ref 98–110)
Creat: 0.89 mg/dL (ref 0.60–1.29)
Globulin: 3.1 g/dL (ref 1.9–3.7)
Glucose, Bld: 98 mg/dL (ref 65–139)
Potassium: 3.7 mmol/L (ref 3.5–5.3)
Sodium: 137 mmol/L (ref 135–146)
Total Bilirubin: 0.5 mg/dL (ref 0.2–1.2)
Total Protein: 7.6 g/dL (ref 6.1–8.1)

## 2023-04-13 MED ORDER — ZEPBOUND 2.5 MG/0.5ML ~~LOC~~ SOAJ: 2.5000 mg | SUBCUTANEOUS | 0 refills | Status: DC

## 2023-04-13 MED ORDER — ZEPBOUND 5 MG/0.5ML ~~LOC~~ SOAJ: 5.0000 mg | SUBCUTANEOUS | 0 refills | Status: DC

## 2023-04-13 NOTE — Telephone Encounter (Signed)
-----   Message from Elinor Parkinson sent at 04/13/2023  3:06 PM EST ----- Slightly elevated like last time.

## 2023-04-13 NOTE — Telephone Encounter (Signed)
 Addt info requested on 3.6.25 and sent back on 3.6.25

## 2023-04-13 NOTE — Telephone Encounter (Signed)
 Pharmacy Patient Advocate Encounter   Received notification from CoverMyMeds that prior authorization for Zepbound 2.5MG /0.5ML pen-injectors is required/requested.   Insurance verification completed.   The patient is insured through [facility] .   Per test claim: PA required; PA submitted to above mentioned insurance via Prompt PA Key/confirmation #/[id number]  Status is pending

## 2023-04-17 ENCOUNTER — Other Ambulatory Visit (HOSPITAL_COMMUNITY): Payer: Self-pay

## 2023-04-17 NOTE — Telephone Encounter (Signed)
 Pharmacy Patient Advocate Encounter  Received notification from [employer] that Prior Authorization for  Zepbound 2.5MG /0.5ML pen-injectors  has been APPROVED from 3.6.25 to 9.6.25. Ran test claim, Copay is $RTS , RX WAS LAST FILLED ON 3.7.25. This test claim was processed through [facility]- copay amounts may vary at other pharmacies due to pharmacy/plan contracts, or as the patient moves through the different stages of their insurance plan.

## 2023-06-15 ENCOUNTER — Telehealth: Payer: Self-pay

## 2023-06-15 NOTE — Telephone Encounter (Signed)
 Copied from CRM [id number]. Topic: Clinical - Medication Question >> Jun 15, 2023  3:01 PM Bobby Kelly wrote: Reason for CRM: Patient has two doses left of tirzepatide  (ZEPBOUND ) 5 MG/0.5ML Pen, he doesn't know if he needs to be seen before he has a refill, also doesn't know if he needs to continue with 5mg . Please call to advise.

## 2023-06-19 ENCOUNTER — Ambulatory Visit (INDEPENDENT_AMBULATORY_CARE_PROVIDER_SITE_OTHER): Admitting: Medical

## 2023-06-19 VITALS — BP 116/70 | HR 78 | Ht 65.5 in | Wt 219.0 lb

## 2023-06-19 DIAGNOSIS — I10 Essential (primary) hypertension: Secondary | ICD-10-CM

## 2023-06-19 DIAGNOSIS — K5900 Constipation, unspecified: Secondary | ICD-10-CM | POA: Insufficient documentation

## 2023-06-19 DIAGNOSIS — G4733 Obstructive sleep apnea (adult) (pediatric): Secondary | ICD-10-CM

## 2023-06-19 DIAGNOSIS — Z6835 Body mass index (BMI) 35.0-35.9, adult: Secondary | ICD-10-CM

## 2023-06-19 DIAGNOSIS — N529 Male erectile dysfunction, unspecified: Secondary | ICD-10-CM

## 2023-06-19 DIAGNOSIS — R7301 Impaired fasting glucose: Secondary | ICD-10-CM | POA: Diagnosis not present

## 2023-06-19 DIAGNOSIS — K760 Fatty (change of) liver, not elsewhere classified: Secondary | ICD-10-CM

## 2023-06-19 MED ORDER — TIRZEPATIDE-WEIGHT MANAGEMENT 7.5 MG/0.5ML ~~LOC~~ SOLN
7.5000 mg | SUBCUTANEOUS | 0 refills | Status: DC
Start: 2023-06-19 — End: 2023-06-27

## 2023-06-19 MED ORDER — TADALAFIL 20 MG PO TABS
10.0000 mg | ORAL_TABLET | ORAL | 3 refills | Status: AC | PRN
Start: 2023-06-19 — End: ?

## 2023-06-19 MED ORDER — TIRZEPATIDE-WEIGHT MANAGEMENT 10 MG/0.5ML ~~LOC~~ SOLN: 10.0000 mg | SUBCUTANEOUS | 0 refills | Status: DC

## 2023-06-19 NOTE — Progress Notes (Signed)
 Subjective:  Bobby Kelly is a 43 y.o. male who presents for Chief Complaint  Patient presents with   Weight Check    Med and weight check     Here for follow up on weight loss efforts  Current exercise: 3 days per week at the gym, cardio  Dietary efforts: Working on smaller portions, getting variety of fruits & vegetables  Medication: Zpebound.  He completed 1 month of the 2.5 mg dose and is on his third week of the 5 mg dose  Any side effects of medication: constipation.  He notes that he needs a refill on Cialis .  He cannot seem to get this from the pharmacy.  No other aggravating or relieving factors.    No other c/o.  Past Medical History:  Diagnosis Date   Allergy    CHF (congestive heart failure) (HCC) 2018   Cholelithiasis 2018   cholecystectomy with complication, drain    Dry skin    nasal folds   Hypertension    OSA (obstructive sleep apnea)    Thyroid disease    hyperactive in high school, had oral therapy.     Wears contact lenses     Current Outpatient Medications on File Prior to Visit  Medication Sig Dispense Refill   aspirin-acetaminophen -caffeine (EXCEDRIN MIGRAINE) 250-250-65 MG tablet Take 1 tablet by mouth every 6 (six) hours as needed for headache.     losartan  (COZAAR ) 25 MG tablet TAKE 1 TABLET(25 MG) BY MOUTH DAILY 90 tablet 3   metoprolol  succinate (TOPROL -XL) 25 MG 24 hr tablet TAKE 1 TABLET(25 MG) BY MOUTH DAILY WITH OR IMMEDIATELY FOLLOWING A MEAL 90 tablet 1   Propylene Glycol (SYSTANE COMPLETE OP) Place 1-2 drops into both eyes See admin instructions. Place 1-2 drops into each eye prior to putting in contacts daily     tirzepatide  (ZEPBOUND ) 5 MG/0.5ML Pen Inject 5 mg into the skin once a week. 2 mL 0   terbinafine  (LAMISIL ) 250 MG tablet Take 1 tablet (250 mg total) by mouth daily. (Patient not taking: Reported on 06/19/2023) 90 tablet 0   No current facility-administered medications on file prior to visit.    The following portions of  the patient's history were reviewed and updated as appropriate: allergies, current medications, past family history, past medical history, past social history, past surgical history and problem list.  ROS Otherwise as in subjective above   Objective: BP 116/70   Pulse 78   Ht 5' 5.5" (1.664 m)   Wt 219 lb (99.3 kg)   SpO2 98%   BMI 35.89 kg/m   General appearance: alert, no distress, well developed, well nourished Heart: RRR, normal S1, S2, no murmurs Lungs: CTA bilaterally, no wheezes, rhonchi, or rales Abdomen: +bs, soft, non tender, non distended, no masses, no hepatomegaly, no splenomegaly Pulses: 2+ radial pulses, 2+ pedal pulses, normal cap refill Ext: no edema   Assessment: Encounter Diagnoses  Name Primary?   Essential hypertension Yes   Fatty liver    Impaired fasting blood sugar    OSA on CPAP    BMI 35.0-35.9,adult    Morbid obesity (HCC)    Constipation, unspecified constipation type    Erectile dysfunction, unspecified erectile dysfunction type      Plan: Hypertension-continue metoprolol  XL 25 mg daily and losartan  25 mg daily.  If you are able to experience improvement with weight loss efforts, monitor the blood pressures as we will have to lower a dose or modify your regimen if your blood pressure  drops over the next few months.  Obesity, BMI of 35 or greater-finish out the 5 mg Zepbound  and then increase to the 7.5 mg weekly for 1 month, followed by 10 mg weekly for 1 month and recheck.  Continue efforts with healthy diet and exercise.  Be careful with portion size and avoid heavy meals such as heavy meat servings or cheese.  Impaired glucose-continue efforts to lifestyle changes and weight loss  Constipation-can use MiraLAX  over-the-counter as needed as discussed.  You can use this daily or as needed  Fatty liver-continue efforts at healthy lifestyle and weight loss  Erectile dysfunction-he had refills of Cialis  already.  I will resend the medication  today but he will let me know if there is any issue at the pharmacy today  Bobby "Bearl Limes" was seen today for weight check.  Diagnoses and all orders for this visit:  Essential hypertension  Fatty liver  Impaired fasting blood sugar  OSA on CPAP  BMI 35.0-35.9,adult  Morbid obesity (HCC)  Constipation, unspecified constipation type  Erectile dysfunction, unspecified erectile dysfunction type  Other orders -     tirzepatide  7.5 MG/0.5ML injection vial; Inject 7.5 mg into the skin once a week. -     tirzepatide  10 MG/0.5ML injection vial; Inject 10 mg into the skin once a week. -     tadalafil  (CIALIS ) 20 MG tablet; Take 0.5-1 tablets (10-20 mg total) by mouth every other day as needed for erectile dysfunction.    Follow up: 75mo

## 2023-06-20 ENCOUNTER — Telehealth: Payer: Self-pay | Admitting: Pharmacy Technician

## 2023-06-20 ENCOUNTER — Other Ambulatory Visit (HOSPITAL_COMMUNITY): Payer: Self-pay

## 2023-06-20 NOTE — Telephone Encounter (Signed)
 Pharmacy Patient Advocate Encounter   Received notification from Onbase that prior authorization for ZEPBOUND  7.5 MG is required/requested.   Insurance verification completed.   The patient is insured through [facility] .   Per test claim: PA required; PA submitted to above mentioned insurance via CoverMyMeds Key/confirmation #/[id number] Status is pending

## 2023-06-21 ENCOUNTER — Other Ambulatory Visit (HOSPITAL_COMMUNITY): Payer: Self-pay

## 2023-06-22 ENCOUNTER — Other Ambulatory Visit (HOSPITAL_COMMUNITY): Payer: Self-pay

## 2023-06-22 NOTE — Telephone Encounter (Signed)
 Pharmacy Patient Advocate Encounter  Received notification from [employer] that Prior Authorization for ZEPBOUND  7.5 MG has been APPROVED   PA #/Case ID/Reference #: [id number]

## 2023-06-26 ENCOUNTER — Other Ambulatory Visit: Payer: Self-pay | Admitting: Medical

## 2023-06-26 NOTE — Telephone Encounter (Unsigned)
 Copied from CRM [id number]. Topic: Clinical - Medication Refill >> Jun 26, 2023 10:26 AM Dimple Francis wrote: Medication: tirzepatide  7.5 MG/0.5ML injection vial  Has the patient contacted their pharmacy? Yes (Agent: If no, request that the patient contact the pharmacy for the refill. If patient does not wish to contact the pharmacy document the reason why and proceed with request.) (Agent: If yes, when and what did the pharmacy advise?)  This is the patient's preferred pharmacy:  [id number] - [place], [place] - [address] AT [facility] [address] [place]  [address] Phone: [phone] Fax: [phone]  Is this the correct pharmacy for this prescription? Yes If no, delete pharmacy and type the correct one.   Has the prescription been filled recently? Yes  Is the patient out of the medication? Yes  Has the patient been seen for an appointment in the last year OR does the patient have an upcoming appointment? Yes  Can we respond through MyChart? Yes  Agent: Please be advised that Rx refills may take up to 3 business days. We ask that you follow-up with your pharmacy.

## 2023-06-27 MED ORDER — ZEPBOUND 10 MG/0.5ML ~~LOC~~ SOAJ: 10.0000 mg | SUBCUTANEOUS | 1 refills | Status: DC

## 2023-06-27 MED ORDER — ZEPBOUND 7.5 MG/0.5ML ~~LOC~~ SOAJ: 7.5000 mg | SUBCUTANEOUS | 0 refills | Status: DC

## 2023-06-27 NOTE — Telephone Encounter (Signed)
 Looks like zepbound  7.5mg  and 10mg  was sent in as vials and not pens. I have resent in this as pens and notified patient

## 2023-08-21 ENCOUNTER — Ambulatory Visit (INDEPENDENT_AMBULATORY_CARE_PROVIDER_SITE_OTHER): Admitting: Medical

## 2023-08-21 VITALS — BP 116/68 | HR 83 | Ht 65.5 in | Wt 198.4 lb

## 2023-08-21 DIAGNOSIS — I10 Essential (primary) hypertension: Secondary | ICD-10-CM

## 2023-08-21 DIAGNOSIS — K760 Fatty (change of) liver, not elsewhere classified: Secondary | ICD-10-CM | POA: Diagnosis not present

## 2023-08-21 DIAGNOSIS — Z6832 Body mass index (BMI) 32.0-32.9, adult: Secondary | ICD-10-CM | POA: Insufficient documentation

## 2023-08-21 DIAGNOSIS — R7301 Impaired fasting glucose: Secondary | ICD-10-CM | POA: Diagnosis not present

## 2023-08-21 DIAGNOSIS — G4733 Obstructive sleep apnea (adult) (pediatric): Secondary | ICD-10-CM

## 2023-08-21 MED ORDER — ZEPBOUND 12.5 MG/0.5ML ~~LOC~~ SOAJ: 12.5000 mg | SUBCUTANEOUS | 0 refills | Status: DC

## 2023-08-21 MED ORDER — METOPROLOL SUCCINATE ER 25 MG PO TB24: 25.0000 mg | ORAL_TABLET | Freq: Every day | ORAL | 1 refills | Status: AC

## 2023-08-21 MED ORDER — ZEPBOUND 15 MG/0.5ML ~~LOC~~ SOAJ: 15.0000 mg | SUBCUTANEOUS | 3 refills | Status: DC

## 2023-08-21 NOTE — Patient Instructions (Signed)
 Hypertension-continue metoprolol  XL 25 mg daily but start cutting the losartan  25 mg tablet in half.  Do 1/2 tablet of losartan  daily.  Monitor blood pressures at least twice weekly.  If your blood pressure is regularly less than 110/66 , then we would just stop the losartan  and continue metoprolol .  Obesity, BMI of 32 or greater-increase to Zepbound  12.5 mg weekly for the next month.  After 1 month go up to the highest dose of 15 mg.  We will continue at the 15 mg from that point for a while.   continue efforts with healthy diet and exercise.  Be careful with portion size and avoid heavy meals such as heavy meat servings or cheese.  Impaired glucose-continue efforts to lifestyle changes and weight loss  Fatty liver-continue efforts at healthy lifestyle and weight loss  Sleep apnea-continue CPAP for now.  If you lose at least 10 more pounds in the next month or 2-3, we may need to consider backing off CPAP

## 2023-08-21 NOTE — Addendum Note (Signed)
 Addended by: BULAH ALM RAMAN on: 08/21/2023 09:13 AM   Modules accepted: Level of Service

## 2023-08-21 NOTE — Progress Notes (Signed)
 Subjective:  Bobby Kelly is a 43 y.o. male who presents for Chief Complaint  Patient presents with   Follow-up    1 month follow-up on weight.      Here for follow up on weight loss efforts and medication  Current exercise: Running regularly, goes to gym 3 days per week  Dietary efforts: Working on smaller portions, getting variety of fruits & vegetables, limiting bread.  More protein of late.  Eats on average breakfast and lunch, not really dinner.     Medication: Zpebound.  He just finished the 10 mg dose  Any side effects of medication: sometimes bloating , cramping  HTN -he continues on losartan  and Toprol  as usual.   Sometimes feels a little lightheaded.   No other aggravating or relieving factors.    No other c/o.  Past Medical History:  Diagnosis Date   Allergy    CHF (congestive heart failure) (HCC) 2018   Cholelithiasis 2018   cholecystectomy with complication, drain    Dry skin    nasal folds   Hypertension    OSA (obstructive sleep apnea)    Thyroid disease    hyperactive in high school, had oral therapy.     Wears contact lenses     Current Outpatient Medications on File Prior to Visit  Medication Sig Dispense Refill   aspirin-acetaminophen -caffeine (EXCEDRIN MIGRAINE) 250-250-65 MG tablet Take 1 tablet by mouth every 6 (six) hours as needed for headache.     losartan  (COZAAR ) 25 MG tablet TAKE 1 TABLET(25 MG) BY MOUTH DAILY 90 tablet 3   Propylene Glycol (SYSTANE COMPLETE OP) Place 1-2 drops into both eyes See admin instructions. Place 1-2 drops into each eye prior to putting in contacts daily     tadalafil  (CIALIS ) 20 MG tablet Take 0.5-1 tablets (10-20 mg total) by mouth every other day as needed for erectile dysfunction. 10 tablet 3   terbinafine  (LAMISIL ) 250 MG tablet Take 1 tablet (250 mg total) by mouth daily. 90 tablet 0   No current facility-administered medications on file prior to visit.    The following portions of the patient's history  were reviewed and updated as appropriate: allergies, current medications, past family history, past medical history, past social history, past surgical history and problem list.  ROS Otherwise as in subjective above    Objective: BP 116/68   Pulse 83   Ht 5' 5.5 (1.664 m)   Wt 198 lb 6.4 oz (90 kg)   SpO2 100%   BMI 32.51 kg/m   Wt Readings from Last 3 Encounters:  08/21/23 198 lb 6.4 oz (90 kg)  06/19/23 219 lb (99.3 kg)  03/22/23 224 lb 3.2 oz (101.7 kg)   BP Readings from Last 3 Encounters:  08/21/23 116/68  06/19/23 116/70  03/22/23 130/82    General appearance: alert, no distress, well developed, well nourished Heart: RRR, normal S1, S2, no murmurs Lungs: CTA bilaterally, no wheezes, rhonchi, or rales Abdomen: +bs, soft, non tender, non distended, no masses, no hepatomegaly, no splenomegaly Pulses: 2+ radial pulses, 2+ pedal pulses, normal cap refill Ext: no edema    Assessment: Encounter Diagnoses  Name Primary?   Impaired fasting blood sugar Yes   Essential hypertension    Fatty liver    OSA on CPAP    BMI 32.0-32.9,adult       Plan: Hypertension-continue metoprolol  XL 25 mg daily but start cutting the losartan  25 mg tablet in half.  Do 1/2 tablet of losartan  daily.  Monitor  blood pressures at least twice weekly.  If your blood pressure is regularly less than 110/66 , then we would just stop the losartan  and continue metoprolol .  Obesity, BMI of 32 or greater-increase to Zepbound  12.5 mg weekly for the next month.  After 1 month go up to the highest dose of 15 mg.  We will continue at the 15 mg from that point for a while.   continue efforts with healthy diet and exercise.  Be careful with portion size and avoid heavy meals such as heavy meat servings or cheese.  Impaired glucose-continue efforts to lifestyle changes and weight loss  Fatty liver-continue efforts at healthy lifestyle and weight loss  Sleep apnea-continue CPAP for now.  If you lose at  least 10 more pounds in the next month or 2-3, we may need to consider backing off CPAP   Bobby Kelly was seen today for follow-up.  Diagnoses and all orders for this visit:  Impaired fasting blood sugar  Essential hypertension  Fatty liver  OSA on CPAP  BMI 32.0-32.9,adult  Other orders -     tirzepatide  (ZEPBOUND ) 12.5 MG/0.5ML Pen; Inject 12.5 mg into the skin once a week. -     tirzepatide  (ZEPBOUND ) 15 MG/0.5ML Pen; Inject 15 mg into the skin once a week. -     metoprolol  succinate (TOPROL -XL) 25 MG 24 hr tablet; Take 1 tablet (25 mg total) by mouth daily.    Follow up: 3 mo

## 2023-09-20 ENCOUNTER — Telehealth: Payer: Self-pay | Admitting: Internal Medicine

## 2023-09-20 ENCOUNTER — Other Ambulatory Visit: Payer: Self-pay | Admitting: Medical

## 2023-09-20 MED ORDER — ZEPBOUND 15 MG/0.5ML ~~LOC~~ SOAJ: 15.0000 mg | SUBCUTANEOUS | 3 refills | Status: DC

## 2023-09-20 NOTE — Telephone Encounter (Signed)
 Copied from CRM #[id number]. Topic: Clinical - Prescription Issue >> Sep 20, 2023  2:32 PM Graeme ORN wrote: Reason for CRM: Patient called. States he went to get tirzepatide  (ZEPBOUND ) 15 MG/0.5ML Pen. Was told Rx missing Diagnosis. They were also suppose to fax request. Checking to see if it can be added. Next dose due Sunday. Thank You

## 2023-09-20 NOTE — Telephone Encounter (Signed)
 Resent in medication and attached the dx with the medication

## 2023-10-23 ENCOUNTER — Emergency Department (HOSPITAL_BASED_OUTPATIENT_CLINIC_OR_DEPARTMENT_OTHER)

## 2023-10-23 ENCOUNTER — Other Ambulatory Visit (HOSPITAL_BASED_OUTPATIENT_CLINIC_OR_DEPARTMENT_OTHER): Payer: Self-pay | Admitting: Cardiovascular Disease

## 2023-10-23 ENCOUNTER — Emergency Department (HOSPITAL_BASED_OUTPATIENT_CLINIC_OR_DEPARTMENT_OTHER)
Admission: EM | Admit: 2023-10-23 | Discharge: 2023-10-23 | Disposition: A | Discharge status: Home / Self Care | Attending: Emergency Medicine | Admitting: Emergency Medicine

## 2023-10-23 ENCOUNTER — Other Ambulatory Visit: Payer: Self-pay

## 2023-10-23 ENCOUNTER — Encounter (HOSPITAL_BASED_OUTPATIENT_CLINIC_OR_DEPARTMENT_OTHER): Payer: Self-pay | Admitting: *Deleted

## 2023-10-23 DIAGNOSIS — R0602 Shortness of breath: Secondary | ICD-10-CM | POA: Insufficient documentation

## 2023-10-23 DIAGNOSIS — I110 Hypertensive heart disease with heart failure: Secondary | ICD-10-CM | POA: Diagnosis not present

## 2023-10-23 DIAGNOSIS — R7401 Elevation of levels of liver transaminase levels: Secondary | ICD-10-CM | POA: Insufficient documentation

## 2023-10-23 DIAGNOSIS — I509 Heart failure, unspecified: Secondary | ICD-10-CM | POA: Diagnosis not present

## 2023-10-23 DIAGNOSIS — R109 Unspecified abdominal pain: Secondary | ICD-10-CM | POA: Insufficient documentation

## 2023-10-23 LAB — COMPREHENSIVE METABOLIC PANEL WITH GFR
ALT: 119 U/L — ABNORMAL HIGH (ref 0–44)
AST: 63 U/L — ABNORMAL HIGH (ref 15–41)
Albumin: 4.8 g/dL (ref 3.5–5.0)
Alkaline Phosphatase: 118 U/L (ref 38–126)
Anion gap: 14 (ref 5–15)
BUN: 19 mg/dL (ref 6–20)
CO2: 23 mmol/L (ref 22–32)
Calcium: 9.7 mg/dL (ref 8.9–10.3)
Chloride: 105 mmol/L (ref 98–111)
Creatinine, Ser: 1.13 mg/dL (ref 0.61–1.24)
GFR, Estimated: >60 mL/min (ref >60)
Glucose, Bld: 112 mg/dL — ABNORMAL HIGH (ref 70–99)
Potassium: 3.4 mmol/L — ABNORMAL LOW (ref 3.5–5.1)
Sodium: 142 mmol/L (ref 135–145)
Total Bilirubin: 0.4 mg/dL (ref 0.0–1.2)
Total Protein: 7.7 g/dL (ref 6.5–8.1)

## 2023-10-23 LAB — RESP PANEL BY RT-PCR (RSV, FLU A&B, COVID)  RVPGX2
Influenza A by PCR: NEGATIVE
Influenza B by PCR: NEGATIVE
Resp Syncytial Virus by PCR: NEGATIVE
SARS Coronavirus 2 by RT PCR: NEGATIVE

## 2023-10-23 LAB — CBC
HCT: 48.2 % (ref 39.0–52.0)
Hemoglobin: 16.7 g/dL (ref 13.0–17.0)
MCH: 31.2 pg (ref 26.0–34.0)
MCHC: 34.6 g/dL (ref 30.0–36.0)
MCV: 90.1 fL (ref 80.0–100.0)
Platelets: 295 K/uL (ref 150–400)
RBC: 5.35 MIL/uL (ref 4.22–5.81)
RDW: 13.2 % (ref 11.5–15.5)
WBC: 8.7 K/uL (ref 4.0–10.5)
nRBC: 0 % (ref 0.0–0.2)

## 2023-10-23 LAB — PRO BRAIN NATRIURETIC PEPTIDE: Pro Brain Natriuretic Peptide: <50 pg/mL (ref <300.0)

## 2023-10-23 LAB — LIPASE, BLOOD: Lipase: 69 U/L — ABNORMAL HIGH (ref 11–51)

## 2023-10-23 LAB — TROPONIN T, HIGH SENSITIVITY: Troponin T High Sensitivity: <15 ng/L (ref 0–19)

## 2023-10-23 NOTE — Discharge Instructions (Signed)
You were evaluated in the Emergency Department and after careful evaluation, we did not find any emergent condition requiring admission or further testing in the hospital.  Your exam/testing today was overall reassuring.  Please return to the Emergency Department if you experience any worsening of your condition.  Thank you for allowing us to be a part of your care.  

## 2023-10-23 NOTE — ED Triage Notes (Addendum)
 Pt present with shortness of breath, dental pain and abdominal pain - Onset:Sunday - Progressively worsen over the day and night. Did research on internet .  Pt stated  [facility]  and other symptoms related to Dental Pain. (Left lower) Scheduled Root canel for today ( 9am) Came for evaluate for questionable sepsis. No Hx- Asthma.

## 2023-10-23 NOTE — ED Provider Notes (Signed)
 [facility]EM[facility]ENC[facility]PT [facility] Emergency Department Provider Note MRN:  [id number]  Arrival date & time: 10/23/23     Chief Complaint   Shortness of Breath   History of Present Illness   Bobby Kelly is a 43 y.o. year-old male with a history of CHF presenting to the ED with chief complaint of shortness of breath.  Feeling generally unwell today.  Has a root canal scheduled for tomorrow.  Was feeling short of breath when laying flat this evening, here for evaluation.  Did some online research and was concerned.  Was having some abdominal pain earlier today.  Was having some chest pain a few days ago.  Review of Systems  A thorough review of systems was obtained and all systems are negative except as noted in the HPI and PMH.   Patient's Health History    Past Medical History:  Diagnosis Date   Allergy    CHF (congestive heart failure) (HCC) 2018   Cholelithiasis 2018   cholecystectomy with complication, drain    Dry skin    nasal folds   Hypertension    OSA (obstructive sleep apnea)    Thyroid disease    hyperactive in high school, had oral therapy.     Wears contact lenses     Past Surgical History:  Procedure Laterality Date   APPENDECTOMY     CHOLECYSTECTOMY N/A 12/03/2016   Procedure: LAPAROSCOPIC CHOLECYSTECTOMY;  Surgeon: Sebastian Moles, MD;  Location: [facility] OR;  Service: General;  Laterality: N/A;   ERCP N/A 12/19/2016   Procedure: ENDOSCOPIC RETROGRADE CHOLANGIOPANCREATOGRAPHY (ERCP);  Surgeon: Rosalie Kitchens, MD;  Location: [facility] ENDOSCOPY;  Service: Endoscopy;  Laterality: N/A;   ESOPHAGOGASTRODUODENOSCOPY (EGD) WITH PROPOFOL  N/A 12/13/2016   Procedure: ESOPHAGOGASTRODUODENOSCOPY (EGD) WITH PROPOFOL ;  Surgeon: Burnette Fallow, MD;  Location: [facility] ENDOSCOPY;  Service: Endoscopy;  Laterality: N/A;   ESOPHAGOGASTRODUODENOSCOPY (EGD) WITH PROPOFOL  N/A 12/15/2016   Procedure: ESOPHAGOGASTRODUODENOSCOPY (EGD) WITH PROPOFOL ;  Surgeon: Burnette Fallow, MD;  Location: [facility]  ENDOSCOPY;  Service: Endoscopy;  Laterality: N/A;   ESOPHAGOGASTRODUODENOSCOPY (EGD) WITH PROPOFOL  N/A 04/11/2017   Procedure: ESOPHAGOGASTRODUODENOSCOPY (EGD) WITH PROPOFOL ;  Surgeon: Rosalie Kitchens, MD;  Location: [facility] ENDOSCOPY;  Service: Endoscopy;  Laterality: N/A;  side viewing scope- biliary stent removal   IR CATHETER TUBE CHANGE  12/23/2016   IR RADIOLOGIST EVAL & MGMT  01/03/2017   IR RADIOLOGIST EVAL & MGMT  01/17/2017   IR RADIOLOGIST EVAL & MGMT  01/25/2017   IR RADIOLOGIST EVAL & MGMT  02/09/2017   IR RADIOLOGIST EVAL & MGMT  02/21/2017   IR THORACENTESIS ASP PLEURAL SPACE W/IMG GUIDE  12/23/2016    Family History  Problem Relation Age of Onset   Hypertension Mother    Cataracts Mother    Hyperlipidemia Father    Diabetes Father    Pancreatic cancer Maternal Grandmother    Cancer Maternal Grandmother        pancreatic   Diabetes Other    Cancer Maternal Aunt        pancreatic   Heart disease Neg Hx    Stroke Neg Hx     Social History   Socioeconomic History   Marital status: Married    Spouse name: Not on file   Number of children: Not on file   Years of education: Not on file   Highest education level: Not on file  Occupational History   Not on file  Tobacco Use   Smoking status: Never   Smokeless tobacco: Never  Vaping Use  Vaping status: Never Used  Substance and Sexual Activity   Alcohol use: Yes    Alcohol/week: 2.0 standard drinks of alcohol    Types: 1 Cans of beer, 1 Shots of liquor per week    Comment: 2-3 times a week, history of 2-3 years of heavier alcohol   Drug use: No   Sexual activity: Not on file  Other Topics Concern   Not on file  Social History Narrative   Lives at home with wife, 2 children.  Exercises some. 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 03/2023   Social Drivers of Health   Financial Resource Strain: Low Risk  (03/22/2023)   Overall Financial Resource Strain (CARDIA)    Difficulty of Paying Living Expenses: Not very hard  Food  Insecurity: No Food Insecurity (03/22/2023)   Hunger Vital Sign    Worried About Running Out of Food in the Last Year: Never true    Ran Out of Food in the Last Year: Never true  Transportation Needs: No Transportation Needs (03/22/2023)   PRAPARE - Administrator, Civil Service (Medical): No    Lack of Transportation (Non-Medical): No  Physical Activity: Insufficiently Active (03/22/2023)   Exercise Vital Sign    Days of Exercise per Week: 2 days    Minutes of Exercise per Session: 40 min  Stress: Stress Concern Present (03/22/2023)   [employer] of Occupational Health - Occupational Stress Questionnaire    Feeling of Stress : To some extent  Social Connections: Moderately Integrated (03/22/2023)   Social Connection and Isolation Panel    Frequency of Communication with Friends and Family: Never    Frequency of Social Gatherings with Friends and Family: Once a week    Attends Religious Services: More than 4 times per year    Active Member of [employer] or Organizations: Yes    Attends Engineer, structural: More than 4 times per year    Marital Status: Married  Catering manager Violence: Not At Risk (03/22/2023)   Humiliation, Afraid, Rape, and Kick questionnaire    Fear of Current or Ex-Partner: No    Emotionally Abused: No    Physically Abused: No    Sexually Abused: No     Physical Exam   Vitals:   10/23/23 0139  BP: (!) 135/92  Pulse: 74  Resp: 20  Temp: 97.9 F (36.6 C)  SpO2: 100%    CONSTITUTIONAL: Well-appearing, NAD NEURO/PSYCH:  Alert and oriented x 3, no focal deficits EYES:  eyes equal and reactive ENT/NECK:  no LAD, no JVD CARDIO: Regular rate, well-perfused, normal S1 and S2 PULM:  CTAB no wheezing or rhonchi GI/GU:  non-distended, non-tender MSK/SPINE:  No gross deformities, no edema SKIN:  no rash, atraumatic   *Additional and/or pertinent findings included in MDM below  Diagnostic and Interventional Summary    EKG  Interpretation Date/Time:  Monday October 23 2023 01:57:34 EDT Ventricular Rate:  66 PR Interval:  210 QRS Duration:  94 QT Interval:  430 QTC Calculation: 451 R Axis:   71  Text Interpretation: Sinus rhythm Prolonged PR interval Confirmed by Theadore Sharper [phone]) on 10/23/2023 2:37:23 AM       Labs Reviewed  COMPREHENSIVE METABOLIC PANEL WITH GFR - Abnormal; Notable for the following components:      Result Value   Potassium 3.4 (*)    Glucose, Bld 112 (*)    AST 63 (*)    ALT 119 (*)    All other components within normal  limits  LIPASE, BLOOD - Abnormal; Notable for the following components:   Lipase 69 (*)    All other components within normal limits  RESP PANEL BY RT-PCR (RSV, FLU A&B, COVID)  RVPGX2  CBC  PRO BRAIN NATRIURETIC PEPTIDE  TROPONIN T, HIGH SENSITIVITY    DG Chest Port 1 View  Final Result      Medications - No data to display   Procedures  /  Critical Care Procedures  ED Course and Medical Decision Making  Initial Impression and Ddx Patient is well-appearing in no acute distress.  Normal vitals.  Abdomen soft and nontender, lungs clear.  No fever.  Patient had some concerns about sepsis given that he has an ongoing dental infection.  No signs of that on my initial impression.  Also having some chest pain recently, abdominal pain, malaise, fatigue.  Overall favoring nonemergent process such as anxiety related to his upcoming root canal however other considerations include viral illness, ACS, CHF exacerbation.  Past medical/surgical history that increases complexity of ED encounter: CHF  Interpretation of Diagnostics I personally reviewed the EKG and my interpretation is as follows: Sinus rhythm without concerning ischemic findings  No significant blood count or electrolyte disturbance.  Mildly elevated LFTs.  Troponin negative, D-dimer negative  Patient Reassessment and Ultimate Disposition/Management     Patient continues to sit comfortably in  no acute distress with normal vital signs.  No tachycardia, no hypoxia, no increased work of breathing.  Highly doubt PE or other emergent process.  Patient is appropriate for discharge.  Patient management required discussion with the following services or consulting groups:  None  Complexity of Problems Addressed Acute illness or injury that poses threat of life of bodily function  Additional Data Reviewed and Analyzed Further history obtained from: None  Additional Factors Impacting ED Encounter Risk None  Ozell HERO. Theadore, MD [facility] Emergency Medicine [facility] [email or web address]  Final Clinical Impressions(s) / ED Diagnoses     ICD-10-CM   1. SOB (shortness of breath)  R06.02       ED Discharge Orders     None        Discharge Instructions Discussed with and Provided to Patient:     Discharge Instructions      You were evaluated in the Emergency Department and after careful evaluation, we did not find any emergent condition requiring admission or further testing in the hospital.  Your exam/testing today was overall reassuring.  Please return to the Emergency Department if you experience any worsening of your condition.  Thank you for allowing us  to be a part of your care.        Theadore Ozell HERO, MD 10/23/23 [phone]

## 2023-11-24 ENCOUNTER — Ambulatory Visit: Admitting: Medical

## 2023-11-24 VITALS — BP 122/72 | HR 82 | Ht 65.5 in | Wt 179.6 lb

## 2023-11-24 DIAGNOSIS — K760 Fatty (change of) liver, not elsewhere classified: Secondary | ICD-10-CM

## 2023-11-24 DIAGNOSIS — G4733 Obstructive sleep apnea (adult) (pediatric): Secondary | ICD-10-CM

## 2023-11-24 DIAGNOSIS — I10 Essential (primary) hypertension: Secondary | ICD-10-CM | POA: Diagnosis not present

## 2023-11-24 DIAGNOSIS — R7301 Impaired fasting glucose: Secondary | ICD-10-CM

## 2023-11-24 NOTE — Progress Notes (Signed)
 Subjective:  Bobby Kelly is a 43 y.o. male who presents for Chief Complaint  Patient presents with   Follow-up    Follow-up on weight and blood pressure     Here for follow up on weight loss efforts and medication check.  Bobby Kelly is a 43 year old male with obesity and hypertension  He experiences intermittent bloating that lasts about a day and then resolves. He is on the highest dose of Zepbound  (15 mg) for weight loss.  He is actively managing his weight and has successfully reduced it from 215 pounds to 179 pounds, with a current BMI of 29. He exercises regularly, attending the gym three times a week and running two miles on alternate days.  He is on losartan  25 mg and Toprol  XL 25 mg for hypertension. His blood pressure has been recorded as low as 105/59 mmHg. He has experienced lightheadedness and dizziness in the past, particularly when standing up quickly, but these symptoms have subsided. He monitors his blood pressure regularly.  He uses a CPAP machine for sleep apnea, with a current pressure setting of 13, reduced from 16 due to discomfort. He wakes up with a lot of gas. His last sleep study was in 2019 when he weighed 215 pounds.  Medication: Zepbound  15mg    No other aggravating or relieving factors.    No other c/o.  Past Medical History:  Diagnosis Date   Allergy    CHF (congestive heart failure) (HCC) 2018   Cholelithiasis 2018   cholecystectomy with complication, drain    Dry skin    nasal folds   Hypertension    OSA (obstructive sleep apnea)    Thyroid disease    hyperactive in high school, had oral therapy.     Wears contact lenses     Current Outpatient Medications on File Prior to Visit  Medication Sig Dispense Refill   aspirin-acetaminophen -caffeine (EXCEDRIN MIGRAINE) 250-250-65 MG tablet Take 1 tablet by mouth every 6 (six) hours as needed for headache.     losartan  (COZAAR ) 25 MG tablet TAKE 1 TABLET(25 MG) BY MOUTH DAILY 90 tablet 3    metoprolol  succinate (TOPROL -XL) 25 MG 24 hr tablet Take 1 tablet (25 mg total) by mouth daily. 90 tablet 1   Propylene Glycol (SYSTANE COMPLETE OP) Place 1-2 drops into both eyes See admin instructions. Place 1-2 drops into each eye prior to putting in contacts daily     tadalafil  (CIALIS ) 20 MG tablet Take 0.5-1 tablets (10-20 mg total) by mouth every other day as needed for erectile dysfunction. 10 tablet 3   tirzepatide  (ZEPBOUND ) 15 MG/0.5ML Pen Inject 15 mg into the skin once a week. 2 mL 3   terbinafine  (LAMISIL ) 250 MG tablet Take 1 tablet (250 mg total) by mouth daily. (Patient not taking: Reported on 11/24/2023) 90 tablet 0   No current facility-administered medications on file prior to visit.    The following portions of the patient's history were reviewed and updated as appropriate: allergies, current medications, past family history, past medical history, past social history, past surgical history and problem list.  ROS Otherwise as in subjective above    Objective: BP 122/72   Pulse 82   Ht 5' 5.5 (1.664 m)   Wt 179 lb 9.6 oz (81.5 kg)   SpO2 98%   BMI 29.43 kg/m   Wt Readings from Last 3 Encounters:  11/24/23 179 lb 9.6 oz (81.5 kg)  10/23/23 185 lb (83.9 kg)  08/21/23 198 lb  6.4 oz (90 kg)   BP Readings from Last 3 Encounters:  11/24/23 122/72  10/23/23 112/67  08/21/23 116/68    General appearance: alert, no distress, well developed, well nourished Heart: RRR, normal S1, S2, no murmurs Lungs: CTA bilaterally, no wheezes, rhonchi, or rales    Assessment: Encounter Diagnoses  Name Primary?   Essential hypertension Yes   Fatty liver    Impaired fasting blood sugar    Morbid obesity (HCC)    OSA on CPAP        Plan:  Morbid obesity with HTN and impaired glucose, fatty liver Weight loss progressing with Zepbound , exercise, and dietary changes. Further weight loss may improve BMI and reduce antihypertensive and CPAP needs. - Continue Zepbound  15  mg.  He may want to lower dose going forward. - Encourage exercise and dietary modifications. - Discuss potential Zepbound  dose reduction if insurance changes or desired.  Gastrointestinal bloating secondary to Zepbound  Bloating likely due to Zepbound  slowing digestion. - Advise dietary modifications: smaller portions, avoid cheese, large meat servings, pinto beans, onions.  Essential hypertension Discussed potential to discontinue losartan  with further weight loss and low blood pressure. - Monitor blood pressure regularly. - Consider discontinuing losartan  if blood pressure remains below 110 systolic and further weight loss occurs.  Obstructive sleep apnea CPAP pressure reduced due to discomfort. Significant weight loss since 2019 may affect CPAP need. - Consider repeat sleep study with further weight loss. - Evaluate CPAP need with additional weight loss and symptom changes  Impaired glucose-continue efforts to lifestyle changes and weight loss  Fatty liver-continue efforts at healthy lifestyle and weight loss   Bobby Kelly was seen today for follow-up.  Diagnoses and all orders for this visit:  Essential hypertension  Fatty liver  Impaired fasting blood sugar  Morbid obesity (HCC)  OSA on CPAP     Follow up: call report 1 mo

## 2024-03-07 ENCOUNTER — Other Ambulatory Visit: Payer: Self-pay | Admitting: Medical

## 2024-03-07 ENCOUNTER — Other Ambulatory Visit: Payer: Self-pay | Admitting: Internal Medicine

## 2024-03-07 MED ORDER — ZEPBOUND 10 MG/0.5ML ~~LOC~~ SOAJ: 10.0000 mg | SUBCUTANEOUS | 0 refills | Status: AC

## 2024-04-01 ENCOUNTER — Encounter: Payer: Commercial Managed Care - PPO | Admitting: Medical
